# Patient Record
Sex: Female | Born: 1970 | Race: White | Hispanic: No | Smoking: Never smoker
Health system: Southern US, Community
[De-identification: ages and names within clinical notes are randomized; demographics above are authoritative.]

## PROBLEM LIST (undated history)

## (undated) DIAGNOSIS — Z87442 Personal history of urinary calculi: Secondary | ICD-10-CM

## (undated) DIAGNOSIS — N200 Calculus of kidney: Secondary | ICD-10-CM

## (undated) DIAGNOSIS — G90529 Complex regional pain syndrome I of unspecified lower limb: Secondary | ICD-10-CM

## (undated) DIAGNOSIS — F32A Depression, unspecified: Secondary | ICD-10-CM

## (undated) DIAGNOSIS — G9050 Complex regional pain syndrome I, unspecified: Secondary | ICD-10-CM

## (undated) DIAGNOSIS — F419 Anxiety disorder, unspecified: Secondary | ICD-10-CM

## (undated) DIAGNOSIS — G43909 Migraine, unspecified, not intractable, without status migrainosus: Secondary | ICD-10-CM

## (undated) DIAGNOSIS — Z9889 Other specified postprocedural states: Secondary | ICD-10-CM

## (undated) DIAGNOSIS — T753XXA Motion sickness, initial encounter: Secondary | ICD-10-CM

## (undated) DIAGNOSIS — IMO0002 Reserved for concepts with insufficient information to code with codable children: Secondary | ICD-10-CM

## (undated) DIAGNOSIS — K219 Gastro-esophageal reflux disease without esophagitis: Secondary | ICD-10-CM

## (undated) DIAGNOSIS — I7300 Raynaud's syndrome without gangrene: Secondary | ICD-10-CM

## (undated) DIAGNOSIS — R112 Nausea with vomiting, unspecified: Secondary | ICD-10-CM

## (undated) DIAGNOSIS — M5135 Other intervertebral disc degeneration, thoracolumbar region: Secondary | ICD-10-CM

## (undated) DIAGNOSIS — S129XXA Fracture of neck, unspecified, initial encounter: Secondary | ICD-10-CM

## (undated) DIAGNOSIS — G4730 Sleep apnea, unspecified: Secondary | ICD-10-CM

## (undated) DIAGNOSIS — F4310 Post-traumatic stress disorder, unspecified: Secondary | ICD-10-CM

## (undated) DIAGNOSIS — M5481 Occipital neuralgia: Secondary | ICD-10-CM

## (undated) DIAGNOSIS — I10 Essential (primary) hypertension: Secondary | ICD-10-CM

## (undated) DIAGNOSIS — F329 Major depressive disorder, single episode, unspecified: Secondary | ICD-10-CM

## (undated) DIAGNOSIS — I499 Cardiac arrhythmia, unspecified: Secondary | ICD-10-CM

## (undated) HISTORY — DX: Complex regional pain syndrome I, unspecified: G90.50

## (undated) HISTORY — DX: Reserved for concepts with insufficient information to code with codable children: IMO0002

## (undated) HISTORY — PX: BREAST SURGERY: SHX581

## (undated) HISTORY — PX: BREAST ENHANCEMENT SURGERY: SHX7

## (undated) HISTORY — DX: Calculus of kidney: N20.0

## (undated) HISTORY — DX: Sleep apnea, unspecified: G47.30

## (undated) HISTORY — DX: Major depressive disorder, single episode, unspecified: F32.9

## (undated) HISTORY — DX: Anxiety disorder, unspecified: F41.9

## (undated) HISTORY — DX: Depression, unspecified: F32.A

## (undated) HISTORY — DX: Migraine, unspecified, not intractable, without status migrainosus: G43.909

## (undated) HISTORY — DX: Post-traumatic stress disorder, unspecified: F43.10

## (undated) HISTORY — PX: ABDOMINAL HYSTERECTOMY: SHX81

## (undated) HISTORY — PX: CHOLECYSTECTOMY: SHX55

## (undated) NOTE — *Deleted (*Deleted)
Jill Rivas    CSN: [id number] Arrival date & time: 07/16/20  1355      History   Chief Complaint Chief Complaint  Patient presents with  . Cough  . Nasal Congestion    HPI Jill Rivas is a 48 y.o. female.   HPI  Past Medical History:  Diagnosis Date  . Anxiety   . Bilateral occipital neuralgia   . Compression fracture of cervical vertebra (HCC) 2018  . CRPS (complex regional pain syndrome type I) 2019  . CRPS (complex regional pain syndrome)   . DDD (degenerative disc disease), thoracolumbar 2019  . Depression   . Dysrhythmia    tacchycardia and bradycardia  . GERD (gastroesophageal reflux disease)   . History of kidney stones   . Hypertension 2019   blood pressure elevated due to anxiety  . Kidney stones   . Migraines 2019   complicated migraines can go into seizure like activity.  inderal and sleep help.  . Motion sickness    car sick  . PONV (postoperative nausea and vomiting)    some nausea after surgery  . PTSD (post-traumatic stress disorder)   . Raynaud's disease   . Reflex sympathetic dystrophy    left foot  . Reflex sympathetic dystrophy of lower limb    foot  . Sleep apnea 2017   tested again approx 1 month ago and sleep apnea has resolved.    Patient Active Problem List   Diagnosis Date Noted  . Hypotension 02/09/2020  . Palpitations 02/09/2020  . Status post laparoscopic adhesiolysis 11/08/2017  . Vaginal bleeding 11/07/2017  . Post-operative state 10/26/2017  . Moderate episode of recurrent major depressive disorder (HCC)   . Chest pain 07/10/2017  . Bilateral occipital neuralgia 01/10/2016  . Cervical disc disorder with radiculopathy of cervical region 02/27/2015  . 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 DDD (degenerative disc disease), cervical with radiculopathy 01/23/2015  . CRPS 1 (complex regional pain syndrome I) of lower limb 01/23/2015  . DDD (degenerative disc disease), lumbosacral 01/23/2015    Past  Surgical History:  Procedure Laterality Date  . ABDOMINAL HYSTERECTOMY    . AUGMENTATION MAMMAPLASTY Bilateral 2005   SALINE  . BREAST ENHANCEMENT SURGERY Bilateral   . BREAST SURGERY    . CHOLECYSTECTOMY    . COLONOSCOPY WITH PROPOFOL N/A 10/28/2019   Procedure: COLONOSCOPY WITH PROPOFOL;  Surgeon: Wyline Mood, MD;  Location: [facility];  Service: Endoscopy;  Laterality: N/A;  . FOOT SURGERY Left 1993   to free the nerve in ankle  . 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 LAPAROSCOPY N/A 11/08/2017   Procedure: LAPAROSCOPY DIAGNOSTIC;  Surgeon: Herold Harms, MD;  Location: [facility];  Service: Gynecology;  Laterality: N/A;  . POLYPECTOMY  10/28/2019   Procedure: POLYPECTOMY;  Surgeon: Wyline Mood, MD;  Location: [facility];  Service: Endoscopy;;  . REPAIR VAGINAL CUFF N/A 10/26/2017   Procedure: REPAIR VAGINAL CUFF;  Surgeon: Linzie Collin, MD;  Location: [facility];  Service: Gynecology;  Laterality: N/A;    OB History    Gravida  0   Para  0   Term  0   Preterm  0   AB  0   Living  0     SAB  0   TAB  0   Ectopic  0   Multiple  0   Live Births  0  Home Medications    Prior to Admission medications   Medication Sig Start Date End Date Taking? Authorizing Provider  cetirizine (ZYRTEC) 10 MG tablet Take 10 mg by mouth at bedtime.  09/28/17   [provider]  diclofenac sodium (VOLTAREN) 1 % GEL Apply 2-4 g to painful area of skin 4 times per day if tolerated. Patient with history of strain, sprain, and muscle spasms of extremities 05/15/16   Ewing Schlein, MD  dicyclomine (BENTYL) 20 MG tablet Take 20 mg by mouth 4 (four) times daily as needed. 09/23/19   [provider]  DULoxetine (CYMBALTA) 30 MG capsule Take 30-60 mg by mouth See admin instructions. Take 60 mg by mouth in the morning and 30 mg by mouth at bedtime. 06/23/17   [provider]   estradiol (ESTRACE) 1 MG tablet TAKE 1 & 1/2 TABLETS (1.5 MG TOTAL) BY MOUTH DAILY. 07/18/19   Linzie Collin, MD  mirtazapine (REMERON) 15 MG tablet Take 15 mg by mouth at bedtime.    [provider]  omeprazole (PRILOSEC) 40 MG capsule TAKE 1 TABLET BY MOUTH DAILY AT BEDTIME FOR REFLUX 02/08/17   [provider]  ondansetron (ZOFRAN-ODT) 4 MG disintegrating tablet Take 4 mg by mouth every 8 (eight) hours as needed. 09/02/19   [provider]  Oxycodone HCl 10 MG TABS Limit 4-6 tablets po daily if tolerated Patient taking differently: Take 10 mg by mouth every 4 (four) hours.  05/15/16   Ewing Schlein, MD  polyethylene glycol powder (GLYCOLAX/MIRALAX) 17 GM/SCOOP powder Please take 34 grams for 7 days before your prep. Start medication on 10/16/2019 10/06/19   Pasty Spillers, MD  pravastatin (PRAVACHOL) 20 MG tablet Take 20 mg by mouth at bedtime.    [provider]  prazosin (MINIPRESS) 1 MG capsule Take 1-2 mg by mouth See admin instructions. Take 1 mg by mouth twice daily and 2 mg at bedtime    [provider]  propranolol ER (INDERAL LA) 120 MG 24 hr capsule Take 120 mg by mouth at bedtime. 09/08/19   [provider]  tiZANidine (ZANAFLEX) 4 MG tablet Take 6-8 mg by mouth See admin instructions. Take 6 mg by mouth twice daily and 8 mg at bedtime 06/24/17   [provider]  topiramate (TOPAMAX) 50 MG tablet Limit 1-2 tabs by mouth twice a day if tolerated Patient taking differently: Take 100 mg by mouth 2 (two) times daily.  04/15/16   Ewing Schlein, MD  triamcinolone (KENALOG) 0.025 % cream APPLY TO AFFECTED AREA TWICE DAILY AS NEEDED FOR ECZEMA 06/22/17   [provider]    Family History Family History  Problem Relation Age of Onset  . Cancer Mother   . Breast cancer Mother   . Colon cancer Mother   . Colon polyps Mother   . Heart attack Mother 2  . Heart attack Father 51  . Colon polyps Paternal Aunt    . Heart attack Paternal Grandfather     Social History Social History   Tobacco Use  . Smoking status: Never Smoker  . Smokeless tobacco: Never Used  Vaping Use  . Vaping Use: Never used  Substance Use Topics  . Alcohol use: No    Alcohol/week: 0.0 standard drinks  . Drug use: No     Allergies   Goat-derived products, Other, and Adhesive [tape]   Review of Systems Review of Systems   Physical Exam Triage Vital Signs ED Triage Vitals  Enc Vitals  Group     BP 07/16/20 1515 (!) 138/92     Pulse Rate 07/16/20 1515 (!) 115     Resp 07/16/20 1515 18     Temp 07/16/20 1515 98.2 F (36.8 C)     Temp Source 07/16/20 1515 Oral     SpO2 07/16/20 1515 94 %     Weight --      Height --      Head Circumference --      Peak Flow --      Pain Score 07/16/20 1514 1     Pain Loc --      Pain Edu? --      Excl. in GC? --    No data found.  Updated Vital Signs BP (!) 138/92 (BP Location: Left Arm)   Pulse (!) 115   Temp 98.2 F (36.8 C) (Oral)   Resp 18   LMP 09/30/2017 (Approximate)   SpO2 94%   Visual Acuity Right Eye Distance:   Left Eye Distance:   Bilateral Distance:    Right Eye Near:   Left Eye Near:    Bilateral Near:     Physical Exam   UC Treatments / Results  Labs (all labs ordered are listed, but only abnormal results are displayed) Labs Reviewed - No data to display  EKG   Radiology No results found.  Procedures Procedures (including critical care time)  Medications Ordered in UC Medications - No data to display  Initial Impression / Assessment and Plan / UC Course  I have reviewed the triage vital signs and the nursing notes.  Pertinent labs & imaging results that were available during my care of the patient were reviewed by me and considered in my medical decision making (see chart for details).     *** Final Clinical Impressions(s) / UC Diagnoses   Final diagnoses:  Upper respiratory tract infection, unspecified type   Acute bronchitis, unspecified organism  Cough  Wheezing  Other fatigue   Discharge Instructions   None    ED Prescriptions    None     PDMP not reviewed this encounter.

---

## 1991-09-23 HISTORY — PX: FOOT SURGERY: SHX648

## 2003-09-23 HISTORY — PX: AUGMENTATION MAMMAPLASTY: SUR837

## 2003-09-23 HISTORY — PX: KIDNEY SURGERY: SHX687

## 2004-06-10 ENCOUNTER — Other Ambulatory Visit: Payer: Self-pay

## 2006-02-27 ENCOUNTER — Ambulatory Visit: Payer: Self-pay | Admitting: Family Medicine

## 2006-03-16 ENCOUNTER — Ambulatory Visit: Payer: Self-pay | Admitting: Family Medicine

## 2006-06-02 ENCOUNTER — Ambulatory Visit: Payer: Self-pay | Admitting: Pain Medicine

## 2006-06-08 ENCOUNTER — Ambulatory Visit: Payer: Self-pay | Admitting: Pain Medicine

## 2006-07-14 ENCOUNTER — Ambulatory Visit: Payer: Self-pay | Admitting: Pain Medicine

## 2006-07-20 ENCOUNTER — Ambulatory Visit: Payer: Self-pay | Admitting: Pain Medicine

## 2006-08-25 ENCOUNTER — Ambulatory Visit: Payer: Self-pay | Admitting: Pain Medicine

## 2006-08-31 ENCOUNTER — Ambulatory Visit: Payer: Self-pay | Admitting: Pain Medicine

## 2009-11-16 ENCOUNTER — Ambulatory Visit: Payer: Self-pay | Admitting: Internal Medicine

## 2010-02-21 ENCOUNTER — Ambulatory Visit: Payer: Self-pay | Admitting: Family Medicine

## 2010-04-27 ENCOUNTER — Emergency Department: Payer: Self-pay | Admitting: Emergency Medicine

## 2010-04-29 ENCOUNTER — Emergency Department: Payer: Self-pay | Admitting: Emergency Medicine

## 2011-12-11 ENCOUNTER — Ambulatory Visit: Payer: Self-pay | Admitting: Pain Medicine

## 2011-12-29 ENCOUNTER — Ambulatory Visit: Payer: Self-pay | Admitting: Pain Medicine

## 2012-01-08 ENCOUNTER — Ambulatory Visit: Payer: Self-pay | Admitting: Pain Medicine

## 2012-01-14 ENCOUNTER — Ambulatory Visit: Payer: Self-pay | Admitting: Pain Medicine

## 2012-02-05 ENCOUNTER — Ambulatory Visit: Payer: Self-pay | Admitting: Pain Medicine

## 2012-02-11 ENCOUNTER — Ambulatory Visit: Payer: Self-pay | Admitting: Pain Medicine

## 2012-03-09 ENCOUNTER — Ambulatory Visit: Payer: Self-pay | Admitting: Pain Medicine

## 2012-03-15 ENCOUNTER — Ambulatory Visit: Payer: Self-pay | Admitting: Pain Medicine

## 2012-04-07 ENCOUNTER — Ambulatory Visit: Payer: Self-pay | Admitting: Gastroenterology

## 2012-04-20 ENCOUNTER — Ambulatory Visit: Payer: Self-pay | Admitting: Pain Medicine

## 2012-04-28 ENCOUNTER — Ambulatory Visit: Payer: Self-pay | Admitting: Pain Medicine

## 2012-05-18 ENCOUNTER — Ambulatory Visit: Payer: Self-pay | Admitting: Pain Medicine

## 2012-05-26 ENCOUNTER — Ambulatory Visit: Payer: Self-pay | Admitting: Pain Medicine

## 2012-06-15 ENCOUNTER — Ambulatory Visit: Payer: Self-pay | Admitting: Pain Medicine

## 2012-07-15 ENCOUNTER — Ambulatory Visit: Payer: Self-pay | Admitting: Pain Medicine

## 2012-07-26 ENCOUNTER — Ambulatory Visit: Payer: Self-pay | Admitting: Pain Medicine

## 2012-08-12 ENCOUNTER — Ambulatory Visit: Payer: Self-pay | Admitting: Pain Medicine

## 2012-09-13 ENCOUNTER — Ambulatory Visit: Payer: Self-pay | Admitting: Pain Medicine

## 2012-10-26 ENCOUNTER — Ambulatory Visit: Payer: Self-pay | Admitting: Pain Medicine

## 2012-12-28 ENCOUNTER — Ambulatory Visit: Payer: Self-pay | Admitting: Pain Medicine

## 2013-01-05 ENCOUNTER — Ambulatory Visit: Payer: Self-pay | Admitting: Pain Medicine

## 2013-01-25 ENCOUNTER — Ambulatory Visit: Payer: Self-pay | Admitting: Pain Medicine

## 2013-02-09 ENCOUNTER — Ambulatory Visit: Payer: Self-pay | Admitting: Pain Medicine

## 2013-02-24 ENCOUNTER — Ambulatory Visit: Payer: Self-pay | Admitting: Pain Medicine

## 2013-03-24 ENCOUNTER — Ambulatory Visit: Payer: Self-pay | Admitting: Pain Medicine

## 2013-03-30 ENCOUNTER — Ambulatory Visit: Payer: Self-pay | Admitting: Pain Medicine

## 2013-04-26 ENCOUNTER — Ambulatory Visit: Payer: Self-pay | Admitting: Pain Medicine

## 2013-05-26 ENCOUNTER — Ambulatory Visit: Payer: Self-pay | Admitting: Pain Medicine

## 2013-06-06 ENCOUNTER — Ambulatory Visit: Payer: Self-pay | Admitting: Pain Medicine

## 2013-06-23 ENCOUNTER — Ambulatory Visit: Payer: Self-pay | Admitting: Pain Medicine

## 2013-07-26 ENCOUNTER — Ambulatory Visit: Payer: Self-pay | Admitting: Pain Medicine

## 2013-08-31 ENCOUNTER — Ambulatory Visit: Payer: Self-pay | Admitting: Pain Medicine

## 2013-09-26 ENCOUNTER — Ambulatory Visit: Payer: Self-pay | Admitting: Pain Medicine

## 2013-10-19 ENCOUNTER — Ambulatory Visit: Payer: Self-pay | Admitting: Pain Medicine

## 2013-11-02 ENCOUNTER — Ambulatory Visit: Payer: Self-pay | Admitting: Pain Medicine

## 2013-11-07 ENCOUNTER — Ambulatory Visit: Payer: Self-pay | Admitting: Pain Medicine

## 2013-12-01 ENCOUNTER — Ambulatory Visit: Payer: Self-pay | Admitting: Pain Medicine

## 2014-01-04 ENCOUNTER — Ambulatory Visit: Payer: Self-pay | Admitting: Pain Medicine

## 2014-01-11 ENCOUNTER — Ambulatory Visit: Payer: Self-pay | Admitting: Pain Medicine

## 2014-01-31 ENCOUNTER — Ambulatory Visit: Payer: Self-pay | Admitting: Pain Medicine

## 2014-02-06 ENCOUNTER — Ambulatory Visit: Payer: Self-pay | Admitting: Pain Medicine

## 2014-03-01 ENCOUNTER — Ambulatory Visit: Payer: Self-pay | Admitting: Pain Medicine

## 2014-03-22 ENCOUNTER — Ambulatory Visit: Payer: Self-pay | Admitting: Pain Medicine

## 2014-03-30 ENCOUNTER — Ambulatory Visit: Payer: Self-pay | Admitting: Pain Medicine

## 2014-05-01 ENCOUNTER — Ambulatory Visit: Payer: Self-pay | Admitting: Pain Medicine

## 2014-05-23 ENCOUNTER — Ambulatory Visit: Payer: Self-pay | Admitting: Pain Medicine

## 2014-06-05 ENCOUNTER — Ambulatory Visit: Payer: Self-pay | Admitting: Pain Medicine

## 2014-06-06 ENCOUNTER — Ambulatory Visit: Payer: Self-pay | Admitting: Family Medicine

## 2014-06-26 ENCOUNTER — Ambulatory Visit: Payer: Self-pay | Admitting: Pain Medicine

## 2014-07-05 ENCOUNTER — Ambulatory Visit: Payer: Self-pay | Admitting: Pain Medicine

## 2014-07-27 ENCOUNTER — Ambulatory Visit: Payer: Self-pay | Admitting: Pain Medicine

## 2014-08-28 ENCOUNTER — Ambulatory Visit: Payer: Self-pay | Admitting: Pain Medicine

## 2014-09-26 ENCOUNTER — Ambulatory Visit: Payer: Self-pay | Admitting: Pain Medicine

## 2014-10-04 ENCOUNTER — Ambulatory Visit: Payer: Self-pay | Admitting: Pain Medicine

## 2014-10-26 ENCOUNTER — Ambulatory Visit: Payer: Self-pay | Admitting: Pain Medicine

## 2014-11-22 ENCOUNTER — Ambulatory Visit: Payer: Self-pay | Admitting: Pain Medicine

## 2014-12-26 ENCOUNTER — Ambulatory Visit
Admit: 2014-12-26 | Disposition: A | Discharge status: Home / Self Care | Payer: Self-pay | Attending: Pain Medicine | Admitting: Pain Medicine

## 2015-01-09 ENCOUNTER — Ambulatory Visit
Admit: 2015-01-09 | Disposition: A | Discharge status: Home / Self Care | Payer: Self-pay | Attending: Pain Medicine | Admitting: Pain Medicine

## 2015-01-23 ENCOUNTER — Encounter: Payer: Self-pay | Admitting: Pain Medicine

## 2015-01-23 ENCOUNTER — Encounter (INDEPENDENT_AMBULATORY_CARE_PROVIDER_SITE_OTHER): Payer: Self-pay

## 2015-01-23 ENCOUNTER — Ambulatory Visit: Payer: Medicare Other | Attending: Pain Medicine | Admitting: Pain Medicine

## 2015-01-23 VITALS — BP 111/82 | HR 88 | Temp 98.1°F | Resp 16 | Ht 67.0 in | Wt 192.0 lb

## 2015-01-23 DIAGNOSIS — G90529 Complex regional pain syndrome I of unspecified lower limb: Secondary | ICD-10-CM | POA: Diagnosis not present

## 2015-01-23 DIAGNOSIS — G90522 Complex regional pain syndrome I of left lower limb: Secondary | ICD-10-CM

## 2015-01-23 DIAGNOSIS — M542 Cervicalgia: Secondary | ICD-10-CM | POA: Insufficient documentation

## 2015-01-23 DIAGNOSIS — M5137 Other intervertebral disc degeneration, lumbosacral region: Secondary | ICD-10-CM

## 2015-01-23 DIAGNOSIS — M545 Low back pain: Secondary | ICD-10-CM | POA: Diagnosis present

## 2015-01-23 DIAGNOSIS — M51379 Other intervertebral disc degeneration, lumbosacral region without mention of lumbar back pain or lower extremity pain: Secondary | ICD-10-CM | POA: Insufficient documentation

## 2015-01-23 DIAGNOSIS — M5030 Other cervical disc degeneration, unspecified cervical region: Secondary | ICD-10-CM

## 2015-01-23 MED ORDER — TOPIRAMATE 50 MG PO TABS: 50.0000 mg | ORAL_TABLET | Freq: Two times a day (BID) | ORAL | Status: DC

## 2015-01-23 MED ORDER — OXYCODONE HCL 10 MG PO TABS: ORAL_TABLET | ORAL | Status: DC

## 2015-01-23 NOTE — Patient Instructions (Signed)
Epidural Steroid Injection Patient Information  Description: The epidural space surrounds the nerves as they exit the spinal cord.  In some patients, the nerves can be compressed and inflamed by a bulging disc or a tight spinal canal (spinal stenosis).  By injecting steroids into the epidural space, we can bring irritated nerves into direct contact with a potentially helpful medication.  These steroids act directly on the irritated nerves and can reduce swelling and inflammation which often leads to decreased pain.  Epidural steroids may be injected anywhere along the spine and from the neck to the low back depending upon the location of your pain.   After numbing the skin with local anesthetic (like Novocaine), a small needle is passed into the epidural space slowly.  You may experience a sensation of pressure while this is being done.  The entire block usually last less than 10 minutes.  Conditions which may be treated by epidural steroids:   Low back and leg pain  Neck and arm pain  Spinal stenosis  Post-laminectomy syndrome  Herpes zoster (shingles) pain  Pain from compression fractures  Preparation for the injection:  1. Do not eat any solid food or dairy products within 6 hours of your appointment.  2. You may drink clear liquids up to 2 hours before appointment.  Clear liquids include water, black coffee, juice or soda.  No milk or cream please. 3. You may take your regular medication, including pain medications, with a sip of water before your appointment  Diabetics should hold regular insulin (if taken separately) and take 1/2 normal NPH dos the morning of the procedure.  Carry some sugar containing items with you to your appointment. 4. A driver must accompany you and be prepared to drive you home after your procedure.  5. Bring all your current medications with your. 6. An IV may be inserted and sedation may be given at the discretion of the physician.   7. A blood pressure  cuff, EKG and other monitors will often be applied during the procedure.  Some patients may need to have extra oxygen administered for a short period. 8. You will be asked to provide medical information, including your allergies, prior to the procedure.  We must know immediately if you are taking blood thinners (like Coumadin/Warfarin)  Or if you are allergic to IV iodine contrast (dye). We must know if you could possible be pregnant.  Possible side-effects:  Bleeding from needle site  Infection (rare, may require surgery)  Nerve injury (rare)  Numbness & tingling (temporary)  Difficulty urinating (rare, temporary)  Spinal headache ( a headache worse with upright posture)  Light -headedness (temporary)  Pain at injection site (several days)  Decreased blood pressure (temporary)  Weakness in arm/leg (temporary)  Pressure sensation in back/neck (temporary)  Call if you experience:  Fever/chills associated with headache or increased back/neck pain.  Headache worsened by an upright position.  New onset weakness or numbness of an extremity below the injection site  Hives or difficulty breathing (go to the emergency room)  Inflammation or drainage at the infection site  Severe back/neck pain  Any new symptoms which are concerning to you  Teach back 3  Oxycodone script given  Topamax script sent to pharmacy  Please note:  Although the local anesthetic injected can often make your back or neck feel good for several hours after the injection, the pain will likely return.  It takes 3-7 days for steroids to work in the epidural space.  You may not notice any pain relief for at least that one week.  If effective, we will often do a series of three injections spaced 3-6 weeks apart to maximally decrease your pain.  After the initial series, we generally will wait several months before considering a repeat injection of the same type.  If you have any questions, please call  [phone] [facility]

## 2015-01-23 NOTE — Progress Notes (Signed)
   Subjective:    Patient ID: Jill Rivas, female    DOB: Feb 08, 1971, 44 y.o.   MRN: [id number]  HPI  Patient is a 44 year old female returns to pain management Center for follow-up evaluation and treatment of lower back lower extremity pain felt to be due to component of complex regional pain syndrome. At the present time patient has complaint of pain of the cervical region and upper extremity region with questionable weakness of the upper extremity. We have discussed patient undergoing cervical epidural steroid injection and a neurosurgical evaluation. We will proceed with cervical epidural steroid injection and have scheduled neurosurgical evaluation the patient is understanding and wishes to proceed with cervical epidural steroid injection at time return appointment in attempt to decrease severity of symptoms and minimize progression of her condition  Review of Systems     Objective:   Physical Exam Physical examination revealed tenderness to palpation of the cervical paraspinal musculature region with limited range of motion of the cervical spine and questionably decreased grip strength there was no excessive tends to palpation of the acromioclavicular O glenohumeral joint region. There was tenderness over the thoracic paraspinal musculature region of mild degree. With no crepitus of the thoracic region noted there was tenderness to palpation of the paraspinal musculature region of the lumbar region of mild degree. There was mild tenderness over the lumbar facet and lumbar paraspinal musculature region. The patient was with allodynia of the left lower extremity with a slight cyanotic hue of the left lower extremity noted as well. No costovertebral angle tenderness was noted. No costovertebral angle tenderness was noted no abdominal tenderness was noted.       Assessment & Plan:  Patient with pain of the cervical region with findings suggestive of cervical radiculopathy with MRI  findings of disc protrusion and foraminal narrowing we'll proceed with cervical epidural steroid injection at time return appointment. Patient with lumbar and lower extremity pain with lower extremity pain felt to be due to complex regional pain syndrome. Plan #1 continue oxycodone and Topamax Plan #2 will proceed with cervical epidural steroid injection at time return appointment in attempt to decrease severity of symptoms minimize progression of patient's symptoms and hopefully avoid the need for more involved treatment Plan #3 patient is to follow-up with primary care physician is discussed Plan #4 neurosurgical evaluation scheduled Plan #5 may consider radiofrequency rhizolysis and other treatment pending follow-up evaluation M response to present treatment Plan #6 patient to call pain management Center prior to scheduled return appointment should they be significant change in condition

## 2015-01-24 NOTE — Addendum Note (Signed)
Addended by: Morley Kos on: 01/24/2015 04:11 PM   Modules accepted: Level of Service

## 2015-02-04 ENCOUNTER — Other Ambulatory Visit: Payer: Self-pay | Admitting: Pain Medicine

## 2015-02-04 ENCOUNTER — Encounter: Payer: Self-pay | Admitting: Pain Medicine

## 2015-02-04 DIAGNOSIS — M5030 Other cervical disc degeneration, unspecified cervical region: Secondary | ICD-10-CM

## 2015-02-04 DIAGNOSIS — M5010 Cervical disc disorder with radiculopathy, unspecified cervical region: Secondary | ICD-10-CM

## 2015-02-05 ENCOUNTER — Encounter: Payer: Self-pay | Admitting: Pain Medicine

## 2015-02-05 ENCOUNTER — Ambulatory Visit: Payer: Medicare Other | Attending: Pain Medicine | Admitting: Pain Medicine

## 2015-02-05 VITALS — BP 96/34 | HR 73 | Temp 98.6°F | Resp 16 | Ht 67.0 in | Wt 192.0 lb

## 2015-02-05 DIAGNOSIS — M542 Cervicalgia: Secondary | ICD-10-CM | POA: Diagnosis present

## 2015-02-05 DIAGNOSIS — M5030 Other cervical disc degeneration, unspecified cervical region: Secondary | ICD-10-CM | POA: Diagnosis not present

## 2015-02-05 DIAGNOSIS — M79601 Pain in right arm: Secondary | ICD-10-CM | POA: Insufficient documentation

## 2015-02-05 DIAGNOSIS — M5010 Cervical disc disorder with radiculopathy, unspecified cervical region: Secondary | ICD-10-CM

## 2015-02-05 DIAGNOSIS — M79602 Pain in left arm: Secondary | ICD-10-CM | POA: Diagnosis present

## 2015-02-05 MED ORDER — FENTANYL CITRATE (PF) 100 MCG/2ML IJ SOLN
INTRAMUSCULAR | Status: AC
  Administered 2015-02-05: 100 ug via INTRAVENOUS
  Administered 2015-02-05: 14:00:00
  Filled 2015-02-05: qty 2

## 2015-02-05 MED ORDER — TRIAMCINOLONE ACETONIDE 40 MG/ML IJ SUSP
INTRAMUSCULAR | Status: AC
  Administered 2015-02-05: 40 mg
  Filled 2015-02-05: qty 1

## 2015-02-05 MED ORDER — ORPHENADRINE CITRATE 30 MG/ML IJ SOLN
INTRAMUSCULAR | Status: AC
  Administered 2015-02-05: 60 mg
  Filled 2015-02-05: qty 2

## 2015-02-05 MED ORDER — MIDAZOLAM HCL 5 MG/5ML IJ SOLN
INTRAMUSCULAR | Status: AC
  Administered 2015-02-05: 2 mg via INTRAVENOUS
  Filled 2015-02-05: qty 5

## 2015-02-05 MED ORDER — SODIUM CHLORIDE 0.9 % IJ SOLN
INTRAMUSCULAR | Status: AC
  Administered 2015-02-05: 20 mL
  Filled 2015-02-05: qty 20

## 2015-02-05 MED ORDER — LIDOCAINE HCL (PF) 1 % IJ SOLN
INTRAMUSCULAR | Status: AC
  Administered 2015-02-05: 14:00:00
  Filled 2015-02-05: qty 5

## 2015-02-05 MED ORDER — BUPIVACAINE HCL (PF) 0.25 % IJ SOLN
INTRAMUSCULAR | Status: AC
  Administered 2015-02-05: 14:00:00
  Filled 2015-02-05: qty 30

## 2015-02-05 NOTE — Progress Notes (Signed)
Discharge to home via wheelchair with [place]. Instruction given. Verbalizes understanding. Tolerating liquids.

## 2015-02-05 NOTE — Progress Notes (Signed)
   Subjective:    Patient ID: Jill Rivas, female    DOB: [birth year], 44 y.o.   MRN: [id number]  HPI    Review of Systems     Objective:   Physical Exam        Assessment & Plan:

## 2015-02-05 NOTE — Progress Notes (Signed)
PROCEDURE PERFORMED: Cervical epidural steroid injection   NOTE: The patient is a 44 y.o.-year-old female who returns to [facility] for further evaluation and prescription of pain involving the neck and upper extremity region. Cervical MRI has revealed the patient to be with evidence of degenerative changes of the cervical spine most notable at C5-6 with foraminal narrowing and other changes.. The risks, benefits, and expectations of the procedure have been discussed and explained to the patient who was understanding and wished to proceed with interventional treatment as planned. Will proceed with interventional treatment as discussed and as explained to the patient.  DESCRIPTION OF PROCEDURE: Cervical epidural steroid injection with IV Versed, IV fentanyl conscious sedation, EKG, blood pressure, pulse, and pulse oximetry monitoring. The procedure was performed with the patient in the prone position under fluoroscopic guidance. With AP view of the cervical spine, the patient in the prone position, Betadine prep of proposed entry site was performed. Following local anesthetic skin wheal of 1.5% plain lidocaine of proposed needle entry site, 18-gauge Tuohy epidural needle inserted via hanging drop technique at the C 6 vertebral body level with needle placed left of the midline under fluoroscopic guidance. Following needle placement under fluoroscopic guidance, a total of 4 mL of Preservative-Free normal saline with 40 mg of Kenalog injected incrementally via cervical epidural placed needle. Needle removed. The patient tolerated the injection well.   PLAN:   1. Medications: Will continue presently prescribed medications consisting of Topamax and oxycodone. 2. Will consider modification of treatment regimen at time of return appointment pending response to treatment rendered on today's visit. 3. The patient is to follow-up with primary care physician, Dr. Delight Stare, for evaluation of blood  pressure and general medical condition status post steroid injection performed on today's visit. 4. Surgical evaluation as scheduled. 5. Neurological evaluation as discussed 6    The patient is to call pain management for any change in condition prior to return appointment     6. May consider radiofrequency procedure, implantation device, and other treatment pending response to treatment and follow-up evaluation. 7. The patient has been advised to adhere to proper body mechanics and avoid activities which appear to aggravate condition. 8. The patient has been advised to call the Pain Management Center prior to scheduled return appointment should there be significant change in condition or should patient have other concerns regarding condition prior to scheduled return appointment.  The patient is understanding and in agreement with suggested treatment plan.

## 2015-02-05 NOTE — Patient Instructions (Addendum)
Continue present medications and antibiotics.  F/U PCP for evaliation of  BP and general medical  condition.  Nurse's please schedule neurosurgical eval of pain of the cervical region and upper extremity pain and weakness  F/U surgical evaluation.  F/U nrurological evaluation.  May consider radiofrequency rhizolysis or intraspinal procedures pending response to present treatment and F/U evaluation.  Patient to call Pain Management Center should patient have concerns prior to scheduled return appointment.    Pain Management Discharge Instructions  General Discharge Instructions :  If you need to reach your doctor call: Monday-Friday 8:00 am - 4:00 pm at [phone] or toll free [phone].  After clinic hours [phone] to have operator reach doctor.  Bring all of your medication bottles to all your appointments in the pain clinic.  To cancel or reschedule your appointment with Pain Management please remember to call 24 hours in advance to avoid a fee.  Refer to the educational materials which you have been given on: General Risks, I had my Procedure. Discharge Instructions, Post Sedation.  Post Procedure Instructions:  The drugs you were given will stay in your system until tomorrow, so for the next 24 hours you should not drive, make any legal decisions or drink any alcoholic beverages.  You may eat anything you prefer, but it is better to start with liquids then soups and crackers, and gradually work up to solid foods.  Please notify your doctor immediately if you have any unusual bleeding, trouble breathing or pain that is not related to your normal pain.  Depending on the type of procedure that was done, some parts of your body may feel week and/or numb.  This usually clears up by tonight or the next day.  Walk with the use of an assistive device or accompanied by an adult for the 24 hours.  You may use ice on the affected area for the first 24 hours.  Put ice in a  Ziploc bag and cover with a towel and place against area 15 minutes on 15 minutes off.  You may switch to heat after 24 hours.

## 2015-02-05 NOTE — Progress Notes (Signed)
1224 Procedure start time 1234 Procedure end time Report to D. Merrilyn Puma

## 2015-02-05 NOTE — Progress Notes (Signed)
   Subjective:    Patient ID: Jill Rivas, female    DOB: 1971-09-11, 44 y.o.   MRN: [id number]  HPI    Review of Systems     Objective:   Physical Exam        Assessment & Plan:

## 2015-02-05 NOTE — Progress Notes (Signed)
Discharged via w/c at 1:15 pm. Tolerating po fluids well. Instructions (verbal and written) given to patient. Teachback x3.

## 2015-02-06 ENCOUNTER — Telehealth: Payer: Self-pay | Admitting: *Deleted

## 2015-02-06 NOTE — Telephone Encounter (Signed)
Left message to call for any concerns or problems

## 2015-02-06 NOTE — Telephone Encounter (Signed)
closed

## 2015-02-08 ENCOUNTER — Telehealth: Payer: Self-pay | Admitting: Pain Medicine

## 2015-02-08 NOTE — Telephone Encounter (Signed)
Inquiring about Neuro Surgeon Appt.

## 2015-02-20 ENCOUNTER — Telehealth: Payer: Self-pay | Admitting: Pain Medicine

## 2015-02-20 NOTE — Telephone Encounter (Signed)
Inquiring about Neuro referral / she hasn't gotten a call

## 2015-02-25 ENCOUNTER — Other Ambulatory Visit: Payer: Self-pay | Admitting: Pain Medicine

## 2015-02-25 DIAGNOSIS — G90522 Complex regional pain syndrome I of left lower limb: Secondary | ICD-10-CM

## 2015-02-25 DIAGNOSIS — M5030 Other cervical disc degeneration, unspecified cervical region: Secondary | ICD-10-CM

## 2015-02-25 DIAGNOSIS — M5137 Other intervertebral disc degeneration, lumbosacral region: Secondary | ICD-10-CM

## 2015-02-25 DIAGNOSIS — M533 Sacrococcygeal disorders, not elsewhere classified: Secondary | ICD-10-CM | POA: Insufficient documentation

## 2015-02-27 ENCOUNTER — Encounter: Payer: Self-pay | Admitting: Pain Medicine

## 2015-02-27 ENCOUNTER — Ambulatory Visit: Payer: Medicare Other | Attending: Pain Medicine | Admitting: Pain Medicine

## 2015-02-27 VITALS — BP 130/93 | HR 98 | Temp 97.8°F | Resp 18 | Ht 67.0 in | Wt 192.0 lb

## 2015-02-27 DIAGNOSIS — M5412 Radiculopathy, cervical region: Secondary | ICD-10-CM | POA: Diagnosis not present

## 2015-02-27 DIAGNOSIS — M5030 Other cervical disc degeneration, unspecified cervical region: Secondary | ICD-10-CM

## 2015-02-27 DIAGNOSIS — M5010 Cervical disc disorder with radiculopathy, unspecified cervical region: Secondary | ICD-10-CM

## 2015-02-27 DIAGNOSIS — M79602 Pain in left arm: Secondary | ICD-10-CM | POA: Diagnosis present

## 2015-02-27 DIAGNOSIS — G90522 Complex regional pain syndrome I of left lower limb: Secondary | ICD-10-CM | POA: Diagnosis not present

## 2015-02-27 DIAGNOSIS — Z9889 Other specified postprocedural states: Secondary | ICD-10-CM | POA: Diagnosis not present

## 2015-02-27 DIAGNOSIS — M5137 Other intervertebral disc degeneration, lumbosacral region: Secondary | ICD-10-CM

## 2015-02-27 DIAGNOSIS — M533 Sacrococcygeal disorders, not elsewhere classified: Secondary | ICD-10-CM

## 2015-02-27 DIAGNOSIS — M542 Cervicalgia: Secondary | ICD-10-CM | POA: Diagnosis present

## 2015-02-27 DIAGNOSIS — M79601 Pain in right arm: Secondary | ICD-10-CM | POA: Diagnosis present

## 2015-02-27 MED ORDER — TOPIRAMATE 50 MG PO TABS: ORAL_TABLET | ORAL | Status: DC

## 2015-02-27 MED ORDER — OXYCODONE HCL 10 MG PO TABS: ORAL_TABLET | ORAL | Status: DC

## 2015-02-27 NOTE — Progress Notes (Signed)
Safety precautions to be maintained throughout the outpatient stay will include: orient to surroundings, keep bed in low position, maintain call bell within reach at all times, provide assistance with transfer out of bed and ambulation.  

## 2015-02-27 NOTE — Progress Notes (Signed)
   Subjective:    Patient ID: Jill Rivas, female    DOB: March 24, 1971, 44 y.o.   MRN: [id number]  HPI  Patient is 44 year old female returns to [place] for further evaluation and treatment of pain involving the neck and upper extremity regions associated with weakness of the left upper extremity. Patient had improvement of her pain following previous cervical epidural steroid injection. On today's visit we informed patient wishes to avoid interventional treatment and have patient undergo's neurosurgical evaluation of her pain of the cervical region and upper extremity region associated with weakness. Patient was understanding and agreement status treatment plan. Patient states that her lower extremity pain felt to be component of complex regional pain syndrome is fairly well-controlled at this time. For neurosurgical evaluation of pain of the cervical and upper extremity region with upper extremity weakness as discussed and explained to patient today's visit. All understanding and agrees to treatment plan    Review of Systems     Objective:   Physical Exam  There was tends to palpation spends And occipitalis musculature regions of moderate degree with interventional range of motion of the cervical spine noted. No definite findings were noted with Spurling's maneuver. There appeared to be decreased grip strength of significant degree on the left compared to the right there was minimal tenderness of the acromioclavicular glenohumeral joint region. Tinel and Phalen's maneuver without without increased pain of any significant degree. Palpation of the thoracic facet thoracic paraspinal musculature region was associated tends to palpation of moderate degree.  Moderate muscle spasms noted upper mid and lower thoracic regions. Palpation of the lumbar paraspinal musculature region lumbar facet region associated regions palpation of moderate degree with lateral bending and rotations to  palpation lumbar facets reproducing moderate discomfort. There was tends to palpation of the left lower extremity with slight/cyanotic you of the left foot with areas of hypersensitivity to touch suggestive of allodynia. There was decreased EHL strength on the left compared to the right. Abdomen nontender. No costovertebral maintenance noted.      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 Oxycodone and Topamax  F/U PCP for evaliation of  BP and general medical  condition.  F/U surgical evaluation. Neurosurgical reevaluation scheduled for evaluation of pain of the cervical upper extremity region associated with upper extremity weakness especially on the left  F/U neurological evaluation.  May consider radiofrequency rhizolysis or intraspinal procedures pending response to present treatment and F/U evaluation.  Patient to call Pain Management Center should patient have concerns prior to scheduled return appointment.

## 2015-02-27 NOTE — Patient Instructions (Signed)
Continue present medications.  F/U PCP for evaliation of  BP and general medical  condition.  F/U surgical evaluation. Please take copy of MRI report as well as MRI disc of your neck to evaluation with neurosurgeon. We also scheduling you for neurosurgical evaluation at this time   F/U neurological evaluation.  May consider radiofrequency rhizolysis or intraspinal procedures pending response to present treatment and F/U evaluation.  Patient to call Pain Management Center should patient have concerns prior to scheduled return appointment.

## 2015-03-06 ENCOUNTER — Ambulatory Visit: Payer: Medicare Other | Admitting: Pain Medicine

## 2015-03-14 ENCOUNTER — Other Ambulatory Visit: Payer: Self-pay | Admitting: Family Medicine

## 2015-03-14 DIAGNOSIS — Z1231 Encounter for screening mammogram for malignant neoplasm of breast: Secondary | ICD-10-CM

## 2015-03-22 ENCOUNTER — Ambulatory Visit: Payer: Medicare Other | Attending: Family Medicine

## 2015-03-27 ENCOUNTER — Ambulatory Visit: Payer: Medicare Other | Attending: Internal Medicine

## 2015-03-27 DIAGNOSIS — G4730 Sleep apnea, unspecified: Secondary | ICD-10-CM | POA: Insufficient documentation

## 2015-03-29 ENCOUNTER — Ambulatory Visit: Payer: Medicare Other | Attending: Pain Medicine | Admitting: Pain Medicine

## 2015-03-29 ENCOUNTER — Encounter: Payer: Self-pay | Admitting: Pain Medicine

## 2015-03-29 VITALS — BP 92/69 | HR 83 | Temp 97.9°F | Resp 16 | Ht 67.0 in | Wt 195.0 lb

## 2015-03-29 DIAGNOSIS — M79601 Pain in right arm: Secondary | ICD-10-CM | POA: Diagnosis present

## 2015-03-29 DIAGNOSIS — M533 Sacrococcygeal disorders, not elsewhere classified: Secondary | ICD-10-CM

## 2015-03-29 DIAGNOSIS — M5481 Occipital neuralgia: Secondary | ICD-10-CM | POA: Insufficient documentation

## 2015-03-29 DIAGNOSIS — M79602 Pain in left arm: Secondary | ICD-10-CM | POA: Diagnosis present

## 2015-03-29 DIAGNOSIS — M5137 Other intervertebral disc degeneration, lumbosacral region: Secondary | ICD-10-CM

## 2015-03-29 DIAGNOSIS — M47812 Spondylosis without myelopathy or radiculopathy, cervical region: Secondary | ICD-10-CM | POA: Diagnosis not present

## 2015-03-29 DIAGNOSIS — M5010 Cervical disc disorder with radiculopathy, unspecified cervical region: Secondary | ICD-10-CM

## 2015-03-29 DIAGNOSIS — M5030 Other cervical disc degeneration, unspecified cervical region: Secondary | ICD-10-CM | POA: Diagnosis not present

## 2015-03-29 DIAGNOSIS — G90522 Complex regional pain syndrome I of left lower limb: Secondary | ICD-10-CM

## 2015-03-29 MED ORDER — TOPIRAMATE 50 MG PO TABS: ORAL_TABLET | ORAL | Status: DC

## 2015-03-29 MED ORDER — OXYCODONE HCL 10 MG PO TABS: ORAL_TABLET | ORAL | Status: DC

## 2015-03-29 NOTE — Progress Notes (Signed)
   Subjective:    Patient ID: Jill Rivas, female    DOB: 1971/03/16, 44 y.o.   MRN: [id number]  HPI  Patient is 44 year old female returns to [place] for further evaluation and treatment of pain involving the region of the upper extremity region. Patient is status post neurosurgical evaluation and neurosurgeon recommended patient return to pain management to undergo additional injections at this time. We will schedule cervical epidural steroid injection to be performed at time return appointment as recommended by neurosurgeon. The patient was with understanding and agreement with suggested treatment plan.       Review of Systems     Objective:   Physical Exam  There was tends to palpation of the splenius capitis and occipitalis musculature region. Range of motion maneuvers of the cervical spine associated with radiation of pain into the upper extremities questionable degree. There was questionable decreased sensation of the C5-6 dermatomal distribution of the left upper extremity. Mild tinnitus of the acromioclavicular glenohumeral joint region was noted. Palpation over the thoracic facet thoracic paraspinal musculature region was evidence of muscle spasms and upper thoracic region especially. Palpation of the lumbar paraspinal muscles lumbar facet region associated with mild to moderate discomfort. Left lower extremity was with allodynia. And with cyanotic hue. No sensory deficit of dermatomal distribution of the lower extremities noted. There was negative clonus negative Homans of the right lower extremity. The abdomen was nontender with no costovertebral tenderness noted      Assessment & Plan:  Degenerative disc disease cervical spine Degenerative changes cervical spine most notable at the C5-6 level  Complex regional pain syndrome of left lower extremity  Sacroiliac joint dysfunction  Bilateral occipital neuralgia   Plan  Continue present medications  Topamax oxycodone  Cervical epidural steroid injection to be performed at time return appointment  F/U PCP for evaliation of  BP and general medical  condition.  F/U surgical evaluation with neurosurgeon as planned  F/U neurological evaluation  May consider radiofrequency rhizolysis or intraspinal procedures pending response to present treatment and F/U evaluation.  Patient to call Pain Management Center should patient have concerns prior to scheduled return appointment.

## 2015-03-29 NOTE — Patient Instructions (Addendum)
Continue present medications Topamax and oxycodone  Cervical epidural steroid injection to be performed at time of return appointment Wednesday, 04/11/2015  F/U PCP for evaliation of  BP and general medical  condition. See primary care physician regarding blood pressure  F/U surgical evaluation. Follow-up with neurosurgeon as discussed. We will proceed with injections as recommended by the neurosurgeon   F/U neurological evaluation  May consider radiofrequency rhizolysis or intraspinal procedures pending response to present treatment and F/U evaluation.  Patient to call Pain Management Center should patient have concerns prior to scheduled return appointment. Epidural Steroid Injection Patient Information  Description: The epidural space surrounds the nerves as they exit the spinal cord.  In some patients, the nerves can be compressed and inflamed by a bulging disc or a tight spinal canal (spinal stenosis).  By injecting steroids into the epidural space, we can bring irritated nerves into direct contact with a potentially helpful medication.  These steroids act directly on the irritated nerves and can reduce swelling and inflammation which often leads to decreased pain.  Epidural steroids may be injected anywhere along the spine and from the neck to the low back depending upon the location of your pain.   After numbing the skin with local anesthetic (like Novocaine), a small needle is passed into the epidural space slowly.  You may experience a sensation of pressure while this is being done.  The entire block usually last less than 10 minutes.  Conditions which may be treated by epidural steroids:   Low back and leg pain  Neck and arm pain  Spinal stenosis  Post-laminectomy syndrome  Herpes zoster (shingles) pain  Pain from compression fractures  Preparation for the injection:  1. Do not eat any solid food or dairy products within 6 hours of your appointment.  2. You may drink  clear liquids up to 2 hours before appointment.  Clear liquids include water, black coffee, juice or soda.  No milk or cream please. 3. You may take your regular medication, including pain medications, with a sip of water before your appointment  Diabetics should hold regular insulin (if taken separately) and take 1/2 normal NPH dos the morning of the procedure.  Carry some sugar containing items with you to your appointment. 4. A driver must accompany you and be prepared to drive you home after your procedure.  5. Bring all your current medications with your. 6. An IV may be inserted and sedation may be given at the discretion of the physician.   7. A blood pressure cuff, EKG and other monitors will often be applied during the procedure.  Some patients may need to have extra oxygen administered for a short period. 8. You will be asked to provide medical information, including your allergies, prior to the procedure.  We must know immediately if you are taking blood thinners (like Coumadin/Warfarin)  Or if you are allergic to IV iodine contrast (dye). We must know if you could possible be pregnant.  Possible side-effects:  Bleeding from needle site  Infection (rare, may require surgery)  Nerve injury (rare)  Numbness & tingling (temporary)  Difficulty urinating (rare, temporary)  Spinal headache ( a headache worse with upright posture)  Light -headedness (temporary)  Pain at injection site (several days)  Decreased blood pressure (temporary)  Weakness in arm/leg (temporary)  Pressure sensation in back/neck (temporary)  Call if you experience:  Fever/chills associated with headache or increased back/neck pain.  Headache worsened by an upright position.  New onset weakness  or numbness of an extremity below the injection site  Hives or difficulty breathing (go to the emergency room)  Inflammation or drainage at the infection site  Severe back/neck pain  Any new symptoms  which are concerning to you  Please note:  Although the local anesthetic injected can often make your back or neck feel good for several hours after the injection, the pain will likely return.  It takes 3-7 days for steroids to work in the epidural space.  You may not notice any pain relief for at least that one week.  If effective, we will often do a series of three injections spaced 3-6 weeks apart to maximally decrease your pain.  After the initial series, we generally will wait several months before considering a repeat injection of the same type.  If you have any questions, please call [phone] [place] were given a script for Oxycodone A script for Topamax was e-scribed to your pharmacy

## 2015-03-29 NOTE — Progress Notes (Signed)
Safety precautions to be maintained throughout the outpatient stay will include: orient to surroundings, keep bed in low position, maintain call bell within reach at all times, provide assistance with transfer out of bed and ambulation.  

## 2015-04-11 ENCOUNTER — Ambulatory Visit: Payer: Medicare Other | Attending: Pain Medicine | Admitting: Pain Medicine

## 2015-04-11 ENCOUNTER — Encounter: Payer: Self-pay | Admitting: Pain Medicine

## 2015-04-11 VITALS — BP 109/61 | HR 72 | Temp 98.8°F | Resp 16 | Ht 67.0 in | Wt 200.0 lb

## 2015-04-11 DIAGNOSIS — M5022 Other cervical disc displacement, mid-cervical region: Secondary | ICD-10-CM | POA: Diagnosis not present

## 2015-04-11 DIAGNOSIS — M79601 Pain in right arm: Secondary | ICD-10-CM | POA: Diagnosis present

## 2015-04-11 DIAGNOSIS — M51379 Other intervertebral disc degeneration, lumbosacral region without mention of lumbar back pain or lower extremity pain: Secondary | ICD-10-CM

## 2015-04-11 DIAGNOSIS — M47812 Spondylosis without myelopathy or radiculopathy, cervical region: Secondary | ICD-10-CM | POA: Diagnosis not present

## 2015-04-11 DIAGNOSIS — M5010 Cervical disc disorder with radiculopathy, unspecified cervical region: Secondary | ICD-10-CM

## 2015-04-11 DIAGNOSIS — M5030 Other cervical disc degeneration, unspecified cervical region: Secondary | ICD-10-CM

## 2015-04-11 DIAGNOSIS — M533 Sacrococcygeal disorders, not elsewhere classified: Secondary | ICD-10-CM

## 2015-04-11 DIAGNOSIS — M79602 Pain in left arm: Secondary | ICD-10-CM | POA: Diagnosis present

## 2015-04-11 DIAGNOSIS — G90522 Complex regional pain syndrome I of left lower limb: Secondary | ICD-10-CM

## 2015-04-11 DIAGNOSIS — M5137 Other intervertebral disc degeneration, lumbosacral region: Secondary | ICD-10-CM

## 2015-04-11 MED ORDER — MIDAZOLAM HCL 5 MG/5ML IJ SOLN
INTRAMUSCULAR | Status: AC
  Administered 2015-04-11: 5 mg via INTRAVENOUS
  Filled 2015-04-11: qty 5

## 2015-04-11 MED ORDER — FENTANYL CITRATE (PF) 100 MCG/2ML IJ SOLN
INTRAMUSCULAR | Status: AC
  Administered 2015-04-11: 100 ug via INTRAVENOUS
  Filled 2015-04-11: qty 2

## 2015-04-11 MED ORDER — CEFAZOLIN SODIUM 1 G IJ SOLR
INTRAMUSCULAR | Status: AC
  Administered 2015-04-11: 1 g via INTRAVENOUS
  Filled 2015-04-11: qty 10

## 2015-04-11 MED ORDER — ORPHENADRINE CITRATE 30 MG/ML IJ SOLN
INTRAMUSCULAR | Status: AC
  Administered 2015-04-11: 60 mg
  Filled 2015-04-11: qty 2

## 2015-04-11 MED ORDER — SODIUM CHLORIDE 0.9 % IJ SOLN
INTRAMUSCULAR | Status: AC
  Administered 2015-04-11: 20 mL
  Filled 2015-04-11: qty 20

## 2015-04-11 MED ORDER — LIDOCAINE HCL (PF) 1 % IJ SOLN
INTRAMUSCULAR | Status: AC
  Administered 2015-04-11: 3 mL
  Filled 2015-04-11: qty 5

## 2015-04-11 MED ORDER — CEFUROXIME AXETIL 250 MG PO TABS: 250.0000 mg | ORAL_TABLET | Freq: Two times a day (BID) | ORAL | Status: DC

## 2015-04-11 MED ORDER — BUPIVACAINE HCL (PF) 0.25 % IJ SOLN
INTRAMUSCULAR | Status: AC
  Administered 2015-04-11: 30 mL
  Filled 2015-04-11: qty 30

## 2015-04-11 MED ORDER — TRIAMCINOLONE ACETONIDE 40 MG/ML IJ SUSP
INTRAMUSCULAR | Status: AC
  Administered 2015-04-11: 40 mg
  Filled 2015-04-11: qty 1

## 2015-04-11 NOTE — Patient Instructions (Addendum)
Continue present medications. Topamax and oxycodone. Please obtain antibiotic Ceftin and begin taking today  F/U PCP Dr. Lennox Grumbles for evaliation of  BP and general medical  condition.  F/U surgical evaluation  Neurosurgical follow-up evaluation as discussed  F/U neurological evaluation  May consider radiofrequency rhizolysis or intraspinal procedures pending response to present treatment and F/U evaluation.  Patient to call Pain Management Center should patient have concerns prior to scheduled return appointment. Pain Management Discharge Instructions  General Discharge Instructions :  If you need to reach your doctor call: Monday-Friday 8:00 am - 4:00 pm at [phone] or toll free [phone].  After clinic hours [phone] to have operator reach doctor.  Bring all of your medication bottles to all your appointments in the pain clinic.  To cancel or reschedule your appointment with Pain Management please remember to call 24 hours in advance to avoid a fee.  Refer to the educational materials which you have been given on: General Risks, I had my Procedure. Discharge Instructions, Post Sedation.  Post Procedure Instructions:  The drugs you were given will stay in your system until tomorrow, so for the next 24 hours you should not drive, make any legal decisions or drink any alcoholic beverages.  You may eat anything you prefer, but it is better to start with liquids then soups and crackers, and gradually work up to solid foods.  Please notify your doctor immediately if you have any unusual bleeding, trouble breathing or pain that is not related to your normal pain.  Depending on the type of procedure that was done, some parts of your body may feel week and/or numb.  This usually clears up by tonight or the next day.  Walk with the use of an assistive device or accompanied by an adult for the 24 hours.  You may use ice on the affected area for the first 24 hours.  Put ice in a  Ziploc bag and cover with a towel and place against area 15 minutes on 15 minutes off.  You may switch to heat after 24 hours.Epidural Steroid Injection Patient Information  Description: The epidural space surrounds the nerves as they exit the spinal cord.  In some patients, the nerves can be compressed and inflamed by a bulging disc or a tight spinal canal (spinal stenosis).  By injecting steroids into the epidural space, we can bring irritated nerves into direct contact with a potentially helpful medication.  These steroids act directly on the irritated nerves and can reduce swelling and inflammation which often leads to decreased pain.  Epidural steroids may be injected anywhere along the spine and from the neck to the low back depending upon the location of your pain.   After numbing the skin with local anesthetic (like Novocaine), a small needle is passed into the epidural space slowly.  You may experience a sensation of pressure while this is being done.  The entire block usually last less than 10 minutes.  Conditions which may be treated by epidural steroids:   Low back and leg pain  Neck and arm pain  Spinal stenosis  Post-laminectomy syndrome  Herpes zoster (shingles) pain  Pain from compression fractures  Preparation for the injection:  1. Do not eat any solid food or dairy products within 6 hours of your appointment.  2. You may drink clear liquids up to 2 hours before appointment.  Clear liquids include water, black coffee, juice or soda.  No milk or cream please. 3. You may take your regular  medication, including pain medications, with a sip of water before your appointment  Diabetics should hold regular insulin (if taken separately) and take 1/2 normal NPH dos the morning of the procedure.  Carry some sugar containing items with you to your appointment. 4. A driver must accompany you and be prepared to drive you home after your procedure.  5. Bring all your current  medications with your. 6. An IV may be inserted and sedation may be given at the discretion of the physician.   7. A blood pressure cuff, EKG and other monitors will often be applied during the procedure.  Some patients may need to have extra oxygen administered for a short period. 8. You will be asked to provide medical information, including your allergies, prior to the procedure.  We must know immediately if you are taking blood thinners (like Coumadin/Warfarin)  Or if you are allergic to IV iodine contrast (dye). We must know if you could possible be pregnant.  Possible side-effects:  Bleeding from needle site  Infection (rare, may require surgery)  Nerve injury (rare)  Numbness & tingling (temporary)  Difficulty urinating (rare, temporary)  Spinal headache ( a headache worse with upright posture)  Light -headedness (temporary)  Pain at injection site (several days)  Decreased blood pressure (temporary)  Weakness in arm/leg (temporary)  Pressure sensation in back/neck (temporary)  Call if you experience:  Fever/chills associated with headache or increased back/neck pain.  Headache worsened by an upright position.  New onset weakness or numbness of an extremity below the injection site  Hives or difficulty breathing (go to the emergency room)  Inflammation or drainage at the infection site  Severe back/neck pain  Any new symptoms which are concerning to you  Please note:  Although the local anesthetic injected can often make your back or neck feel good for several hours after the injection, the pain will likely return.  It takes 3-7 days for steroids to work in the epidural space.  You may not notice any pain relief for at least that one week.  If effective, we will often do a series of three injections spaced 3-6 weeks apart to maximally decrease your pain.  After the initial series, we generally will wait several months before considering a repeat injection of  the same type.  If you have any questions, please call [phone] [facility]

## 2015-04-11 NOTE — Progress Notes (Signed)
   Subjective:    Patient ID: Jill Rivas, female    DOB: [birth year], 44 y.o.   MRN: [id number]  HPI  CERVICAL EPIDURAL STEROID INJECTION  The patient is 44 year old female who returns to [place] for further evaluation and treatment of pain involving the cervical and upper extremity region with weakness of the left upper extremity.. Prior MRIs revealed patient to be with degenerative changes most notable at C5-6 with moderate disc bulging and narrowing of the ventral aspect of the thecal sac with minimal cord flattening moderate left-sided and mild right-sided foraminal narrowing noted. There is concern regarding intraspinal abnormalities contributing to patient's symptomatology Patient has undergone neurosurgical evaluation and has been recommended to continue treatment and Pain Management Center at this time. The risks benefits and expectations of the procedure were explained to patient. The patient was understanding and in agreement with suggested treatment plan.   DESCRIPTION OF PROCEDURE: CERVICAL EPIDURAL STEROID INJECTION  The patient was taken to fluoroscopy suite. Patient assumed the prone position. EKG, blood pressure, pulse, and pulse oximetry monitoring were all placed for continuous monitoring. Betadine prep of proposed entry site was accomplished. 1.5% lidocaine local anesthetic skin wheal was accomplished and 18-gauge Tuohy epidural needle was inserted at the C6-7 level under fluoroscopic guidance left of the midline via hanging drop technique with negative heme and negative CSF return. A total of 4 ml of preservative free normal saline with 40 mg of Kenalog was injected for epidural steroid injection.  The patient tolerated the procedure well  Myoneural block injection of the lumbar region Under fluoroscopic guidance 22-gauge needle was inserted at the L5 vertebral body level on the left side and following negative aspiration for heme and CSF a total of 4  mils of 0.25% bupivacaine with Norflex was injected for myoneural block injection of the lumbar paraspinal musculature region.  The patient tolerated procedure well.  Plan   Continue present medications. Topamax and oxycodone  F/U PCP Dr. Lennox Grumbles for evaliation of  BP and general medical  condition.  F/U surgical evaluation. The patient is to undergo neurosurgical reevaluation as planned  F/U neurological evaluation  May consider radiofrequency rhizolysis or intraspinal procedures pending response to present treatment and F/U evaluation.  Patient to call Pain Management Center should patient have concerns prior to scheduled return appointment.    Review of Systems     Objective:   Physical Exam        Assessment & Plan:

## 2015-04-12 ENCOUNTER — Telehealth: Payer: Self-pay | Admitting: *Deleted

## 2015-04-12 NOTE — Telephone Encounter (Signed)
No problems 

## 2015-04-19 ENCOUNTER — Other Ambulatory Visit: Payer: Self-pay | Admitting: Pain Medicine

## 2015-04-26 ENCOUNTER — Encounter: Payer: Self-pay | Admitting: Pain Medicine

## 2015-04-26 ENCOUNTER — Ambulatory Visit: Payer: Medicare Other | Attending: Pain Medicine | Admitting: Pain Medicine

## 2015-04-26 VITALS — BP 121/82 | HR 91 | Temp 97.7°F | Resp 16 | Ht 67.0 in | Wt 195.0 lb

## 2015-04-26 DIAGNOSIS — M5481 Occipital neuralgia: Secondary | ICD-10-CM | POA: Insufficient documentation

## 2015-04-26 DIAGNOSIS — G90522 Complex regional pain syndrome I of left lower limb: Secondary | ICD-10-CM | POA: Diagnosis not present

## 2015-04-26 DIAGNOSIS — M5030 Other cervical disc degeneration, unspecified cervical region: Secondary | ICD-10-CM | POA: Insufficient documentation

## 2015-04-26 DIAGNOSIS — M533 Sacrococcygeal disorders, not elsewhere classified: Secondary | ICD-10-CM | POA: Insufficient documentation

## 2015-04-26 DIAGNOSIS — M5137 Other intervertebral disc degeneration, lumbosacral region: Secondary | ICD-10-CM

## 2015-04-26 DIAGNOSIS — M542 Cervicalgia: Secondary | ICD-10-CM | POA: Diagnosis present

## 2015-04-26 DIAGNOSIS — M79605 Pain in left leg: Secondary | ICD-10-CM | POA: Diagnosis present

## 2015-04-26 DIAGNOSIS — M79604 Pain in right leg: Secondary | ICD-10-CM | POA: Diagnosis present

## 2015-04-26 DIAGNOSIS — M47812 Spondylosis without myelopathy or radiculopathy, cervical region: Secondary | ICD-10-CM | POA: Diagnosis not present

## 2015-04-26 DIAGNOSIS — M5412 Radiculopathy, cervical region: Secondary | ICD-10-CM | POA: Insufficient documentation

## 2015-04-26 MED ORDER — OXYCODONE HCL 10 MG PO TABS
ORAL_TABLET | ORAL | Status: DC
Start: 2015-04-26 — End: 2015-05-29

## 2015-04-26 NOTE — Progress Notes (Signed)
Dr Primus Bravo notified of UDS results on chart.

## 2015-04-26 NOTE — Progress Notes (Signed)
   Subjective:    Patient ID: Jill Rivas, female    DOB: 18-Apr-1971, 44 y.o.   MRN: [id number]  HPI  Patient is 44 year old female returns to [place] for further evaluation and treatment of pain involving the neck and upper extremity regions. Patient is status post cervical epidural steroid injection 2 with some persistent symptoms at the present time we will have patient undergo neurosurgical reevaluation for reassessment of her condition of pain of the neck and upper extremity pain paresthesias and weakness. Patient is to some return of her left lower extremity burning stinging throbbing pain felt to be due to complex regional pain syndrome. We will proceed with interventional treatment for complex regional pain syndrome of the left lower extremity at time return appointment as discussed. We will continue Topamax and oxycodone as prescribed. Patient was understanding and agreement status treatment plan.    Review of Systems     Objective:   Physical Exam  Past moderate tenderness of the splenius capitis and occipitalis musculature region. There was questionable positive Spurling's maneuver. Palpation over the cervical and thoracic facet paraspinal muscles region reproduced moderate discomfort. There was question decreased grip strength on the left compared to the right. There was unremarkable Tinel and Phalen's maneuver. No definite sensory deficit of the upper extremities of dermatomal distribution was detected. Palpation thoracic paraspinal muscle and thoracic facet region was with moderate tends to palpation and no evidence of muscle spasms. There was moderate tends to palpation over the lumbar paraspinal muscles lumbar facet region. Palpation over the PSIS and PII S region was with mild discomfort. Left lower extremity was with slight slight cyanotic you and was with evidence of allodynia. There was decreased EHL strength on the left compared to the right. There was  negative Homans. Abdomen nontender and no costovertebral angle tenderness noted.      Assessment & Plan:    Degenerative disc disease cervical spine Degenerative changes cervical spine most notable at the C5-6 level Cervical radiculopathy  Complex regional pain syndrome of left lower extremity  Sacroiliac joint dysfunction  Bilateral occipital neuralgia    Plan   Continue present medication Topamax and oxycodone  Lumbosacral selective nerve root block to be performed at time return appointment. There is concern regarding sympathetically mediated pain and sympathetic independent pain control patient's symptoms. Patient with significant relief of pain following lumbosacral selective nerve blocks compared to lumbar sympathetic blocks  F/U PCP Dr. Lennox Grumbles for evaliation of  BP and general medical  condition  F/U surgical evaluation  F/U neurological evaluation  May consider radiofrequency rhizolysis or intraspinal procedures pending response to present treatment and F/U evaluation   Patient to call Pain Management Center should patient have concerns prior to scheduled return appointmen.

## 2015-04-26 NOTE — Progress Notes (Signed)
Safety precautions to be maintained throughout the outpatient stay will include: orient to surroundings, keep bed in low position, maintain call bell within reach at all times, provide assistance with transfer out of bed and ambulation.  

## 2015-04-26 NOTE — Patient Instructions (Addendum)
Continue present medication Topamax and oxycodone  Lumbosacral selective nerve root block on 05/07/2015  F/U PCP Dr. Lennox Grumbles for evaliation of  BP and general medical  condition  F/U surgical evaluation. Please ask Jill Rivas the date of your neurosurgical evaluation of pain of the neck and upper extremities with weakness of the upper extremities  F/U neurological evaluation  May consider radiofrequency rhizolysis or intraspinal procedures pending response to present treatment and F/U evaluation   Patient to call [facility] should patient have concerns prior to scheduled return appointmen. GENERAL RISKS AND COMPLICATIONS  What are the risk, side effects and possible complications? Generally speaking, most procedures are safe.  However, with any procedure there are risks, side effects, and the possibility of complications.  The risks and complications are dependent upon the sites that are lesioned, or the type of nerve block to be performed.  The closer the procedure is to the spine, the more serious the risks are.  Great care is taken when placing the radio frequency needles, block needles or lesioning probes, but sometimes complications can occur. 1. Infection: Any time there is an injection through the skin, there is a risk of infection.  This is why sterile conditions are used for these blocks.  There are four possible types of infection. 1. Localized skin infection. 2. Central Nervous System Infection-This can be in the form of Meningitis, which can be deadly. 3. Epidural Infections-This can be in the form of an epidural abscess, which can cause pressure inside of the spine, causing compression of the spinal cord with subsequent paralysis. This would require an emergency surgery to decompress, and there are no guarantees that the patient would recover from the paralysis. 4. Discitis-This is an infection of the intervertebral discs.  It occurs in about 1% of discography procedures.  It  is difficult to treat and it may lead to surgery.        2. Pain: the needles have to go through skin and soft tissues, will cause soreness.       3. Damage to internal structures:  The nerves to be lesioned may be near blood vessels or    other nerves which can be potentially damaged.       4. Bleeding: Bleeding is more common if the patient is taking blood thinners such as  aspirin, Coumadin, Ticiid, Plavix, etc., or if he/she have some genetic predisposition  such as hemophilia. Bleeding into the spinal canal can cause compression of the spinal  cord with subsequent paralysis.  This would require an emergency surgery to  decompress and there are no guarantees that the patient would recover from the  paralysis.       5. Pneumothorax:  Puncturing of a lung is a possibility, every time a needle is introduced in  the area of the chest or upper back.  Pneumothorax refers to free air around the  collapsed lung(s), inside of the thoracic cavity (chest cavity).  Another two possible  complications related to a similar event would include: Hemothorax and Chylothorax.   These are variations of the Pneumothorax, where instead of air around the collapsed  lung(s), you may have blood or chyle, respectively.       6. Spinal headaches: They may occur with any procedures in the area of the spine.       7. Persistent CSF (Cerebro-Spinal Fluid) leakage: This is a rare problem, but may occur  with prolonged intrathecal or epidural catheters either due to the formation of  a fistulous  track or a dural tear.       8. Nerve damage: By working so close to the spinal cord, there is always a possibility of  nerve damage, which could be as serious as a permanent spinal cord injury with  paralysis.       9. Death:  Although rare, severe deadly allergic reactions known as "Anaphylactic  reaction" can occur to any of the medications used.      10. Worsening of the symptoms:  We can always make thing worse.  What are the chances  of something like this happening? Chances of any of this occuring are extremely low.  By statistics, you have more of a chance of getting killed in a motor vehicle accident: while driving to the hospital than any of the above occurring .  Nevertheless, you should be aware that they are possibilities.  In general, it is similar to taking a shower.  Everybody knows that you can slip, hit your head and get killed.  Does that mean that you should not shower again?  Nevertheless always keep in mind that statistics do not mean anything if you happen to be on the wrong side of them.  Even if a procedure has a 1 (one) in a 1,000,000 (million) chance of going wrong, it you happen to be that one..Also, keep in mind that by statistics, you have more of a chance of having something go wrong when taking medications.  Who should not have this procedure? If you are on a blood thinning medication (e.g. Coumadin, Plavix, see list of "Blood Thinners"), or if you have an active infection going on, you should not have the procedure.  If you are taking any blood thinners, please inform your physician.  How should I prepare for this procedure?  Do not eat or drink anything at least six hours prior to the procedure.  Bring a driver with you .  It cannot be a taxi.  Come accompanied by an adult that can drive you back, and that is strong enough to help you if your legs get weak or numb from the local anesthetic.  Take all of your medicines the morning of the procedure with just enough water to swallow them.  If you have diabetes, make sure that you are scheduled to have your procedure done first thing in the morning, whenever possible.  If you have diabetes, take only half of your insulin dose and notify our nurse that you have done so as soon as you arrive at the clinic.  If you are diabetic, but only take blood sugar pills (oral hypoglycemic), then do not take them on the morning of your procedure.  You may take them  after you have had the procedure.  Do not take aspirin or any aspirin-containing medications, at least eleven (11) days prior to the procedure.  They may prolong bleeding.  Wear loose fitting clothing that may be easy to take off and that you would not mind if it got stained with Betadine or blood.  Do not wear any jewelry or perfume  Remove any nail coloring.  It will interfere with some of our monitoring equipment.  NOTE: Remember that this is not meant to be interpreted as a complete list of all possible complications.  Unforeseen problems may occur.  BLOOD THINNERS The following drugs contain aspirin or other products, which can cause increased bleeding during surgery and should not be taken for 2 weeks prior to and 1  week after surgery.  If you should need take something for relief of minor pain, you may take acetaminophen which is found in Tylenol,m Datril, Anacin-3 and Panadol. It is not blood thinner. The products listed below are.  Do not take any of the products listed below in addition to any listed on your instruction sheet.  A.P.C or A.P.C with Codeine Codeine Phosphate Capsules #3 Ibuprofen Ridaura  ABC compound Congesprin Imuran rimadil  Advil Cope Indocin Robaxisal  Alka-Seltzer Effervescent Pain Reliever and Antacid Coricidin or Coricidin-D  Indomethacin Rufen  Alka-Seltzer plus Cold Medicine Cosprin Ketoprofen S-A-C Tablets  Anacin Analgesic Tablets or Capsules Coumadin Korlgesic Salflex  Anacin Extra Strength Analgesic tablets or capsules CP-2 Tablets Lanoril Salicylate  Anaprox Cuprimine Capsules Levenox Salocol  Anexsia-D Dalteparin Magan Salsalate  Anodynos Darvon compound Magnesium Salicylate Sine-off  Ansaid Dasin Capsules Magsal Sodium Salicylate  Anturane Depen Capsules Marnal Soma  APF Arthritis pain formula Dewitt's Pills Measurin Stanback  Argesic Dia-Gesic Meclofenamic Sulfinpyrazone  Arthritis Bayer Timed Release Aspirin Diclofenac Meclomen Sulindac   Arthritis pain formula Anacin Dicumarol Medipren Supac  Analgesic (Safety coated) Arthralgen Diffunasal Mefanamic Suprofen  Arthritis Strength Bufferin Dihydrocodeine Mepro Compound Suprol  Arthropan liquid Dopirydamole Methcarbomol with Aspirin Synalgos  ASA tablets/Enseals Disalcid Micrainin Tagament  Ascriptin Doan's Midol Talwin  Ascriptin A/D Dolene Mobidin Tanderil  Ascriptin Extra Strength Dolobid Moblgesic Ticlid  Ascriptin with Codeine Doloprin or Doloprin with Codeine Momentum Tolectin  Asperbuf Duoprin Mono-gesic Trendar  Aspergum Duradyne Motrin or Motrin IB Triminicin  Aspirin plain, buffered or enteric coated Durasal Myochrisine Trigesic  Aspirin Suppositories Easprin Nalfon Trillsate  Aspirin with Codeine Ecotrin Regular or Extra Strength Naprosyn Uracel  Atromid-S Efficin Naproxen Ursinus  Auranofin Capsules Elmiron Neocylate Vanquish  Axotal Emagrin Norgesic Verin  Azathioprine Empirin or Empirin with Codeine Normiflo Vitamin E  Azolid Emprazil Nuprin Voltaren  Bayer Aspirin plain, buffered or children's or timed BC Tablets or powders Encaprin Orgaran Warfarin Sodium  Buff-a-Comp Enoxaparin Orudis Zorpin  Buff-a-Comp with Codeine Equegesic Os-Cal-Gesic   Buffaprin Excedrin plain, buffered or Extra Strength Oxalid   Bufferin Arthritis Strength Feldene Oxphenbutazone   Bufferin plain or Extra Strength Feldene Capsules Oxycodone with Aspirin   Bufferin with Codeine Fenoprofen Fenoprofen Pabalate or Pabalate-SF   Buffets II Flogesic Panagesic   Buffinol plain or Extra Strength Florinal or Florinal with Codeine Panwarfarin   Buf-Tabs Flurbiprofen Penicillamine   Butalbital Compound Four-way cold tablets Penicillin   Butazolidin Fragmin Pepto-Bismol   Carbenicillin Geminisyn Percodan   Carna Arthritis Reliever Geopen Persantine   Carprofen Gold's salt Persistin   Chloramphenicol Goody's Phenylbutazone   Chloromycetin Haltrain Piroxlcam   Clmetidine heparin Plaquenil    Cllnoril Hyco-pap Ponstel   Clofibrate Hydroxy chloroquine Propoxyphen         Before stopping any of these medications, be sure to consult the physician who ordered them.  Some, such as Coumadin (Warfarin) are ordered to prevent or treat serious conditions such as "deep thrombosis", "pumonary embolisms", and other heart problems.  The amount of time that you may need off of the medication may also vary with the medication and the reason for which you were taking it.  If you are taking any of these medications, please make sure you notify your pain physician before you undergo any procedures.         Selective Nerve Root Block Patient Information  Description: Specific nerve roots exit the spinal canal and these nerves can be compressed and inflamed by a bulging disc and bone spurs.  By injecting steroids on the nerve root, we can potentially decrease the inflammation surrounding these nerves, which often leads to decreased pain.  Also, by injecting local anesthesia on the nerve root, this can provide [place] helpful information to give to your referring doctor if it decreases your pain.  Selective nerve root blocks can be done along the spine from the neck to the low back depending on the location of your pain.   After numbing the skin with local anesthesia, a small needle is passed to the nerve root and the position of the needle is verified using x-ray pictures.  After the needle is in correct position, we then deposit the medication.  You may experience a pressure sensation while this is being done.  The entire block usually lasts less than 15 minutes.  Conditions that may be treated with selective nerve root blocks:  Low back and leg pain  Spinal stenosis  Diagnostic block prior to potential surgery  Neck and arm pain  Post laminectomy syndrome  Preparation for the injection:  1. Do not eat any solid food or dairy products within 6 hours of your appointment. 2. You may drink clear  liquids up to 2 hours before an appointment.  Clear liquids include water, black coffee, juice or soda.  No milk or cream please. 3. You may take your regular medications, including pain medications, with a sip of water before your appointment.  Diabetics should hold regular insulin (if taken separately) and take 1/2 normal NPH dose the morning of the procedure.  Carry some sugar containing items with you to your appointment. 4. A driver must accompany you and be prepared to drive you home after your procedure. 5. Bring all your current medications with you. 6. An IV may be inserted and sedation may be given at the discretion of the physician. 7. A blood pressure cuff, EKG, and other monitors will often be applied during the procedure.  Some patients may need to have extra oxygen administered for a short period. 8. You will be asked to provide medical information, including allergies, prior to the procedure.  We must know immediately if you are taking blood  Thinners (like Coumadin) or if you are allergic to IV iodine contrast (dye).  Possible side-effects: All are usually temporary  Bleeding from needle site  Light headedness  Numbness and tingling  Decreased blood pressure  Weakness in arms/legs  Pressure sensation in back/neck  Pain at injection site (several days)  Possible complications: All are extremely rare  Infection  Nerve injury  Spinal headache (a headache wore with upright position)  Call if you experience:  Fever/chills associated with headache or increased back/neck pain  Headache worsened by an upright position  New onset weakness or numbness of an extremity below the injection site  Hives or difficulty breathing (go to the emergency room)  Inflammation or drainage at the injection site(s)  Severe back/neck pain greater than usual  New symptoms which are concerning to you  Please note:  Although the local anesthetic injected can often make your  back or neck feel good for several hours after the injection the pain will likely return.  It takes 3-5 days for steroids to work on the nerve root. You may not notice any pain relief for at least one week.  If effective, we will often do a series of 3 injections spaced 3-6 weeks apart to maximally decrease your pain.    If you have any questions, please call [phone] [place]  [facility]

## 2015-05-07 ENCOUNTER — Ambulatory Visit: Payer: Medicare Other | Attending: Pain Medicine | Admitting: Pain Medicine

## 2015-05-07 ENCOUNTER — Encounter: Payer: Self-pay | Admitting: Pain Medicine

## 2015-05-07 VITALS — BP 115/62 | HR 73 | Temp 98.3°F | Resp 16 | Ht 67.0 in | Wt 195.0 lb

## 2015-05-07 DIAGNOSIS — M533 Sacrococcygeal disorders, not elsewhere classified: Secondary | ICD-10-CM

## 2015-05-07 DIAGNOSIS — M79604 Pain in right leg: Secondary | ICD-10-CM | POA: Insufficient documentation

## 2015-05-07 DIAGNOSIS — M79605 Pain in left leg: Secondary | ICD-10-CM | POA: Insufficient documentation

## 2015-05-07 DIAGNOSIS — G90522 Complex regional pain syndrome I of left lower limb: Secondary | ICD-10-CM

## 2015-05-07 DIAGNOSIS — M5137 Other intervertebral disc degeneration, lumbosacral region: Secondary | ICD-10-CM

## 2015-05-07 DIAGNOSIS — M5030 Other cervical disc degeneration, unspecified cervical region: Secondary | ICD-10-CM

## 2015-05-07 DIAGNOSIS — M5010 Cervical disc disorder with radiculopathy, unspecified cervical region: Secondary | ICD-10-CM

## 2015-05-07 DIAGNOSIS — M545 Low back pain: Secondary | ICD-10-CM | POA: Diagnosis present

## 2015-05-07 MED ORDER — TRIAMCINOLONE ACETONIDE 40 MG/ML IJ SUSP
INTRAMUSCULAR | Status: AC
  Administered 2015-05-07: 10 mg
  Filled 2015-05-07: qty 1

## 2015-05-07 MED ORDER — MIDAZOLAM HCL 5 MG/5ML IJ SOLN
INTRAMUSCULAR | Status: AC
  Administered 2015-05-07: 5 mg via INTRAVENOUS
  Filled 2015-05-07: qty 5

## 2015-05-07 MED ORDER — ORPHENADRINE CITRATE 30 MG/ML IJ SOLN
INTRAMUSCULAR | Status: AC
  Filled 2015-05-07: qty 2

## 2015-05-07 MED ORDER — FENTANYL CITRATE (PF) 100 MCG/2ML IJ SOLN
INTRAMUSCULAR | Status: AC
  Administered 2015-05-07: 100 ug via INTRAVENOUS
  Filled 2015-05-07: qty 2

## 2015-05-07 MED ORDER — BUPIVACAINE HCL (PF) 0.25 % IJ SOLN
INTRAMUSCULAR | Status: AC
  Administered 2015-05-07: 30 mL
  Filled 2015-05-07: qty 30

## 2015-05-07 NOTE — Progress Notes (Signed)
Safety precautions to be maintained throughout the outpatient stay will include: orient to surroundings, keep bed in low position, maintain call bell within reach at all times, provide assistance with transfer out of bed and ambulation.  

## 2015-05-07 NOTE — Patient Instructions (Addendum)
Continue present medications Topamax and oxycodone  F/U PCP Dr. Lennox Grumbles for evaliation of  BP and general medical  condition  F/U surgical evaluation. Patient undergo neurosurgical evaluation as discussed  F/U neurological evaluation  May consider radiofrequency rhizolysis or intraspinal procedures pending response to present treatment and F/U evaluation   Patient to call [facility] Center should patient have concerns prior to scheduled return appointmen. Pain Management Discharge Instructions  General Discharge Instructions :  If you need to reach your doctor call: Monday-Friday 8:00 am - 4:00 pm at [phone] or toll free [phone].  After clinic hours [phone] to have operator reach doctor.  Bring all of your medication bottles to all your appointments in the pain clinic.  To cancel or reschedule your appointment with Pain Management please remember to call 24 hours in advance to avoid a fee.  Refer to the educational materials which you have been given on: General Risks, I had my Procedure. Discharge Instructions, Post Sedation.  Post Procedure Instructions:  The drugs you were given will stay in your system until tomorrow, so for the next 24 hours you should not drive, make any legal decisions or drink any alcoholic beverages.  You may eat anything you prefer, but it is better to start with liquids then soups and crackers, and gradually work up to solid foods.  Please notify your doctor immediately if you have any unusual bleeding, trouble breathing or pain that is not related to your normal pain.  Depending on the type of procedure that was done, some parts of your body may feel week and/or numb.  This usually clears up by tonight or the next day.  Walk with the use of an assistive device or accompanied by an adult for the 24 hours.  You may use ice on the affected area for the first 24 hours.  Put ice in a Ziploc bag and cover with a towel and place against area  15 minutes on 15 minutes off.  You may switch to heat after 24 hours.GENERAL RISKS AND COMPLICATIONS  What are the risk, side effects and possible complications? Generally speaking, most procedures are safe.  However, with any procedure there are risks, side effects, and the possibility of complications.  The risks and complications are dependent upon the sites that are lesioned, or the type of nerve block to be performed.  The closer the procedure is to the spine, the more serious the risks are.  Great care is taken when placing the radio frequency needles, block needles or lesioning probes, but sometimes complications can occur. 1. Infection: Any time there is an injection through the skin, there is a risk of infection.  This is why sterile conditions are used for these blocks.  There are four possible types of infection. 1. Localized skin infection. 2. Central Nervous System Infection-This can be in the form of Meningitis, which can be deadly. 3. Epidural Infections-This can be in the form of an epidural abscess, which can cause pressure inside of the spine, causing compression of the spinal cord with subsequent paralysis. This would require an emergency surgery to decompress, and there are no guarantees that the patient would recover from the paralysis. 4. Discitis-This is an infection of the intervertebral discs.  It occurs in about 1% of discography procedures.  It is difficult to treat and it may lead to surgery.        2. Pain: the needles have to go through skin and soft tissues, will cause soreness.  3. Damage to internal structures:  The nerves to be lesioned may be near blood vessels or    other nerves which can be potentially damaged.       4. Bleeding: Bleeding is more common if the patient is taking blood thinners such as  aspirin, Coumadin, Ticiid, Plavix, etc., or if he/she have some genetic predisposition  such as hemophilia. Bleeding into the spinal canal can cause compression of  the spinal  cord with subsequent paralysis.  This would require an emergency surgery to  decompress and there are no guarantees that the patient would recover from the  paralysis.       5. Pneumothorax:  Puncturing of a lung is a possibility, every time a needle is introduced in  the area of the chest or upper back.  Pneumothorax refers to free air around the  collapsed lung(s), inside of the thoracic cavity (chest cavity).  Another two possible  complications related to a similar event would include: Hemothorax and Chylothorax.   These are variations of the Pneumothorax, where instead of air around the collapsed  lung(s), you may have blood or chyle, respectively.       6. Spinal headaches: They may occur with any procedures in the area of the spine.       7. Persistent CSF (Cerebro-Spinal Fluid) leakage: This is a rare problem, but may occur  with prolonged intrathecal or epidural catheters either due to the formation of a fistulous  track or a dural tear.       8. Nerve damage: By working so close to the spinal cord, there is always a possibility of  nerve damage, which could be as serious as a permanent spinal cord injury with  paralysis.       9. Death:  Although rare, severe deadly allergic reactions known as "Anaphylactic  reaction" can occur to any of the medications used.      10. Worsening of the symptoms:  We can always make thing worse.  What are the chances of something like this happening? Chances of any of this occuring are extremely low.  By statistics, you have more of a chance of getting killed in a motor vehicle accident: while driving to the hospital than any of the above occurring .  Nevertheless, you should be aware that they are possibilities.  In general, it is similar to taking a shower.  Everybody knows that you can slip, hit your head and get killed.  Does that mean that you should not shower again?  Nevertheless always keep in mind that statistics do not mean anything if you  happen to be on the wrong side of them.  Even if a procedure has a 1 (one) in a 1,000,000 (million) chance of going wrong, it you happen to be that one..Also, keep in mind that by statistics, you have more of a chance of having something go wrong when taking medications.  Who should not have this procedure? If you are on a blood thinning medication (e.g. Coumadin, Plavix, see list of "Blood Thinners"), or if you have an active infection going on, you should not have the procedure.  If you are taking any blood thinners, please inform your physician.  How should I prepare for this procedure?  Do not eat or drink anything at least six hours prior to the procedure.  Bring a driver with you .  It cannot be a taxi.  Come accompanied by an adult that can drive you back, and  that is strong enough to help you if your legs get weak or numb from the local anesthetic.  Take all of your medicines the morning of the procedure with just enough water to swallow them.  If you have diabetes, make sure that you are scheduled to have your procedure done first thing in the morning, whenever possible.  If you have diabetes, take only half of your insulin dose and notify our nurse that you have done so as soon as you arrive at the clinic.  If you are diabetic, but only take blood sugar pills (oral hypoglycemic), then do not take them on the morning of your procedure.  You may take them after you have had the procedure.  Do not take aspirin or any aspirin-containing medications, at least eleven (11) days prior to the procedure.  They may prolong bleeding.  Wear loose fitting clothing that may be easy to take off and that you would not mind if it got stained with Betadine or blood.  Do not wear any jewelry or perfume  Remove any nail coloring.  It will interfere with some of our monitoring equipment.  NOTE: Remember that this is not meant to be interpreted as a complete list of all possible complications.   Unforeseen problems may occur.  BLOOD THINNERS The following drugs contain aspirin or other products, which can cause increased bleeding during surgery and should not be taken for 2 weeks prior to and 1 week after surgery.  If you should need take something for relief of minor pain, you may take acetaminophen which is found in Tylenol,m Datril, Anacin-3 and Panadol. It is not blood thinner. The products listed below are.  Do not take any of the products listed below in addition to any listed on your instruction sheet.  A.P.C or A.P.C with Codeine Codeine Phosphate Capsules #3 Ibuprofen Ridaura  ABC compound Congesprin Imuran rimadil  Advil Cope Indocin Robaxisal  Alka-Seltzer Effervescent Pain Reliever and Antacid Coricidin or Coricidin-D  Indomethacin Rufen  Alka-Seltzer plus Cold Medicine Cosprin Ketoprofen S-A-C Tablets  Anacin Analgesic Tablets or Capsules Coumadin Korlgesic Salflex  Anacin Extra Strength Analgesic tablets or capsules CP-2 Tablets Lanoril Salicylate  Anaprox Cuprimine Capsules Levenox Salocol  Anexsia-D Dalteparin Magan Salsalate  Anodynos Darvon compound Magnesium Salicylate Sine-off  Ansaid Dasin Capsules Magsal Sodium Salicylate  Anturane Depen Capsules Marnal Soma  APF Arthritis pain formula Dewitt's Pills Measurin Stanback  Argesic Dia-Gesic Meclofenamic Sulfinpyrazone  Arthritis Bayer Timed Release Aspirin Diclofenac Meclomen Sulindac  Arthritis pain formula Anacin Dicumarol Medipren Supac  Analgesic (Safety coated) Arthralgen Diffunasal Mefanamic Suprofen  Arthritis Strength Bufferin Dihydrocodeine Mepro Compound Suprol  Arthropan liquid Dopirydamole Methcarbomol with Aspirin Synalgos  ASA tablets/Enseals Disalcid Micrainin Tagament  Ascriptin Doan's Midol Talwin  Ascriptin A/D Dolene Mobidin Tanderil  Ascriptin Extra Strength Dolobid Moblgesic Ticlid  Ascriptin with Codeine Doloprin or Doloprin with Codeine Momentum Tolectin  Asperbuf Duoprin Mono-gesic  Trendar  Aspergum Duradyne Motrin or Motrin IB Triminicin  Aspirin plain, buffered or enteric coated Durasal Myochrisine Trigesic  Aspirin Suppositories Easprin Nalfon Trillsate  Aspirin with Codeine Ecotrin Regular or Extra Strength Naprosyn Uracel  Atromid-S Efficin Naproxen Ursinus  Auranofin Capsules Elmiron Neocylate Vanquish  Axotal Emagrin Norgesic Verin  Azathioprine Empirin or Empirin with Codeine Normiflo Vitamin E  Azolid Emprazil Nuprin Voltaren  Bayer Aspirin plain, buffered or children's or timed BC Tablets or powders Encaprin Orgaran Warfarin Sodium  Buff-a-Comp Enoxaparin Orudis Zorpin  Buff-a-Comp with Codeine Equegesic Os-Cal-Gesic   Buffaprin Excedrin plain, buffered  or Extra Strength Oxalid   Bufferin Arthritis Strength Feldene Oxphenbutazone   Bufferin plain or Extra Strength Feldene Capsules Oxycodone with Aspirin   Bufferin with Codeine Fenoprofen Fenoprofen Pabalate or Pabalate-SF   Buffets II Flogesic Panagesic   Buffinol plain or Extra Strength Florinal or Florinal with Codeine Panwarfarin   Buf-Tabs Flurbiprofen Penicillamine   Butalbital Compound Four-way cold tablets Penicillin   Butazolidin Fragmin Pepto-Bismol   Carbenicillin Geminisyn Percodan   Carna Arthritis Reliever Geopen Persantine   Carprofen Gold's salt Persistin   Chloramphenicol Goody's Phenylbutazone   Chloromycetin Haltrain Piroxlcam   Clmetidine heparin Plaquenil   Cllnoril Hyco-pap Ponstel   Clofibrate Hydroxy chloroquine Propoxyphen         Before stopping any of these medications, be sure to consult the physician who ordered them.  Some, such as Coumadin (Warfarin) are ordered to prevent or treat serious conditions such as "deep thrombosis", "pumonary embolisms", and other heart problems.  The amount of time that you may need off of the medication may also vary with the medication and the reason for which you were taking it.  If you are taking any of these medications, please make sure  you notify your pain physician before you undergo any procedures.

## 2015-05-07 NOTE — Progress Notes (Signed)
Subjective:    Patient ID: Jill Rivas, female    DOB: December 25, 1970, 44 y.o.   MRN: [id number]  HPI  PROCEDURE PERFORMED: Lumbosacral selective nerve root block   NOTE: The patient is a 44 y.o. female who returns to [place] for further evaluation and treatment of pain involving the lumbar and lower extremity region. 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  The patient is with history of prior trauma and surgery of the lower extremity with severe sensitivity to touch with areas of allodynia as well as discoloration of the distal third of the lower extremity.There is concern regarding  Component of complex regional pain syndrome felt to be present contributing to patient's symptomatology period there is concern regarding both sympathetically mediated pain and sympathetic independent pain. The patient has been with most relief of pain following somatic block. Proceed with lumbosacral selective nerve root block at this time an attempt to decrease severity of symptoms, minimize progression of symptoms, and avoid need for more involved treatment. contributing to the patient's symptomatology. The risks, benefits, and expectations of the procedure have been explained to the patient who was understanding and in agreement with suggested treatment plan. We will proceed with interventional treatment as discussed and as explained to the patient. The patient is understanding and in agreement with suggested treatment plan.   DESCRIPTION OF PROCEDURE: Lumbosacral selective nerve root block with IV Versed, IV fentanyl conscious sedation, EKG, blood pressure, pulse, and pulse oximetry monitoring. The procedure was performed with the patient in the prone position under fluoroscopic guidance. With the patient in the prone position, Betadine prep of proposed entry site was performed.  Local anesthetic skin wheal of proposed needle entry site was prepared with 1.5% plain lidocaine with AP view of the lumbosacral spine.   PROCEDURE #1: Needle placement at the  left L 2 vertebral body: A 22 -gauge needle was inserted at the inferior border of the transverse process of the vertebral body with needle placed medial to the midline of the transverse process on AP view of the lumbosacral spine.   NEEDLE PLACEMENT AT   L3 , L4, and L5  VERTEBRAL BODY LEVELS  Needle  placement was accomplished at  L3, L4, and L5  vertebral body levels on the  left side exactly as was accomplished at the  L2  vertebral body level  and utilizing the same technique and under fluoroscopic guidance.  PROCEDURE #4: Needle placement at the S1 foramen. With the patient in the prone position with Betadine prep of proposed entry site accomplished, the S1 foramen was visualized under fluoroscopic guidance with AP view of the lumbosacral spine with cephalad orientation of the fluoroscope with local anesthetic skin wheal of 1.5% lidocaine of proposed needle entry site prepared. A 22-gauge needle was inserted S1 foramen under fluoroscopic guidance eliciting paresthesias radiating from the buttocks to the lower extremity after which needle was slightly withdrawn.   Needle placement was then verified on lateral view at all levels with needle tip documented to be in the posterior superior quadrant of the intervertebral foramen of  L  2, L3, L4 , and L5. Following negative aspiration for heme and CSF at each level, each level was injected with 3 mL of 0.25% bupivacaine with Kenalog    The patient tolerated the procedure well. A total of 10 mg of Kenalog was utilized for the  procedure.   PLAN:  1. Medications: Will continue presently prescribed medications. Topamax oxycodoneThe patient is to undergo follow-up evaluation with PCP Dr. Lennox Grumbles for evaluation of blood pressure and general medical condition status post procedure  performed on today's visit. 2. Surgical follow-up evaluation  to be considered 3. Neurological evaluation . We will consider further neurological evaluation pending follow-up evaluation of patient and response to present treatment. 4. May consider radiofrequency procedures, implantation type procedures and other treatment pending response to treatment and follow-up evaluation. 5. The patient has been advise do adhere to proper body mechanics and avoid activities which may aggravate condition. 6. The patient has been advised to call the Pain Management Center prior to scheduled return appointment should there be significant change in the patient's condition or should the patient have other concerns regarding condition prior to scheduled return appointment.   Review of Systems     Objective:   Physical Exam        Assessment & Plan:

## 2015-05-08 ENCOUNTER — Telehealth: Payer: Self-pay | Admitting: *Deleted

## 2015-05-08 NOTE — Telephone Encounter (Signed)
Denies complications post procedure. 

## 2015-05-29 ENCOUNTER — Ambulatory Visit: Payer: Medicare Other | Attending: Pain Medicine | Admitting: Pain Medicine

## 2015-05-29 ENCOUNTER — Encounter: Payer: Self-pay | Admitting: Pain Medicine

## 2015-05-29 VITALS — BP 120/88 | HR 93 | Temp 97.3°F | Resp 16 | Ht 67.0 in | Wt 195.0 lb

## 2015-05-29 DIAGNOSIS — M533 Sacrococcygeal disorders, not elsewhere classified: Secondary | ICD-10-CM | POA: Diagnosis not present

## 2015-05-29 DIAGNOSIS — M51379 Other intervertebral disc degeneration, lumbosacral region without mention of lumbar back pain or lower extremity pain: Secondary | ICD-10-CM

## 2015-05-29 DIAGNOSIS — M47812 Spondylosis without myelopathy or radiculopathy, cervical region: Secondary | ICD-10-CM | POA: Insufficient documentation

## 2015-05-29 DIAGNOSIS — G90522 Complex regional pain syndrome I of left lower limb: Secondary | ICD-10-CM

## 2015-05-29 DIAGNOSIS — M79601 Pain in right arm: Secondary | ICD-10-CM | POA: Diagnosis present

## 2015-05-29 DIAGNOSIS — M542 Cervicalgia: Secondary | ICD-10-CM | POA: Diagnosis present

## 2015-05-29 DIAGNOSIS — M5030 Other cervical disc degeneration, unspecified cervical region: Secondary | ICD-10-CM | POA: Diagnosis not present

## 2015-05-29 DIAGNOSIS — M5010 Cervical disc disorder with radiculopathy, unspecified cervical region: Secondary | ICD-10-CM

## 2015-05-29 DIAGNOSIS — M5412 Radiculopathy, cervical region: Secondary | ICD-10-CM | POA: Insufficient documentation

## 2015-05-29 DIAGNOSIS — M5137 Other intervertebral disc degeneration, lumbosacral region: Secondary | ICD-10-CM

## 2015-05-29 DIAGNOSIS — M5481 Occipital neuralgia: Secondary | ICD-10-CM | POA: Insufficient documentation

## 2015-05-29 DIAGNOSIS — M79602 Pain in left arm: Secondary | ICD-10-CM | POA: Diagnosis present

## 2015-05-29 MED ORDER — TOPIRAMATE 50 MG PO TABS: ORAL_TABLET | ORAL | Status: DC

## 2015-05-29 MED ORDER — OXYCODONE HCL 10 MG PO TABS: ORAL_TABLET | ORAL | Status: DC

## 2015-05-29 NOTE — Patient Instructions (Addendum)
PLAN   Continue present medication Topamax and oxycodone   Lumbosacral selective nerve root block performed at time of return appointment  F/U PCP Dr. Lennox Grumbles or evaliation of  BP and general medical  condition  F/U surgical evaluation as discussed  F/U neurological evaluation. May consider pending follow-up evaluations  May consider radiofrequency rhizolysis or intraspinal procedures pending response to present treatment and F/U evaluation   Patient to call Pain Management Center should patient have concerns prior to scheduled return appointment. Pain Management Discharge Instructions  General Discharge Instructions :  If you need to reach your doctor call: Monday-Friday 8:00 am - 4:00 pm at [phone] or toll free [phone].  After clinic hours [phone] to have operator reach doctor.  Bring all of your medication bottles to all your appointments in the pain clinic.  To cancel or reschedule your appointment with Pain Management please remember to call 24 hours in advance to avoid a fee.  Refer to the educational materials which you have been given on: General Risks, I had my Procedure. Discharge Instructions, Post Sedation.  Post Procedure Instructions:  The drugs you were given will stay in your system until tomorrow, so for the next 24 hours you should not drive, make any legal decisions or drink any alcoholic beverages.  You may eat anything you prefer, but it is better to start with liquids then soups and crackers, and gradually work up to solid foods.  Please notify your doctor immediately if you have any unusual bleeding, trouble breathing or pain that is not related to your normal pain.  Depending on the type of procedure that was done, some parts of your body may feel week and/or numb.  This usually clears up by tonight or the next day.  Walk with the use of an assistive device or accompanied by an adult for the 24 hours.  You may use ice on the affected area  for the first 24 hours.  Put ice in a Ziploc bag and cover with a towel and place against area 15 minutes on 15 minutes off.  You may switch to heat after 24 hours.GENERAL RISKS AND COMPLICATIONS  What are the risk, side effects and possible complications? Generally speaking, most procedures are safe.  However, with any procedure there are risks, side effects, and the possibility of complications.  The risks and complications are dependent upon the sites that are lesioned, or the type of nerve block to be performed.  The closer the procedure is to the spine, the more serious the risks are.  Great care is taken when placing the radio frequency needles, block needles or lesioning probes, but sometimes complications can occur. 1. Infection: Any time there is an injection through the skin, there is a risk of infection.  This is why sterile conditions are used for these blocks.  There are four possible types of infection. 1. Localized skin infection. 2. Central Nervous System Infection-This can be in the form of Meningitis, which can be deadly. 3. Epidural Infections-This can be in the form of an epidural abscess, which can cause pressure inside of the spine, causing compression of the spinal cord with subsequent paralysis. This would require an emergency surgery to decompress, and there are no guarantees that the patient would recover from the paralysis. 4. Discitis-This is an infection of the intervertebral discs.  It occurs in about 1% of discography procedures.  It is difficult to treat and it may lead to surgery.  2. Pain: the needles have to go through skin and soft tissues, will cause soreness.       3. Damage to internal structures:  The nerves to be lesioned may be near blood vessels or    other nerves which can be potentially damaged.       4. Bleeding: Bleeding is more common if the patient is taking blood thinners such as  aspirin, Coumadin, Ticiid, Plavix, etc., or if he/she have some  genetic predisposition  such as hemophilia. Bleeding into the spinal canal can cause compression of the spinal  cord with subsequent paralysis.  This would require an emergency surgery to  decompress and there are no guarantees that the patient would recover from the  paralysis.       5. Pneumothorax:  Puncturing of a lung is a possibility, every time a needle is introduced in  the area of the chest or upper back.  Pneumothorax refers to free air around the  collapsed lung(s), inside of the thoracic cavity (chest cavity).  Another two possible  complications related to a similar event would include: Hemothorax and Chylothorax.   These are variations of the Pneumothorax, where instead of air around the collapsed  lung(s), you may have blood or chyle, respectively.       6. Spinal headaches: They may occur with any procedures in the area of the spine.       7. Persistent CSF (Cerebro-Spinal Fluid) leakage: This is a rare problem, but may occur  with prolonged intrathecal or epidural catheters either due to the formation of a fistulous  track or a dural tear.       8. Nerve damage: By working so close to the spinal cord, there is always a possibility of  nerve damage, which could be as serious as a permanent spinal cord injury with  paralysis.       9. Death:  Although rare, severe deadly allergic reactions known as "Anaphylactic  reaction" can occur to any of the medications used.      10. Worsening of the symptoms:  We can always make thing worse.  What are the chances of something like this happening? Chances of any of this occuring are extremely low.  By statistics, you have more of a chance of getting killed in a motor vehicle accident: while driving to the hospital than any of the above occurring .  Nevertheless, you should be aware that they are possibilities.  In general, it is similar to taking a shower.  Everybody knows that you can slip, hit your head and get killed.  Does that mean that you should  not shower again?  Nevertheless always keep in mind that statistics do not mean anything if you happen to be on the wrong side of them.  Even if a procedure has a 1 (one) in a 1,000,000 (million) chance of going wrong, it you happen to be that one..Also, keep in mind that by statistics, you have more of a chance of having something go wrong when taking medications.  Who should not have this procedure? If you are on a blood thinning medication (e.g. Coumadin, Plavix, see list of "Blood Thinners"), or if you have an active infection going on, you should not have the procedure.  If you are taking any blood thinners, please inform your physician.  How should I prepare for this procedure?  Do not eat or drink anything at least six hours prior to the procedure.  Bring a driver  with you .  It cannot be a taxi.  Come accompanied by an adult that can drive you back, and that is strong enough to help you if your legs get weak or numb from the local anesthetic.  Take all of your medicines the morning of the procedure with just enough water to swallow them.  If you have diabetes, make sure that you are scheduled to have your procedure done first thing in the morning, whenever possible.  If you have diabetes, take only half of your insulin dose and notify our nurse that you have done so as soon as you arrive at the clinic.  If you are diabetic, but only take blood sugar pills (oral hypoglycemic), then do not take them on the morning of your procedure.  You may take them after you have had the procedure.  Do not take aspirin or any aspirin-containing medications, at least eleven (11) days prior to the procedure.  They may prolong bleeding.  Wear loose fitting clothing that may be easy to take off and that you would not mind if it got stained with Betadine or blood.  Do not wear any jewelry or perfume  Remove any nail coloring.  It will interfere with some of our monitoring equipment.  NOTE: Remember  that this is not meant to be interpreted as a complete list of all possible complications.  Unforeseen problems may occur.  BLOOD THINNERS The following drugs contain aspirin or other products, which can cause increased bleeding during surgery and should not be taken for 2 weeks prior to and 1 week after surgery.  If you should need take something for relief of minor pain, you may take acetaminophen which is found in Tylenol,m Datril, Anacin-3 and Panadol. It is not blood thinner. The products listed below are.  Do not take any of the products listed below in addition to any listed on your instruction sheet.  A.P.C or A.P.C with Codeine Codeine Phosphate Capsules #3 Ibuprofen Ridaura  ABC compound Congesprin Imuran rimadil  Advil Cope Indocin Robaxisal  Alka-Seltzer Effervescent Pain Reliever and Antacid Coricidin or Coricidin-D  Indomethacin Rufen  Alka-Seltzer plus Cold Medicine Cosprin Ketoprofen S-A-C Tablets  Anacin Analgesic Tablets or Capsules Coumadin Korlgesic Salflex  Anacin Extra Strength Analgesic tablets or capsules CP-2 Tablets Lanoril Salicylate  Anaprox Cuprimine Capsules Levenox Salocol  Anexsia-D Dalteparin Magan Salsalate  Anodynos Darvon compound Magnesium Salicylate Sine-off  Ansaid Dasin Capsules Magsal Sodium Salicylate  Anturane Depen Capsules Marnal Soma  APF Arthritis pain formula Dewitt's Pills Measurin Stanback  Argesic Dia-Gesic Meclofenamic Sulfinpyrazone  Arthritis Bayer Timed Release Aspirin Diclofenac Meclomen Sulindac  Arthritis pain formula Anacin Dicumarol Medipren Supac  Analgesic (Safety coated) Arthralgen Diffunasal Mefanamic Suprofen  Arthritis Strength Bufferin Dihydrocodeine Mepro Compound Suprol  Arthropan liquid Dopirydamole Methcarbomol with Aspirin Synalgos  ASA tablets/Enseals Disalcid Micrainin Tagament  Ascriptin Doan's Midol Talwin  Ascriptin A/D Dolene Mobidin Tanderil  Ascriptin Extra Strength Dolobid Moblgesic Ticlid  Ascriptin with  Codeine Doloprin or Doloprin with Codeine Momentum Tolectin  Asperbuf Duoprin Mono-gesic Trendar  Aspergum Duradyne Motrin or Motrin IB Triminicin  Aspirin plain, buffered or enteric coated Durasal Myochrisine Trigesic  Aspirin Suppositories Easprin Nalfon Trillsate  Aspirin with Codeine Ecotrin Regular or Extra Strength Naprosyn Uracel  Atromid-S Efficin Naproxen Ursinus  Auranofin Capsules Elmiron Neocylate Vanquish  Axotal Emagrin Norgesic Verin  Azathioprine Empirin or Empirin with Codeine Normiflo Vitamin E  Azolid Emprazil Nuprin Voltaren  Bayer Aspirin plain, buffered or children's or timed BC Tablets or powders  Encaprin Orgaran Warfarin Sodium  Buff-a-Comp Enoxaparin Orudis Zorpin  Buff-a-Comp with Codeine Equegesic Os-Cal-Gesic   Buffaprin Excedrin plain, buffered or Extra Strength Oxalid   Bufferin Arthritis Strength Feldene Oxphenbutazone   Bufferin plain or Extra Strength Feldene Capsules Oxycodone with Aspirin   Bufferin with Codeine Fenoprofen Fenoprofen Pabalate or Pabalate-SF   Buffets II Flogesic Panagesic   Buffinol plain or Extra Strength Florinal or Florinal with Codeine Panwarfarin   Buf-Tabs Flurbiprofen Penicillamine   Butalbital Compound Four-way cold tablets Penicillin   Butazolidin Fragmin Pepto-Bismol   Carbenicillin Geminisyn Percodan   Carna Arthritis Reliever Geopen Persantine   Carprofen Gold's salt Persistin   Chloramphenicol Goody's Phenylbutazone   Chloromycetin Haltrain Piroxlcam   Clmetidine heparin Plaquenil   Cllnoril Hyco-pap Ponstel   Clofibrate Hydroxy chloroquine Propoxyphen         Before stopping any of these medications, be sure to consult the physician who ordered them.  Some, such as Coumadin (Warfarin) are ordered to prevent or treat serious conditions such as "deep thrombosis", "pumonary embolisms", and other heart problems.  The amount of time that you may need off of the medication may also vary with the medication and the reason for  which you were taking it.  If you are taking any of these medications, please make sure you notify your pain physician before you undergo any procedures.

## 2015-05-29 NOTE — Progress Notes (Signed)
Subjective:    Patient ID: Jill Rivas, female    DOB: 11-21-1970, 44 y.o.   MRN: [id number]  HPI Patient is 44 year old female returns to pain management for further evaluation and treatment of pain involving the neck and upper extremity regions. Patient is with some weakness of the left upper extremity and is been encouraged to follow-up with neurosurgeon for further assessment of the pain of the cervical and upper extremity regions. MRIs also been reviewed with patient. Patient states that pain involving the left lower extremity has began to return. There is concern regarding component of complex regional pain syndrome involving the left lower extremity. We will proceed with interventional treatment consisted with lumbosacral selective nerve root block at time return appointment in attempt to decrease severity of the pain of the left lower extremity. Has been concern regarding both synthetic mediated pain and sympathetic independent pain contribute to patient's symptoms and patient has had significant relief following lumbosacral selective nerve root blocks. We will proceed with interventional treatment as discussed and patient will undergo follow-up neurosurgical evaluation of pain of the cervical and upper extremity regions as discussed. The patient was in agreement with suggested treatment plan.      Review of Systems     Objective:   Physical Exam  There was moderate tenderness to palpation of the splenius capitate and occipitalis region. There was limited range of motion of the cervical spine. Patient was with decreased grip strength on the left compared to the right. There appeared to be questionably positive Spurling's maneuver. No definite sensory deficit of the upper extremities were noted. There was tenderness to palpation over the thoracic facet thoracic paraspinal musculature region. Palpation over the region of the cervical facet cervical paraspinal muscle and thoracic  facet thoracic paraspinal muscles reproduced moderate discomfort. Tinel and Phalen's maneuver were without increase of pain of significant degree. Palpation over the thoracic facet thoracic paraspinal musculature region was tends to palpation the lower thoracic region a moderate degree. No crepitus of the thoracic region noted. Palpation over the lumbar paraspinal muscle lumbar facet region was with moderate moderately severe discomfort left greater than the right. There was slight cyanotic hue of the left lower extremity with allodynia of the left lower extremity. Decreased EHL strength on the left compared to the right and and negative Homans. There was minimal tenderness of the PSIS PSIS region as well as the greater trochanteric region and iliotibial band region. No sensory deficit of dermatomal distribution of the lower extremities were noted. Abdomen nontender and no costovertebral tenderness was noted.    Assessment & Plan:    Degenerative disc disease cervical spine Degenerative changes cervical spine most notable at the C5-6 level Cervical radiculopathy  Complex regional pain syndrome of left lower extremity  Sacroiliac joint dysfunction  Bilateral occipital neuralgia   PLAN   Continue present medication Topamax and oxycodone  Lumbosacral selective nerve root block to be performed at time return appointment. The patient has had more significant relief of pain following lumbosacral selective nerve root block. There is concern regarding both sympathetically mediated pain and sympathetic independent pain contributing to patient's symptomatology  F/U PCP Dr. Lennox Grumbles for evaliation of  BP and general medical  condition  F/U surgical evaluation. May consider pending follow-up evaluations  F/U neurological evaluation. May consider pending follow-up evaluations  May consider radiofrequency rhizolysis or intraspinal procedures pending response to present treatment and F/U evaluation    Patient to call Pain Management Center should patient have concerns  prior to scheduled return appointment.

## 2015-05-29 NOTE — Progress Notes (Signed)
Safety precautions to be maintained throughout the outpatient stay will include: orient to surroundings, keep bed in low position, maintain call bell within reach at all times, provide assistance with transfer out of bed and ambulation.  

## 2015-05-30 ENCOUNTER — Ambulatory Visit: Payer: Medicare Other | Admitting: Pain Medicine

## 2015-06-11 ENCOUNTER — Ambulatory Visit: Payer: Medicare Other | Attending: Pain Medicine | Admitting: Pain Medicine

## 2015-06-11 ENCOUNTER — Encounter: Payer: Self-pay | Admitting: Pain Medicine

## 2015-06-11 VITALS — BP 100/60 | HR 63 | Temp 97.4°F | Resp 12 | Ht 67.0 in | Wt 195.0 lb

## 2015-06-11 DIAGNOSIS — M5010 Cervical disc disorder with radiculopathy, unspecified cervical region: Secondary | ICD-10-CM

## 2015-06-11 DIAGNOSIS — M79604 Pain in right leg: Secondary | ICD-10-CM | POA: Diagnosis present

## 2015-06-11 DIAGNOSIS — M5137 Other intervertebral disc degeneration, lumbosacral region: Secondary | ICD-10-CM

## 2015-06-11 DIAGNOSIS — M545 Low back pain: Secondary | ICD-10-CM | POA: Diagnosis present

## 2015-06-11 DIAGNOSIS — M5136 Other intervertebral disc degeneration, lumbar region: Secondary | ICD-10-CM | POA: Diagnosis not present

## 2015-06-11 DIAGNOSIS — M79605 Pain in left leg: Secondary | ICD-10-CM | POA: Diagnosis present

## 2015-06-11 DIAGNOSIS — G90522 Complex regional pain syndrome I of left lower limb: Secondary | ICD-10-CM

## 2015-06-11 DIAGNOSIS — M5030 Other cervical disc degeneration, unspecified cervical region: Secondary | ICD-10-CM

## 2015-06-11 DIAGNOSIS — M51379 Other intervertebral disc degeneration, lumbosacral region without mention of lumbar back pain or lower extremity pain: Secondary | ICD-10-CM

## 2015-06-11 DIAGNOSIS — M533 Sacrococcygeal disorders, not elsewhere classified: Secondary | ICD-10-CM

## 2015-06-11 MED ORDER — FENTANYL CITRATE (PF) 100 MCG/2ML IJ SOLN
INTRAMUSCULAR | Status: AC
  Administered 2015-06-11: 100 ug via INTRAVENOUS
  Filled 2015-06-11: qty 2

## 2015-06-11 MED ORDER — ORPHENADRINE CITRATE 30 MG/ML IJ SOLN
INTRAMUSCULAR | Status: AC
  Administered 2015-06-11: 60 mg
  Filled 2015-06-11: qty 2

## 2015-06-11 MED ORDER — MIDAZOLAM HCL 5 MG/5ML IJ SOLN
INTRAMUSCULAR | Status: AC
  Administered 2015-06-11: 5 mg via INTRAVENOUS
  Filled 2015-06-11: qty 5

## 2015-06-11 MED ORDER — CEFAZOLIN SODIUM 1 G IJ SOLR
INTRAMUSCULAR | Status: AC
  Filled 2015-06-11: qty 10

## 2015-06-11 MED ORDER — TRIAMCINOLONE ACETONIDE 40 MG/ML IJ SUSP
INTRAMUSCULAR | Status: AC
  Administered 2015-06-11: 40 mg
  Filled 2015-06-11: qty 1

## 2015-06-11 MED ORDER — BUPIVACAINE HCL (PF) 0.25 % IJ SOLN
INTRAMUSCULAR | Status: AC
  Administered 2015-06-11: 30 mL
  Filled 2015-06-11: qty 30

## 2015-06-11 NOTE — Patient Instructions (Addendum)
PLAN   Continue present medication Topamax oxycodone  F/U PCP Dr. Lennox Grumbles evaliation of  BP and general medical  condition  F/U surgical evaluation as discussed  F/U neurological evaluation. May consider pending follow-up evaluations  May consider radiofrequency rhizolysis or intraspinal procedures pending response to present treatment and F/U evaluation   Patient to call Pain Management Center should patient have concerns prior to scheduled return appointment. Selective Nerve Root Block Patient Information  Description: Specific nerve roots exit the spinal canal and these nerves can be compressed and inflamed by a bulging disc and bone spurs.  By injecting steroids on the nerve root, we can potentially decrease the inflammation surrounding these nerves, which often leads to decreased pain.  Also, by injecting local anesthesia on the nerve root, this can provide [place] helpful information to give to your referring doctor if it decreases your pain.  Selective nerve root blocks can be done along the spine from the neck to the low back depending on the location of your pain.   After numbing the skin with local anesthesia, a small needle is passed to the nerve root and the position of the needle is verified using x-ray pictures.  After the needle is in correct position, we then deposit the medication.  You may experience a pressure sensation while this is being done.  The entire block usually lasts less than 15 minutes.  Conditions that may be treated with selective nerve root blocks:  Low back and leg pain  Spinal stenosis  Diagnostic block prior to potential surgery  Neck and arm pain  Post laminectomy syndrome  Preparation for the injection:  1. Do not eat any solid food or dairy products within 6 hours of your appointment. 2. You may drink clear liquids up to 2 hours before an appointment.  Clear liquids include water, black coffee, juice or soda.  No milk or cream please. 3. You may  take your regular medications, including pain medications, with a sip of water before your appointment.  Diabetics should hold regular insulin (if taken separately) and take 1/2 normal NPH dose the morning of the procedure.  Carry some sugar containing items with you to your appointment. 4. A driver must accompany you and be prepared to drive you home after your procedure. 5. Bring all your current medications with you. 6. An IV may be inserted and sedation may be given at the discretion of the physician. 7. A blood pressure cuff, EKG, and other monitors will often be applied during the procedure.  Some patients may need to have extra oxygen administered for a short period. 8. You will be asked to provide medical information, including allergies, prior to the procedure.  We must know immediately if you are taking blood  Thinners (like Coumadin) or if you are allergic to IV iodine contrast (dye).  Possible side-effects: All are usually temporary  Bleeding from needle site  Light headedness  Numbness and tingling  Decreased blood pressure  Weakness in arms/legs  Pressure sensation in back/neck  Pain at injection site (several days)  Possible complications: All are extremely rare  Infection  Nerve injury  Spinal headache (a headache wore with upright position)  Call if you experience:  Fever/chills associated with headache or increased back/neck pain  Headache worsened by an upright position  New onset weakness or numbness of an extremity below the injection site  Hives or difficulty breathing (go to the emergency room)  Inflammation or drainage at the injection site(s)  Severe  back/neck pain greater than usual  New symptoms which are concerning to you  Please note:  Although the local anesthetic injected can often make your back or neck feel good for several hours after the injection the pain will likely return.  It takes 3-5 days for steroids to work on the nerve  root. You may not notice any pain relief for at least one week.  If effective, we will often do a series of 3 injections spaced 3-6 weeks apart to maximally decrease your pain.    If you have any questions, please call [phone]  [facility]  General Discharge Instructions :  If you need to reach your doctor call: Monday-Friday 8:00 am - 4:00 pm at [phone] or toll free [phone].  After clinic hours [phone] to have operator reach doctor.  Bring all of your medication bottles to all your appointments in the pain clinic.  To cancel or reschedule your appointment with Pain Management please remember to call 24 hours in advance to avoid a fee.  Refer to the educational materials which you have been given on: General Risks, I had my Procedure. Discharge Instructions, Post Sedation.  Post Procedure Instructions:  The drugs you were given will stay in your system until tomorrow, so for the next 24 hours you should not drive, make any legal decisions or drink any alcoholic beverages.  You may eat anything you prefer, but it is better to start with liquids then soups and crackers, and gradually work up to solid foods.  Please notify your doctor immediately if you have any unusual bleeding, trouble breathing or pain that is not related to your normal pain.  Depending on the type of procedure that was done, some parts of your body may feel week and/or numb.  This usually clears up by tonight or the next day.  Walk with the use of an assistive device or accompanied by an adult for the 24 hours.  You may use ice on the affected area for the first 24 hours.  Put ice in a Ziploc bag and cover with a towel and place against area 15 minutes on 15 minutes off.  You may switch to heat after 24 hours.

## 2015-06-11 NOTE — Progress Notes (Signed)
   Subjective:    Patient ID: Jill Rivas, female    DOB: 08/03/1971, 43 y.o.   MRN: [id number]  HPI    Review of Systems     Objective:   Physical Exam        Assessment & Plan:   

## 2015-06-11 NOTE — Progress Notes (Signed)
Subjective:    Patient ID: Jill Rivas, female    DOB: 05-Mar-1971, 44 y.o.   MRN: [id number]  HPI  PROCEDURE PERFORMED: Lumbosacral selective nerve root block   NOTE: The patient is a 44 y.o. female who returns to [place] for further evaluation and treatment of pain involving the lumbar and lower extremity region. Studies consisting of MRI has revealed the patient to be with evidence of degenerative disc disease of the lumbar spine. The patient is status post trauma and surgery of the left lower extremity. There is been concern regarding component of pain due to complex regional pain syndrome with both sympathetically mediated pain and sympathetic independent pain contributing to patient's symptomatology. The risks, benefits, and expectations of the procedure have been explained to the patient who was understanding and in agreement with suggested treatment plan. We will proceed with interventional treatment as discussed and as explained to the patient. The patient is understanding and in agreement with suggested treatment plan.   DESCRIPTION OF PROCEDURE: Lumbosacral selective nerve root block with IV Versed, IV fentanyl conscious sedation, EKG, blood pressure, pulse, and pulse oximetry monitoring. The procedure was performed with the patient in the prone position under fluoroscopic guidance. With the patient in the prone position, Betadine prep of proposed entry site was performed. Local anesthetic skin wheal of proposed needle entry site was prepared with 1.5% plain lidocaine with AP view of the lumbosacral spine.   PROCEDURE #1: Needle placement at the left L2 vertebral body: A 22 -gauge needle was inserted at the inferior border of the transverse process of the vertebral body with needle placed medial to the midline of the transverse process on AP view of the lumbosacral spine.   NEEDLE PLACEMENT AT  L3, L4, and L5  VERTEBRAL BODY LEVELS  Needle  placement was  accomplished at L3, L4, and L5  vertebral body levels on the left side exactly as was accomplished at the L2  vertebral body level  and utilizing the same technique and under fluoroscopic guidance.    Needle placement was then verified on lateral view at all levels with needle tip documented to be in the posterior superior quadrant of the intervertebral foramen of  L 2, L3, L4, and L5. Following negative aspiration for heme and CSF at each level, each level was injected with 3 mL of 0.25% bupivacaine with Kenalog.       The patient tolerated the procedure well. A total of 10 mg of Kenalog was utilized for the procedure.   PLAN:  1. Medications: Will continue presently prescribed medications.Topamax and oxycodone 2. The patient is to undergo follow-up evaluation with PCP Dr. Lennox Grumbles for evaluation of blood pressure and general medical condition status post procedure performed on today's visit. 3. Surgical follow-up evaluation. Patient will undergo surgical follow-up evaluation of pain of the neck and upper extremities as planned. 4. Neurological evaluation.. May consider [facility] EMG studies and other studies as discussed 5. May consider radiofrequency procedures, implantation type procedures and other treatment pending response to treatment and follow-up evaluation. 6. The patient has been advise do adhere to proper body mechanics and avoid activities which may aggravate condition. 7. The patient has been advised to call the [facility] prior to scheduled return appointment should there be significant change in the patient's condition or should the patient have other concerns regarding condition prior to scheduled return appointment.   Review of Systems     Objective:   Physical Exam  Assessment & Plan:

## 2015-06-11 NOTE — Progress Notes (Signed)
Safety precautions to be maintained throughout the outpatient stay will include: orient to surroundings, keep bed in low position, maintain call bell within reach at all times, provide assistance with transfer out of bed and ambulation.  

## 2015-06-12 ENCOUNTER — Telehealth: Payer: Self-pay | Admitting: *Deleted

## 2015-06-12 NOTE — Telephone Encounter (Signed)
Patient denies having any problems from procedure.

## 2015-06-28 ENCOUNTER — Ambulatory Visit: Payer: Medicare Other | Attending: Pain Medicine | Admitting: Pain Medicine

## 2015-06-28 ENCOUNTER — Encounter: Payer: Self-pay | Admitting: Pain Medicine

## 2015-06-28 VITALS — BP 94/66 | HR 88 | Temp 98.4°F | Resp 18 | Ht 67.0 in | Wt 200.0 lb

## 2015-06-28 DIAGNOSIS — M79604 Pain in right leg: Secondary | ICD-10-CM | POA: Diagnosis present

## 2015-06-28 DIAGNOSIS — M5481 Occipital neuralgia: Secondary | ICD-10-CM | POA: Insufficient documentation

## 2015-06-28 DIAGNOSIS — G90522 Complex regional pain syndrome I of left lower limb: Secondary | ICD-10-CM | POA: Insufficient documentation

## 2015-06-28 DIAGNOSIS — M545 Low back pain: Secondary | ICD-10-CM | POA: Diagnosis present

## 2015-06-28 DIAGNOSIS — M5030 Other cervical disc degeneration, unspecified cervical region: Secondary | ICD-10-CM | POA: Insufficient documentation

## 2015-06-28 DIAGNOSIS — M47892 Other spondylosis, cervical region: Secondary | ICD-10-CM | POA: Diagnosis not present

## 2015-06-28 DIAGNOSIS — M533 Sacrococcygeal disorders, not elsewhere classified: Secondary | ICD-10-CM | POA: Diagnosis not present

## 2015-06-28 DIAGNOSIS — M5010 Cervical disc disorder with radiculopathy, unspecified cervical region: Secondary | ICD-10-CM

## 2015-06-28 DIAGNOSIS — M79605 Pain in left leg: Secondary | ICD-10-CM | POA: Diagnosis present

## 2015-06-28 DIAGNOSIS — M5137 Other intervertebral disc degeneration, lumbosacral region: Secondary | ICD-10-CM

## 2015-06-28 MED ORDER — OXYCODONE HCL 10 MG PO TABS: ORAL_TABLET | ORAL | Status: DC

## 2015-06-28 MED ORDER — TOPIRAMATE 50 MG PO TABS: ORAL_TABLET | ORAL | Status: DC

## 2015-06-28 NOTE — Patient Instructions (Addendum)
PLAN   Continue present medication Topamax and oxycodone   F/U PCP Dr. Posey Pronto  for evaliation of low BP and general medical  Condition as discussed  F/U surgical evaluation as discussed. Consider neurosurgical reevaluation especially of left upper extremity weakness as we discussed  F/U neurological evaluation. May consider pending follow-up evaluations  May consider radiofrequency rhizolysis or intraspinal procedures pending response to present treatment and F/U evaluation   Patient to call Pain Management Center should patient have concerns prior to scheduled return appointment.

## 2015-06-28 NOTE — Progress Notes (Signed)
Safety precautions to be maintained throughout the outpatient stay will include: orient to surroundings, keep bed in low position, maintain call bell within reach at all times, provide assistance with transfer out of bed and ambulation.  

## 2015-06-28 NOTE — Progress Notes (Signed)
   Subjective:    Patient ID: Jill Rivas, female    DOB: 10/23/1970, 44 y.o.   MRN: [id number]  HPI Patient is 44 year old female who returns to [place] for further evaluation and treatment of pain involving the lower back lower extremity region especially the left lower extremity with concern regarding component of complex regional pain syndrome contributing to left lower extremity pain which is improved significantly following previous lumbosacral selective nerve root block. At the present time patient also is with improvement of her pain of the cervical and upper extremity regions. We discussed surgeon surgical evaluation of the upper extremity weakness of the left upper extremities which patient referred to delay at this time. We will continue medications Topamax oxycodone and patient is to call pain management should there be change in condition prior to scheduled return appointment.   Review of Systems     Objective:   Physical Exam  There was tenderness of the splenius capitis and occipitalis musculature region of mild degree. Patient was with decreased grip strength on the left compared to the right with unremarkable Spurling's maneuver. Patient was with range of motion maneuvers of the upper extremities and range of motion maneuvers of the cervical region without producing significant pain. There was tends to palpation over the region of the acromioclavicular and glenohumeral joint region is a minimal degree. Palpation of the thoracic facet thoracic paraspinal musculature region reproduced minimal discomfort with no crepitus of the thoracic region noted. Palpation of the lumbar paraspinal muscles lumbar facet region associated take to palpation of mild degree. Lateral bending rotation and extension palpation of the lumbar facets reproduce mild discomfort. There was mild tends to palpation of the right lower extremity and mild to moderate tends to palpation of the left  lower extremity with slight cyanotic you have the left lower extremity compared to the right lower extremity with decreased EHL strength on the left compared to the right. There was negative Homans. No sensory deficit of dermatomal distribution of the lower extremities noted UPPER extremities noted. There was no abdominal tends to palpation and no costovertebral angle tenderness noted    ASSESSMENT   Degenerative disc disease cervical spine Degenerative changes cervical spine most notable at the C5-6 level Cervical radiculopathy  Complex regional pain syndrome of left lower extremity  Sacroiliac joint dysfunction  Bilateral occipital neuralgia      Assessment & Plan:    PLAN   Continue present medication Topamax and oxycodone  F/U PCP Dr. Posey Pronto for evaliation of  BP and general medical  condition  F/U surgical evaluation especially of the cervical and upper extremity region as discussed  F/U neurological evaluation. May consider pending follow-up evaluations  May consider radiofrequency rhizolysis or intraspinal procedures pending response to present treatment and F/U evaluation   Patient to call Pain Management Center should patient have concerns prior to scheduled return appointment.

## 2015-07-26 ENCOUNTER — Ambulatory Visit: Payer: Medicare Other | Attending: Pain Medicine | Admitting: Pain Medicine

## 2015-07-26 ENCOUNTER — Encounter: Payer: Self-pay | Admitting: Pain Medicine

## 2015-07-26 VITALS — BP 102/82 | HR 94 | Temp 98.2°F | Resp 16 | Ht 67.0 in | Wt 195.0 lb

## 2015-07-26 DIAGNOSIS — M5030 Other cervical disc degeneration, unspecified cervical region: Secondary | ICD-10-CM | POA: Diagnosis not present

## 2015-07-26 DIAGNOSIS — G90522 Complex regional pain syndrome I of left lower limb: Secondary | ICD-10-CM | POA: Insufficient documentation

## 2015-07-26 DIAGNOSIS — M533 Sacrococcygeal disorders, not elsewhere classified: Secondary | ICD-10-CM | POA: Diagnosis not present

## 2015-07-26 DIAGNOSIS — M47892 Other spondylosis, cervical region: Secondary | ICD-10-CM | POA: Diagnosis not present

## 2015-07-26 DIAGNOSIS — M5416 Radiculopathy, lumbar region: Secondary | ICD-10-CM

## 2015-07-26 DIAGNOSIS — M5010 Cervical disc disorder with radiculopathy, unspecified cervical region: Secondary | ICD-10-CM

## 2015-07-26 DIAGNOSIS — M542 Cervicalgia: Secondary | ICD-10-CM | POA: Diagnosis present

## 2015-07-26 DIAGNOSIS — M5137 Other intervertebral disc degeneration, lumbosacral region: Secondary | ICD-10-CM

## 2015-07-26 DIAGNOSIS — M79601 Pain in right arm: Secondary | ICD-10-CM | POA: Diagnosis present

## 2015-07-26 DIAGNOSIS — M79602 Pain in left arm: Secondary | ICD-10-CM | POA: Diagnosis present

## 2015-07-26 DIAGNOSIS — M47816 Spondylosis without myelopathy or radiculopathy, lumbar region: Secondary | ICD-10-CM

## 2015-07-26 MED ORDER — OXYCODONE HCL 10 MG PO TABS: ORAL_TABLET | ORAL | Status: DC

## 2015-07-26 MED ORDER — TOPIRAMATE 50 MG PO TABS: ORAL_TABLET | ORAL | Status: DC

## 2015-07-26 NOTE — Progress Notes (Signed)
   Subjective:    Patient ID: Jill Rivas, female    DOB: 07-05-1971, 44 y.o.   MRN: [id number]  HPI  The patient is a 44 year old female who returns to pain management for evaluation and treatment of pain involving the neck and upper extremity region as well as the lower back and lower extremity region. Patient attempted to hip pain with some heavy items over the past few weeks and stated that she diet heavy objects and this is cause some significant pain of the neck and upper extremity region. Patient also has lower back and lower extremity. Pain. 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 There was limited range of motion of the cervical spine. There was questionably positive Spurling's maneuver. There was decreased grip strength on the left compared to the right with questionable decreased sensation of the C6 dermatomal region. Palpation of the thoracic facet thoracic paraspinal muscles reproduces pain of moderate degree. Palpation over the lumbar paraspinal muscles lumbar facet regions reproduce moderate discomfort. There was decreased EHL strength on the left compared to the right with slight cyanotic hue of the left compared to the right foot. Increased pain with palpation of the left lower extremity with negative clonus negative Homans. Abdomen was nontender with no costovertebral tenderness noted.       Assessment & Plan:     Degenerative disc disease cervical spine Degenerative changes cervical spine most notable at the C5-6 level  Cervical radiculopathy  Complex regional pain syndrome of left lower extremity  Sacroiliac joint dysfunction     PLAN   Continue present medication Topamax and  oxycodone  Cervical epidural steroid injection to be performed at time of return appointment  F/U PCP Dr. Posey Pronto for evaliation of  BP and general medical  condition  F/U surgical evaluation especially of the cervical and upper extremity region as discussed  F/U neurological evaluation. May consider pending follow-up evaluations  May consider radiofrequency rhizolysis or intraspinal procedures pending response to present treatment and F/U evaluation   Patient to call Pain Management Center should patient have concerns prior to scheduled return appointment.  Bilateral occipital neuralgia

## 2015-07-26 NOTE — Patient Instructions (Addendum)
PLAN   Continue present medication Topamax and oxycodone   Lumbar epidural steroid injection to be performed at time of return appointment  F/U PCP Dr. Posey Pronto  for evaliation of  BP and general medical  condition as discussed  F/U surgical evaluation as discussed. Consider neurosurgical reevaluation especially of left upper extremity weakness as we discussed  F/U neurological evaluation. May consider pending follow-up evaluations  May consider radiofrequency rhizolysis or intraspinal procedures pending response to present treatment and F/U evaluation   Patient to call [facility] Management Center should patient have concerns prior to scheduled return appointment.GENERAL RISKS AND COMPLICATIONS  What are the risk, side effects and possible complications? Generally speaking, most procedures are safe.  However, with any procedure there are risks, side effects, and the possibility of complications.  The risks and complications are dependent upon the sites that are lesioned, or the type of nerve block to be performed.  The closer the procedure is to the spine, the more serious the risks are.  Great care is taken when placing the radio frequency needles, block needles or lesioning probes, but sometimes complications can occur. 1. Infection: Any time there is an injection through the skin, there is a risk of infection.  This is why sterile conditions are used for these blocks.  There are four possible types of infection. 1. Localized skin infection. 2. Central Nervous System Infection-This can be in the form of Meningitis, which can be deadly. 3. Epidural Infections-This can be in the form of an epidural abscess, which can cause pressure inside of the spine, causing compression of the spinal cord with subsequent paralysis. This would require an emergency surgery to decompress, and there are no guarantees that the patient would recover from the paralysis. 4. Discitis-This is an infection of the  intervertebral discs.  It occurs in about 1% of discography procedures.  It is difficult to treat and it may lead to surgery.        2. Pain: the needles have to go through skin and soft tissues, will cause soreness.       3. Damage to internal structures:  The nerves to be lesioned may be near blood vessels or    other nerves which can be potentially damaged.       4. Bleeding: Bleeding is more common if the patient is taking blood thinners such as  aspirin, Coumadin, Ticiid, Plavix, etc., or if he/she have some genetic predisposition  such as hemophilia. Bleeding into the spinal canal can cause compression of the spinal  cord with subsequent paralysis.  This would require an emergency surgery to  decompress and there are no guarantees that the patient would recover from the  paralysis.       5. Pneumothorax:  Puncturing of a lung is a possibility, every time a needle is introduced in  the area of the chest or upper back.  Pneumothorax refers to free air around the  collapsed lung(s), inside of the thoracic cavity (chest cavity).  Another two possible  complications related to a similar event would include: Hemothorax and Chylothorax.   These are variations of the Pneumothorax, where instead of air around the collapsed  lung(s), you may have blood or chyle, respectively.       6. Spinal headaches: They may occur with any procedures in the area of the spine.       7. Persistent CSF (Cerebro-Spinal Fluid) leakage: This is a rare problem, but may occur  with prolonged intrathecal or epidural  catheters either due to the formation of a fistulous  track or a dural tear.       8. Nerve damage: By working so close to the spinal cord, there is always a possibility of  nerve damage, which could be as serious as a permanent spinal cord injury with  paralysis.       9. Death:  Although rare, severe deadly allergic reactions known as "Anaphylactic  reaction" can occur to any of the medications used.       10. Worsening of the symptoms:  We can always make thing worse.  What are the chances of something like this happening? Chances of any of this occuring are extremely low.  By statistics, you have more of a chance of getting killed in a motor vehicle accident: while driving to the hospital than any of the above occurring .  Nevertheless, you should be aware that they are possibilities.  In general, it is similar to taking a shower.  Everybody knows that you can slip, hit your head and get killed.  Does that mean that you should not shower again?  Nevertheless always keep in mind that statistics do not mean anything if you happen to be on the wrong side of them.  Even if a procedure has a 1 (one) in a 1,000,000 (million) chance of going wrong, it you happen to be that one..Also, keep in mind that by statistics, you have more of a chance of having something go wrong when taking medications.  Who should not have this procedure? If you are on a blood thinning medication (e.g. Coumadin, Plavix, see list of "Blood Thinners"), or if you have an active infection going on, you should not have the procedure.  If you are taking any blood thinners, please inform your physician.  How should I prepare for this procedure?  Do not eat or drink anything at least six hours prior to the procedure.  Bring a driver with you .  It cannot be a taxi.  Come accompanied by an adult that can drive you back, and that is strong enough to help you if your legs get weak or numb from the local anesthetic.  Take all of your medicines the morning of the procedure with just enough water to swallow them.  If you have diabetes, make sure that you are scheduled to have your procedure done first thing in the morning, whenever possible.  If you have diabetes, take only half of your insulin dose and notify our nurse that you have done so as soon as you arrive at the clinic.  If you are diabetic, but only take blood sugar pills (oral  hypoglycemic), then do not take them on the morning of your procedure.  You may take them after you have had the procedure.  Do not take aspirin or any aspirin-containing medications, at least eleven (11) days prior to the procedure.  They may prolong bleeding.  Wear loose fitting clothing that may be easy to take off and that you would not mind if it got stained with Betadine or blood.  Do not wear any jewelry or perfume  Remove any nail coloring.  It will interfere with some of our monitoring equipment.  NOTE: Remember that this is not meant to be interpreted as a complete list of all possible complications.  Unforeseen problems may occur.  BLOOD THINNERS The following drugs contain aspirin or other products, which can cause increased bleeding during surgery and should not be taken  for 2 weeks prior to and 1 week after surgery.  If you should need take something for relief of minor pain, you may take acetaminophen which is found in Tylenol,m Datril, Anacin-3 and Panadol. It is not blood thinner. The products listed below are.  Do not take any of the products listed below in addition to any listed on your instruction sheet.  A.P.C or A.P.C with Codeine Codeine Phosphate Capsules #3 Ibuprofen Ridaura  ABC compound Congesprin Imuran rimadil  Advil Cope Indocin Robaxisal  Alka-Seltzer Effervescent Pain Reliever and Antacid Coricidin or Coricidin-D  Indomethacin Rufen  Alka-Seltzer plus Cold Medicine Cosprin Ketoprofen S-A-C Tablets  Anacin Analgesic Tablets or Capsules Coumadin Korlgesic Salflex  Anacin Extra Strength Analgesic tablets or capsules CP-2 Tablets Lanoril Salicylate  Anaprox Cuprimine Capsules Levenox Salocol  Anexsia-D Dalteparin Magan Salsalate  Anodynos Darvon compound Magnesium Salicylate Sine-off  Ansaid Dasin Capsules Magsal Sodium Salicylate  Anturane Depen Capsules Marnal Soma  APF Arthritis pain formula Dewitt's Pills Measurin Stanback  Argesic Dia-Gesic Meclofenamic  Sulfinpyrazone  Arthritis Bayer Timed Release Aspirin Diclofenac Meclomen Sulindac  Arthritis pain formula Anacin Dicumarol Medipren Supac  Analgesic (Safety coated) Arthralgen Diffunasal Mefanamic Suprofen  Arthritis Strength Bufferin Dihydrocodeine Mepro Compound Suprol  Arthropan liquid Dopirydamole Methcarbomol with Aspirin Synalgos  ASA tablets/Enseals Disalcid Micrainin Tagament  Ascriptin Doan's Midol Talwin  Ascriptin A/D Dolene Mobidin Tanderil  Ascriptin Extra Strength Dolobid Moblgesic Ticlid  Ascriptin with Codeine Doloprin or Doloprin with Codeine Momentum Tolectin  Asperbuf Duoprin Mono-gesic Trendar  Aspergum Duradyne Motrin or Motrin IB Triminicin  Aspirin plain, buffered or enteric coated Durasal Myochrisine Trigesic  Aspirin Suppositories Easprin Nalfon Trillsate  Aspirin with Codeine Ecotrin Regular or Extra Strength Naprosyn Uracel  Atromid-S Efficin Naproxen Ursinus  Auranofin Capsules Elmiron Neocylate Vanquish  Axotal Emagrin Norgesic Verin  Azathioprine Empirin or Empirin with Codeine Normiflo Vitamin E  Azolid Emprazil Nuprin Voltaren  Bayer Aspirin plain, buffered or children's or timed BC Tablets or powders Encaprin Orgaran Warfarin Sodium  Buff-a-Comp Enoxaparin Orudis Zorpin  Buff-a-Comp with Codeine Equegesic Os-Cal-Gesic   Buffaprin Excedrin plain, buffered or Extra Strength Oxalid   Bufferin Arthritis Strength Feldene Oxphenbutazone   Bufferin plain or Extra Strength Feldene Capsules Oxycodone with Aspirin   Bufferin with Codeine Fenoprofen Fenoprofen Pabalate or Pabalate-SF   Buffets II Flogesic Panagesic   Buffinol plain or Extra Strength Florinal or Florinal with Codeine Panwarfarin   Buf-Tabs Flurbiprofen Penicillamine   Butalbital Compound Four-way cold tablets Penicillin   Butazolidin Fragmin Pepto-Bismol   Carbenicillin Geminisyn Percodan   Carna Arthritis Reliever Geopen Persantine   Carprofen Gold's salt Persistin   Chloramphenicol Goody's  Phenylbutazone   Chloromycetin Haltrain Piroxlcam   Clmetidine heparin Plaquenil   Cllnoril Hyco-pap Ponstel   Clofibrate Hydroxy chloroquine Propoxyphen         Before stopping any of these medications, be sure to consult the physician who ordered them.  Some, such as Coumadin (Warfarin) are ordered to prevent or treat serious conditions such as "deep thrombosis", "pumonary embolisms", and other heart problems.  The amount of time that you may need off of the medication may also vary with the medication and the reason for which you were taking it.  If you are taking any of these medications, please make sure you notify your pain physician before you undergo any procedures.         Epidural Steroid Injection Patient Information  Description: The epidural space surrounds the nerves as they exit the spinal cord.  In some patients,  the nerves can be compressed and inflamed by a bulging disc or a tight spinal canal (spinal stenosis).  By injecting steroids into the epidural space, we can bring irritated nerves into direct contact with a potentially helpful medication.  These steroids act directly on the irritated nerves and can reduce swelling and inflammation which often leads to decreased pain.  Epidural steroids may be injected anywhere along the spine and from the neck to the low back depending upon the location of your pain.   After numbing the skin with local anesthetic (like Novocaine), a small needle is passed into the epidural space slowly.  You may experience a sensation of pressure while this is being done.  The entire block usually last less than 10 minutes.  Conditions which may be treated by epidural steroids:   Low back and leg pain  Neck and arm pain  Spinal stenosis  Post-laminectomy syndrome  Herpes zoster (shingles) pain  Pain from compression fractures  Preparation for the injection:  1. Do not eat any solid food or dairy products within 6 hours of your  appointment.  2. You may drink clear liquids up to 2 hours before appointment.  Clear liquids include water, black coffee, juice or soda.  No milk or cream please. 3. You may take your regular medication, including pain medications, with a sip of water before your appointment  Diabetics should hold regular insulin (if taken separately) and take 1/2 normal NPH dos the morning of the procedure.  Carry some sugar containing items with you to your appointment. 4. A driver must accompany you and be prepared to drive you home after your procedure.  5. Bring all your current medications with your. 6. An IV may be inserted and sedation may be given at the discretion of the physician.   7. A blood pressure cuff, EKG and other monitors will often be applied during the procedure.  Some patients may need to have extra oxygen administered for a short period. 8. You will be asked to provide medical information, including your allergies, prior to the procedure.  We must know immediately if you are taking blood thinners (like Coumadin/Warfarin)  Or if you are allergic to IV iodine contrast (dye). We must know if you could possible be pregnant.  Possible side-effects:  Bleeding from needle site  Infection (rare, may require surgery)  Nerve injury (rare)  Numbness & tingling (temporary)  Difficulty urinating (rare, temporary)  Spinal headache ( a headache worse with upright posture)  Light -headedness (temporary)  Pain at injection site (several days)  Decreased blood pressure (temporary)  Weakness in arm/leg (temporary)  Pressure sensation in back/neck (temporary)  Call if you experience:  Fever/chills associated with headache or increased back/neck pain.  Headache worsened by an upright position.  New onset weakness or numbness of an extremity below the injection site  Hives or difficulty breathing (go to the emergency room)  Inflammation or drainage at the infection site  Severe  back/neck pain  Any new symptoms which are concerning to you  Please note:  Although the local anesthetic injected can often make your back or neck feel good for several hours after the injection, the pain will likely return.  It takes 3-7 days for steroids to work in the epidural space.  You may not notice any pain relief for at least that one week.  If effective, we will often do a series of three injections spaced 3-6 weeks apart to maximally decrease your pain.  After  the initial series, we generally will wait several months before considering a repeat injection of the same type.  If you have any questions, please call [phone] [facility]

## 2015-07-26 NOTE — Progress Notes (Signed)
Safety precautions to be maintained throughout the outpatient stay will include: orient to surroundings, keep bed in low position, maintain call bell within reach at all times, provide assistance with transfer out of bed and ambulation.  

## 2015-07-31 ENCOUNTER — Other Ambulatory Visit: Payer: Self-pay | Admitting: Family Medicine

## 2015-07-31 DIAGNOSIS — N63 Unspecified lump in unspecified breast: Secondary | ICD-10-CM

## 2015-08-03 ENCOUNTER — Other Ambulatory Visit: Payer: Self-pay | Admitting: Pain Medicine

## 2015-08-06 ENCOUNTER — Encounter: Payer: Self-pay | Admitting: Pain Medicine

## 2015-08-06 ENCOUNTER — Ambulatory Visit: Payer: Medicare Other | Attending: Pain Medicine | Admitting: Pain Medicine

## 2015-08-06 VITALS — BP 130/82 | HR 78 | Temp 98.2°F | Resp 20 | Wt 195.0 lb

## 2015-08-06 DIAGNOSIS — M533 Sacrococcygeal disorders, not elsewhere classified: Secondary | ICD-10-CM

## 2015-08-06 DIAGNOSIS — M5010 Cervical disc disorder with radiculopathy, unspecified cervical region: Secondary | ICD-10-CM

## 2015-08-06 DIAGNOSIS — G90522 Complex regional pain syndrome I of left lower limb: Secondary | ICD-10-CM

## 2015-08-06 DIAGNOSIS — M47816 Spondylosis without myelopathy or radiculopathy, lumbar region: Secondary | ICD-10-CM

## 2015-08-06 DIAGNOSIS — M79601 Pain in right arm: Secondary | ICD-10-CM | POA: Diagnosis present

## 2015-08-06 DIAGNOSIS — M79602 Pain in left arm: Secondary | ICD-10-CM | POA: Diagnosis present

## 2015-08-06 DIAGNOSIS — M5137 Other intervertebral disc degeneration, lumbosacral region: Secondary | ICD-10-CM

## 2015-08-06 DIAGNOSIS — M5030 Other cervical disc degeneration, unspecified cervical region: Secondary | ICD-10-CM

## 2015-08-06 DIAGNOSIS — M47812 Spondylosis without myelopathy or radiculopathy, cervical region: Secondary | ICD-10-CM | POA: Insufficient documentation

## 2015-08-06 DIAGNOSIS — M5416 Radiculopathy, lumbar region: Secondary | ICD-10-CM

## 2015-08-06 DIAGNOSIS — M542 Cervicalgia: Secondary | ICD-10-CM | POA: Diagnosis present

## 2015-08-06 MED ORDER — FENTANYL CITRATE (PF) 100 MCG/2ML IJ SOLN
INTRAMUSCULAR | Status: AC
  Administered 2015-08-06: 100 ug via INTRAVENOUS
  Filled 2015-08-06: qty 2

## 2015-08-06 MED ORDER — SODIUM CHLORIDE 0.9 % IJ SOLN
INTRAMUSCULAR | Status: AC
  Filled 2015-08-06: qty 10

## 2015-08-06 MED ORDER — LIDOCAINE HCL (PF) 1 % IJ SOLN
INTRAMUSCULAR | Status: AC
  Administered 2015-08-06: 13:00:00
  Filled 2015-08-06: qty 5

## 2015-08-06 MED ORDER — MIDAZOLAM HCL 5 MG/5ML IJ SOLN
INTRAMUSCULAR | Status: AC
  Administered 2015-08-06: 5 mg via INTRAVENOUS
  Filled 2015-08-06: qty 5

## 2015-08-06 MED ORDER — LACTATED RINGERS IV SOLN: 1000.0000 mL | INTRAVENOUS | Status: DC

## 2015-08-06 MED ORDER — ORPHENADRINE CITRATE 30 MG/ML IJ SOLN
60.0000 mg | Freq: Once | INTRAMUSCULAR | Status: AC
  Administered 2015-08-06: 13:00:00 via INTRAMUSCULAR

## 2015-08-06 MED ORDER — BUPIVACAINE HCL (PF) 0.25 % IJ SOLN
30.0000 mL | Freq: Once | INTRAMUSCULAR | Status: AC
  Administered 2015-08-06: 13:00:00

## 2015-08-06 MED ORDER — SODIUM CHLORIDE 0.9 % IJ SOLN
20.0000 mL | Freq: Once | INTRAMUSCULAR | Status: AC
  Administered 2015-08-06: 13:00:00

## 2015-08-06 MED ORDER — CEFAZOLIN SODIUM 1-5 GM-% IV SOLN: 1.0000 g | Freq: Once | INTRAVENOUS | Status: DC

## 2015-08-06 MED ORDER — TRIAMCINOLONE ACETONIDE 40 MG/ML IJ SUSP
40.0000 mg | Freq: Once | INTRAMUSCULAR | Status: AC
  Administered 2015-08-06: 13:00:00

## 2015-08-06 MED ORDER — FENTANYL CITRATE (PF) 100 MCG/2ML IJ SOLN
100.0000 ug | Freq: Once | INTRAMUSCULAR | Status: AC
  Administered 2015-08-06: 100 ug via INTRAVENOUS

## 2015-08-06 MED ORDER — CEFAZOLIN SODIUM 1 G IJ SOLR
INTRAMUSCULAR | Status: AC
  Administered 2015-08-06: 13:00:00
  Filled 2015-08-06: qty 10

## 2015-08-06 MED ORDER — TRIAMCINOLONE ACETONIDE 40 MG/ML IJ SUSP
INTRAMUSCULAR | Status: AC
  Filled 2015-08-06: qty 1

## 2015-08-06 MED ORDER — CEFUROXIME AXETIL 250 MG PO TABS: 250.0000 mg | ORAL_TABLET | Freq: Two times a day (BID) | ORAL | Status: DC

## 2015-08-06 MED ORDER — ORPHENADRINE CITRATE 30 MG/ML IJ SOLN
INTRAMUSCULAR | Status: AC
  Filled 2015-08-06: qty 2

## 2015-08-06 MED ORDER — BUPIVACAINE HCL (PF) 0.25 % IJ SOLN
INTRAMUSCULAR | Status: AC
  Filled 2015-08-06: qty 30

## 2015-08-06 MED ORDER — MIDAZOLAM HCL 5 MG/5ML IJ SOLN
5.0000 mg | Freq: Once | INTRAMUSCULAR | Status: AC
  Administered 2015-08-06: 5 mg via INTRAVENOUS

## 2015-08-06 NOTE — Progress Notes (Signed)
   Subjective:    Patient ID: Jill Rivas, female    DOB: May 21, 1971, 44 y.o.   MRN: [id number]  HPI  PROCEDURE PERFORMED: Cervical epidural steroid injection   NOTE: 9 patient is a 44 year old female who returns to [facility] for further evaluation and prescription of pain involving the neck and upper extremity region. MRI revealed the patient to be with evidence of Degenerative disc disease cervical spine Degenerative changes cervical spine most notable at the C5-6 level. There is concern regarding patient being with cervical radiculopathy with severe pain of the cervical region and upper extremity regions. The risks, benefits, and expectations of the procedure have been discussed and explained to the patient who was understanding and wished to proceed with interventional treatment as planned. Will proceed with interventional treatment as discussed and as explained to the patient.  DESCRIPTION OF PROCEDURE: Cervical epidural steroid injection with IV Versed, IV fentanyl conscious sedation, EKG, blood pressure, pulse, and pulse oximetry monitoring. The procedure was performed with the patient in the prone position under fluoroscopic guidance. With AP view of the cervical spine, the patient in the prone position, Betadine prep of proposed entry site was performed. Following local anesthetic skin wheal of 1.5% plain lidocaine of proposed needle entry site, 18-gauge Tuohy epidural needle inserted via hanging drop technique at the C 6 vertebral body level with needle placed at the left of the midline under fluoroscopic guidance. Following needle placement under fluoroscopic guidance, a total of 4 mL of Preservative-Free normal saline with 40 mg of Kenalog injected incrementally via cervical epidural placed needle. Needle removed. The patient tolerated the injection well.   PLAN:  1. Medications: Will continue presently prescribed medications Topamax and oxycodone 2. Will consider  modification of treatment regimen at time of return appointment pending response to treatment rendered on today's visit.  3. The patient is to follow-up with primary care physician Dr. Lennox Grumbles  for evaluation of blood pressure and general medical condition status post steroid injection performed on today's visit.  4. Surgical evaluation. Patient to  undergo further surgical evaluation as discussed 5. Neurological evaluation. We'll consider PNCV EMG studies  6. May consider radiofrequency procedures, implantation device, and other treatment pending response to treatment and follow-up evaluation.  7. The patient has been advised to adhere to proper body mechanics and avoid activities which appear to aggravate condition.  8. The patient has been advised to call the Pain Management Center prior to scheduled return appointment should there be significant change in condition or should patient have other concerns regarding condition prior to scheduled return appointment.  The patient is understanding and in agreement with suggested treatment plan.      Review of Systems     Objective:   Physical Exam        Assessment & Plan:

## 2015-08-06 NOTE — Progress Notes (Signed)
Safety precautions to be maintained throughout the outpatient stay will include: orient to surroundings, keep bed in low position, maintain call bell within reach at all times, provide assistance with transfer out of bed and ambulation.  

## 2015-08-06 NOTE — Patient Instructions (Addendum)
PLAN   Continue present medication Topamax oxycodone. Please obtain Ceftin antibiotic today and begin taking Ceftin antibiotic today as prescribed  F/U PCP Dr. Lennox Grumbles evaliation of  BP and general medical  condition  F/U surgical evaluation as discussed. Follow up with neurosurgeon as discussed for further evaluation  F/U neurological evaluation. May consider pending follow-up evaluations  May consider radiofrequency rhizolysis or intraspinal procedures pending response to present treatment and F/U evaluation   Patient to call Pain Management Center should patient have concerns prior to scheduled return appointment.Epidural Steroid Injection An epidural steroid injection is given to relieve pain in your neck, back, or legs that is caused by the irritation or swelling of a nerve root. This procedure involves injecting a steroid and numbing medicine (anesthetic) into the epidural space. The epidural space is the space between the outer covering of your spinal cord and the bones that form your backbone (vertebra).  LET [facility] CARE PROVIDER KNOW ABOUT:   Any allergies you have.  All medicines you are taking, including vitamins, herbs, eye drops, creams, and over-the-counter medicines such as aspirin.  Previous problems you or members of your family have had with the use of anesthetics.  Any blood disorders or blood clotting disorders you have.  Previous surgeries you have had.  Medical conditions you have. RISKS AND COMPLICATIONS Generally, this is a safe procedure. However, as with any procedure, complications can occur. Possible complications of epidural steroid injection include:  Headache.  Bleeding.  Infection.  Allergic reaction to the medicines.  Damage to your nerves. The response to this procedure depends on the underlying cause of the pain and its duration. People who have long-term (chronic) pain are less likely to benefit from epidural steroids than are those  people whose pain comes on strong and suddenly. BEFORE THE PROCEDURE   Ask your health care provider about changing or stopping your regular medicines. You may be advised to stop taking blood-thinning medicines a few days before the procedure.  You may be given medicines to reduce anxiety.  Arrange for someone to take you home after the procedure. PROCEDURE   You will remain awake during the procedure. You may receive medicine to make you relaxed.  You will be asked to lie on your stomach.  The injection site will be cleaned.  The injection site will be numbed with a medicine (local anesthetic).  A needle will be injected through your skin into the epidural space.  Your health care provider will use an X-ray machine to ensure that the steroid is delivered closest to the affected nerve. You may have minimal discomfort at this time.  Once the needle is in the right position, the local anesthetic and the steroid will be injected into the epidural space.  The needle will then be removed and a bandage will be applied to the injection site. AFTER THE PROCEDURE   You may be monitored for a short time before you go home.  You may feel weakness or numbness in your arm or leg, which disappears within hours.  You may be allowed to eat, drink, and take your regular medicine.  You may have soreness at the site of the injection.   This information is not intended to replace advice given to you by your health care provider. Make sure you discuss any questions you have with your health care provider.   Document Released: 12/16/2007 Document Revised: 05/11/2013 Document Reviewed: 02/25/2013 Elsevier Interactive Patient Education 2016 [employer]. Pain Management Discharge Instructions  General Discharge Instructions :  If you need to reach your doctor call: Monday-Friday 8:00 am - 4:00 pm at [phone] or toll free [phone].  After clinic hours [phone] to have operator reach  doctor.  Bring all of your medication bottles to all your appointments in the pain clinic.  To cancel or reschedule your appointment with Pain Management please remember to call 24 hours in advance to avoid a fee.  Refer to the educational materials which you have been given on: General Risks, I had my Procedure. Discharge Instructions, Post Sedation.  Post Procedure Instructions:  The drugs you were given will stay in your system until tomorrow, so for the next 24 hours you should not drive, make any legal decisions or drink any alcoholic beverages.  You may eat anything you prefer, but it is better to start with liquids then soups and crackers, and gradually work up to solid foods.  Please notify your doctor immediately if you have any unusual bleeding, trouble breathing or pain that is not related to your normal pain.  Depending on the type of procedure that was done, some parts of your body may feel week and/or numb.  This usually clears up by tonight or the next day.  Walk with the use of an assistive device or accompanied by an adult for the 24 hours.  You may use ice on the affected area for the first 24 hours.  Put ice in a Ziploc bag and cover with a towel and place against area 15 minutes on 15 minutes off.  You may switch to heat after 24 hours.

## 2015-08-07 ENCOUNTER — Telehealth: Payer: Self-pay | Admitting: *Deleted

## 2015-08-07 NOTE — Telephone Encounter (Signed)
Left voicemail re; procedure on yesterday, to call our office with any questions or concerns.

## 2015-08-13 ENCOUNTER — Other Ambulatory Visit: Payer: Self-pay | Admitting: Pain Medicine

## 2015-08-21 ENCOUNTER — Ambulatory Visit
Admission: RE | Admit: 2015-08-21 | Discharge: 2015-08-21 | Disposition: A | Discharge status: Home / Self Care | Payer: Medicare Other | Source: Ambulatory Visit | Attending: Family Medicine | Admitting: Family Medicine

## 2015-08-21 ENCOUNTER — Other Ambulatory Visit: Payer: Self-pay | Admitting: Family Medicine

## 2015-08-21 DIAGNOSIS — Z9882 Breast implant status: Secondary | ICD-10-CM | POA: Diagnosis not present

## 2015-08-21 DIAGNOSIS — R928 Other abnormal and inconclusive findings on diagnostic imaging of breast: Secondary | ICD-10-CM | POA: Insufficient documentation

## 2015-08-21 DIAGNOSIS — N63 Unspecified lump in unspecified breast: Secondary | ICD-10-CM

## 2015-08-22 ENCOUNTER — Encounter: Payer: Self-pay | Admitting: Pain Medicine

## 2015-08-22 ENCOUNTER — Ambulatory Visit: Payer: Medicare Other | Attending: Pain Medicine | Admitting: Pain Medicine

## 2015-08-22 VITALS — BP 81/57 | HR 58 | Temp 97.8°F | Resp 16 | Ht 67.0 in | Wt 195.0 lb

## 2015-08-22 DIAGNOSIS — G90522 Complex regional pain syndrome I of left lower limb: Secondary | ICD-10-CM

## 2015-08-22 DIAGNOSIS — M47896 Other spondylosis, lumbar region: Secondary | ICD-10-CM | POA: Diagnosis not present

## 2015-08-22 DIAGNOSIS — M51379 Other intervertebral disc degeneration, lumbosacral region without mention of lumbar back pain or lower extremity pain: Secondary | ICD-10-CM

## 2015-08-22 DIAGNOSIS — M79602 Pain in left arm: Secondary | ICD-10-CM | POA: Diagnosis present

## 2015-08-22 DIAGNOSIS — M5137 Other intervertebral disc degeneration, lumbosacral region: Secondary | ICD-10-CM

## 2015-08-22 DIAGNOSIS — M79601 Pain in right arm: Secondary | ICD-10-CM | POA: Diagnosis present

## 2015-08-22 DIAGNOSIS — M50122 Cervical disc disorder at C5-C6 level with radiculopathy: Secondary | ICD-10-CM | POA: Diagnosis not present

## 2015-08-22 DIAGNOSIS — M5030 Other cervical disc degeneration, unspecified cervical region: Secondary | ICD-10-CM

## 2015-08-22 DIAGNOSIS — M47816 Spondylosis without myelopathy or radiculopathy, lumbar region: Secondary | ICD-10-CM

## 2015-08-22 DIAGNOSIS — M5416 Radiculopathy, lumbar region: Secondary | ICD-10-CM

## 2015-08-22 DIAGNOSIS — M5010 Cervical disc disorder with radiculopathy, unspecified cervical region: Secondary | ICD-10-CM

## 2015-08-22 DIAGNOSIS — M533 Sacrococcygeal disorders, not elsewhere classified: Secondary | ICD-10-CM

## 2015-08-22 DIAGNOSIS — M542 Cervicalgia: Secondary | ICD-10-CM | POA: Diagnosis present

## 2015-08-22 MED ORDER — OXYCODONE HCL 10 MG PO TABS: ORAL_TABLET | ORAL | Status: DC

## 2015-08-22 MED ORDER — TOPIRAMATE 50 MG PO TABS: ORAL_TABLET | ORAL | Status: DC

## 2015-08-22 NOTE — Progress Notes (Signed)
Subjective:    Patient ID: Jill Rivas, female    DOB: [birth year], 44 y.o.   MRN: [id number]  HPI  The patient is a 44 year old female who returns to pain management Center for further evaluation and treatment of pain involving the neck and upper extremity regions as well as the lower back lower extremity region. The patient is with prior cervical epidural steroid injection performed for pain involving the cervical region and upper extremity region associated with weakness especially the left upper extremity. We encouraged patient to follow up with neurosurgeon again for reassessment of her condition with consideration to be given to surgical intervention. The patient will proceed with surgical reevaluation as discussed. We discussed patient's lumbar lower extremity pain involving the left lower extremity. Patientwill return of sharp shooting burning stinging pain and increased sensitivity to touch of the left lower extremity. We will proceed with interventional treatment at time return appointment in attempt to decrease severity of patient's symptoms, minimize progression of patient's symptoms, and avoid the need for more involved treatment for treatment of the lower extremity pain felt to be with significant component of complex regional pain syndrome. Has been concern regarding both sympathetic mediated pain except that independent pain contribute to patient's symptomatology patient is with known arthritic changes and neuralgias of the lower extremity and is showing was significant benefit from lumbosacral selective nerve root block compared to the lumbar synthetic block. We will proceed with lumbosacral selective nerve root block at time return appointment in attempt to decrease severity of patient's symptoms, minimize progression of patient's symptoms, and over the need for more involved treatment. The patient agreed to suggested treatment plan.    Review of Systems     Objective:   Physical Exam  . There was tenderness to palpation over the splenius capitis and a separate talus musculature regions. Palpation of these regions reproduce moderate discomfort. There was limited range of motion of the cervical spine to of mild degree with rotation flexion extension. There was no definite radiation of pain to the upper extremity and no definite abnormalities noted with Spurling's maneuver. There appeared to be decreased grip strength on the left compared to the right. Tinel and Phalen's maneuver were without increased pain of significant degree. There was no evidence of crepitus of the thoracic region and palpation over the paraspinal must reason cervical and thoracic region reproduced moderate discomfort with moderate muscle spasms noted. Palpation over the lumbar paraspinal musculatures and lumbosacral region was attends to palpation of moderate degree. Left lower extremity was attends to palpation and was with a cyanotic you and increased sensitivity to touch with some areas suggestive of allodynia. EHL strength was decreased. There was negative clonus negative Homans. Abdomen nontender with no costovertebral tenderness noted. There was mild tenderness of the greater enteric region iliotibial band region.      Assessment & Plan:   Degenerative disc disease cervical spine Degenerative changes cervical spine most notable at the C5-6 level  Cervical radiculopathy  Complex regional pain syndrome of left lower extremity  Sacroiliac joint dysfunction    PLAN    Continue present medication Topamax and oxycodone   Lumbar selective nerve root block to be performed at time return appointment  F/U PCP Dr. Posey Pronto  for evaliation of low BP and general medical condition as discussed  F/U surgical evaluation as discussed. Consider neurosurgical reevaluation especially of left upper extremity weakness as we discussed  F/U neurological evaluation. May consider pending follow-up  evaluations  May consider  radiofrequency rhizolysis or intraspinal procedures pending response to present treatment and F/U evaluation   Patient to call Pain Management Center should patient have concerns prior to scheduled return appointment.

## 2015-08-22 NOTE — Progress Notes (Signed)
Safety precautions to be maintained throughout the outpatient stay will include: orient to surroundings, keep bed in low position, maintain call bell within reach at all times, provide assistance with transfer out of bed and ambulation.  

## 2015-08-22 NOTE — Patient Instructions (Addendum)
PLAN   Continue present medication Topamax and oxycodone   Lumbar selective nerve root block to be performed at time return appointment  F/U PCP Dr. Posey Pronto  for evaliation of low BP and general medical condition as discussed  F/U surgical evaluation as discussed. Consider neurosurgical reevaluation especially of left upper extremity weakness as we discussed  F/U neurological evaluation. May consider pending follow-up evaluations  May consider radiofrequency rhizolysis or intraspinal procedures pending response to present treatment and F/U evaluation   Patient to call Pain Management Center should patient have concerns prior to scheduled return appointment.

## 2015-09-10 ENCOUNTER — Ambulatory Visit: Payer: Medicare Other | Attending: Pain Medicine | Admitting: Pain Medicine

## 2015-09-10 ENCOUNTER — Encounter: Payer: Self-pay | Admitting: Pain Medicine

## 2015-09-10 VITALS — BP 103/62 | HR 72 | Temp 98.0°F | Resp 18 | Ht 67.0 in | Wt 195.0 lb

## 2015-09-10 DIAGNOSIS — M5010 Cervical disc disorder with radiculopathy, unspecified cervical region: Secondary | ICD-10-CM

## 2015-09-10 DIAGNOSIS — M51379 Other intervertebral disc degeneration, lumbosacral region without mention of lumbar back pain or lower extremity pain: Secondary | ICD-10-CM

## 2015-09-10 DIAGNOSIS — M5136 Other intervertebral disc degeneration, lumbar region: Secondary | ICD-10-CM | POA: Insufficient documentation

## 2015-09-10 DIAGNOSIS — M5137 Other intervertebral disc degeneration, lumbosacral region: Secondary | ICD-10-CM

## 2015-09-10 DIAGNOSIS — M5416 Radiculopathy, lumbar region: Secondary | ICD-10-CM

## 2015-09-10 DIAGNOSIS — M47816 Spondylosis without myelopathy or radiculopathy, lumbar region: Secondary | ICD-10-CM

## 2015-09-10 DIAGNOSIS — G90522 Complex regional pain syndrome I of left lower limb: Secondary | ICD-10-CM

## 2015-09-10 DIAGNOSIS — M533 Sacrococcygeal disorders, not elsewhere classified: Secondary | ICD-10-CM

## 2015-09-10 DIAGNOSIS — M545 Low back pain: Secondary | ICD-10-CM | POA: Insufficient documentation

## 2015-09-10 DIAGNOSIS — M5030 Other cervical disc degeneration, unspecified cervical region: Secondary | ICD-10-CM

## 2015-09-10 MED ORDER — LACTATED RINGERS IV SOLN: 1000.0000 mL | INTRAVENOUS | Status: DC

## 2015-09-10 MED ORDER — TRIAMCINOLONE ACETONIDE 40 MG/ML IJ SUSP
40.0000 mg | Freq: Once | INTRAMUSCULAR | Status: AC
  Administered 2015-09-10: 08:00:00

## 2015-09-10 MED ORDER — CEFAZOLIN SODIUM 1-5 GM-% IV SOLN: 1.0000 g | Freq: Once | INTRAVENOUS | Status: DC

## 2015-09-10 MED ORDER — LIDOCAINE HCL (PF) 1 % IJ SOLN: 10.0000 mL | Freq: Once | INTRAMUSCULAR | Status: DC

## 2015-09-10 MED ORDER — FENTANYL CITRATE (PF) 100 MCG/2ML IJ SOLN
100.0000 ug | Freq: Once | INTRAMUSCULAR | Status: AC
  Administered 2015-09-10: 100 ug via INTRAVENOUS

## 2015-09-10 MED ORDER — TRIAMCINOLONE ACETONIDE 40 MG/ML IJ SUSP
INTRAMUSCULAR | Status: AC
  Filled 2015-09-10: qty 1

## 2015-09-10 MED ORDER — BUPIVACAINE HCL (PF) 0.25 % IJ SOLN
INTRAMUSCULAR | Status: AC
  Filled 2015-09-10: qty 30

## 2015-09-10 MED ORDER — FENTANYL CITRATE (PF) 100 MCG/2ML IJ SOLN
INTRAMUSCULAR | Status: AC
  Administered 2015-09-10: 100 ug via INTRAVENOUS
  Filled 2015-09-10: qty 2

## 2015-09-10 MED ORDER — MIDAZOLAM HCL 5 MG/5ML IJ SOLN
INTRAMUSCULAR | Status: AC
  Administered 2015-09-10: 5 mg via INTRAVENOUS
  Filled 2015-09-10: qty 5

## 2015-09-10 MED ORDER — ORPHENADRINE CITRATE 30 MG/ML IJ SOLN
INTRAMUSCULAR | Status: AC
  Filled 2015-09-10: qty 2

## 2015-09-10 MED ORDER — ORPHENADRINE CITRATE 30 MG/ML IJ SOLN
60.0000 mg | Freq: Once | INTRAMUSCULAR | Status: AC
  Administered 2015-09-10: 08:00:00 via INTRAMUSCULAR

## 2015-09-10 MED ORDER — MIDAZOLAM HCL 5 MG/5ML IJ SOLN
5.0000 mg | Freq: Once | INTRAMUSCULAR | Status: AC
  Administered 2015-09-10: 5 mg via INTRAVENOUS

## 2015-09-10 MED ORDER — CEFUROXIME AXETIL 250 MG PO TABS: 250.0000 mg | ORAL_TABLET | Freq: Two times a day (BID) | ORAL | Status: DC

## 2015-09-10 MED ORDER — CEFAZOLIN SODIUM 1 G IJ SOLR
INTRAMUSCULAR | Status: AC
  Administered 2015-09-10: 100 mg
  Filled 2015-09-10: qty 10

## 2015-09-10 MED ORDER — BUPIVACAINE HCL (PF) 0.25 % IJ SOLN
30.0000 mL | Freq: Once | INTRAMUSCULAR | Status: AC
  Administered 2015-09-10: 08:00:00

## 2015-09-10 NOTE — Progress Notes (Signed)
Subjective:    Patient ID: Jill Rivas, female    DOB: 18-Feb-1971, 44 y.o.   MRN: [id number]  HPI  PROCEDURE PERFORMED: Lumbosacral selective nerve root block   NOTE: The patient is a 44 y.o. female who returns to Pain Management Center for further evaluation and treatment of pain involving the lumbar and lower extremity region. Studies consisting of MRI has revealed the patient to be with evidence of multilevel degenerative changes of the lumbar spine. The patient is with history of trauma to the left lower extremity and prior surgery of the left lower extremity with cold sensation of the left lower extremity and cyanotic hue of the left lower extremity. There has been concern regarding component of patient's pain being due to complex regional pain syndrome. The patient has had more significant relief of pain with somatic blocks consisting of selective nerve root block then lumbar sympathetic block. We will proceed with lumbosacral selective nerve root blocks in attempt to decrease severity of patient's symptoms and avoid progression of patient's symptoms and avoid the need for more involved treatment. The risks, benefits, and expectations of the procedure have been explained to the patient who was understanding and in agreement with suggested treatment plan. We will proceed with interventional treatment as discussed and as explained to the patient. The patient is understanding and in agreement with suggested treatment plan.   DESCRIPTION OF PROCEDURE: Lumbosacral selective nerve root block with IV Versed, IV fentanyl conscious sedation, EKG, blood pressure, pulse, and pulse oximetry monitoring. The procedure was performed with the patient in the prone position under fluoroscopic guidance. With the patient in the prone position, Betadine prep of proposed entry site was performed. Local anesthetic skin wheal of proposed needle entry site was prepared with 1.5% plain lidocaine with AP view of  the lumbosacral spine.   PROCEDURE #1: Needle placement at the left L 2 vertebral body: A 22 -gauge needle was inserted at the inferior border of the transverse process of the vertebral body with needle placed medial to the midline of the transverse process on AP view of the lumbosacral spine.   NEEDLE PLACEMENT AT  the L3, L4, and L5  VERTEBRAL BODY LEVELS  Needle  placement was accomplished at L3, L4, and L5  vertebral body levels on the left side exactly as was accomplished at the L2  vertebral body level  and utilizing the same technique and under fluoroscopic guidance.  PROCEDURE #4: Needle placement at the S1 foramen. With the patient in the prone position with Betadine prep of proposed entry site accomplished, the S1 foramen was visualized under fluoroscopic guidance with AP view of the lumbosacral spine with cephalad orientation of the fluoroscope with local anesthetic skin wheal of 1.5% lidocaine of proposed needle entry site prepared. A 22-gauge needle was inserted S1 foramen under fluoroscopic guidance eliciting paresthesias radiating from the buttocks to the lower extremity after which needle was slightly withdrawn.   Needle placement was then verified on lateral view at all levels with needle tip documented to be in the posterior superior quadrant of the intervertebral foramen of  L 2, L3, L4, and L5. Following negative aspiration for heme and CSF at each level, each level was injected with 3 mL of 0.25% bupivacaine with Kenalog.      The patient tolerated the procedure well. A total of 10 mg of Kenalog was utilized for the procedure.   PLAN:  1. Medications: Will continue presently prescribed medications Topamax and oxycodone. 2. The patient  is to undergo follow-up evaluation with PCP Dr. Lennox Grumbles for evaluation of blood pressure and general medical condition status post procedure performed on today's visit. 3. Surgical follow-up evaluation. 4. Neurological evaluation. 5. May  consider radiofrequency procedures, implantation type procedures and other treatment pending response to treatment and follow-up evaluation. 6. The patient has been advise do adhere to proper body mechanics and avoid activities which may aggravate condition. 7. The patient has been advised to call the Pain Management Center prior to scheduled return appointment should there be significant change in the patient's condition or should the patient have other concerns regarding condition prior to scheduled return appointment.   Review of Systems     Objective:   Physical Exam        Assessment & Plan:

## 2015-09-10 NOTE — Patient Instructions (Addendum)
PLAN   Continue present medication Topamax and oxycodone. Please obtain Ceftin antibiotic today and begin taking Ceftin antibiotic today as prescribed  F/U PCP Dr. Lennox Grumbles evaliation of  BP and general medical  condition.. Please see Dr. Lennox Grumbles today or by Tuesday, December 20, for evaluation of blood pressure and general medical condition as discussed  F/U surgical evaluation as discussed. Follow up with neurosurgeon as discussed for further evaluation  F/U neurological evaluation. May consider pending follow-up evaluations  May consider radiofrequency rhizolysis or intraspinal procedures pending response to present treatment and F/U evaluation   Patient to call Pain Management Center should patient have concerns prior to scheduled return appointment.Pain Management Discharge Instructions  General Discharge Instructions :  If you need to reach your doctor call: Monday-Friday 8:00 am - 4:00 pm at [phone] or toll free [phone].  After clinic hours [phone] to have operator reach doctor.  Bring all of your medication bottles to all your appointments in the pain clinic.  To cancel or reschedule your appointment with Pain Management please remember to call 24 hours in advance to avoid a fee.  Refer to the educational materials which you have been given on: General Risks, I had my Procedure. Discharge Instructions, Post Sedation.  Post Procedure Instructions:  The drugs you were given will stay in your system until tomorrow, so for the next 24 hours you should not drive, make any legal decisions or drink any alcoholic beverages.  You may eat anything you prefer, but it is better to start with liquids then soups and crackers, and gradually work up to solid foods.  Please notify your doctor immediately if you have any unusual bleeding, trouble breathing or pain that is not related to your normal pain.  Depending on the type of procedure that was done, some parts of your body may  feel week and/or numb.  This usually clears up by tonight or the next day.  Walk with the use of an assistive device or accompanied by an adult for the 24 hours.  You may use ice on the affected area for the first 24 hours.  Put ice in a Ziploc bag and cover with a towel and place against area 15 minutes on 15 minutes off.  You may switch to heat after 24 hours.GENERAL RISKS AND COMPLICATIONS  What are the risk, side effects and possible complications? Generally speaking, most procedures are safe.  However, with any procedure there are risks, side effects, and the possibility of complications.  The risks and complications are dependent upon the sites that are lesioned, or the type of nerve block to be performed.  The closer the procedure is to the spine, the more serious the risks are.  Great care is taken when placing the radio frequency needles, block needles or lesioning probes, but sometimes complications can occur. 1. Infection: Any time there is an injection through the skin, there is a risk of infection.  This is why sterile conditions are used for these blocks.  There are four possible types of infection. 1. Localized skin infection. 2. Central Nervous System Infection-This can be in the form of Meningitis, which can be deadly. 3. Epidural Infections-This can be in the form of an epidural abscess, which can cause pressure inside of the spine, causing compression of the spinal cord with subsequent paralysis. This would require an emergency surgery to decompress, and there are no guarantees that the patient would recover from the paralysis. 4. Discitis-This is an infection of the intervertebral discs.  It occurs in about 1% of discography procedures.  It is difficult to treat and it may lead to surgery.        2. Pain: the needles have to go through skin and soft tissues, will cause soreness.       3. Damage to internal structures:  The nerves to be lesioned may be near blood vessels or     other nerves which can be potentially damaged.       4. Bleeding: Bleeding is more common if the patient is taking blood thinners such as  aspirin, Coumadin, Ticiid, Plavix, etc., or if he/she have some genetic predisposition  such as hemophilia. Bleeding into the spinal canal can cause compression of the spinal  cord with subsequent paralysis.  This would require an emergency surgery to  decompress and there are no guarantees that the patient would recover from the  paralysis.       5. Pneumothorax:  Puncturing of a lung is a possibility, every time a needle is introduced in  the area of the chest or upper back.  Pneumothorax refers to free air around the  collapsed lung(s), inside of the thoracic cavity (chest cavity).  Another two possible  complications related to a similar event would include: Hemothorax and Chylothorax.   These are variations of the Pneumothorax, where instead of air around the collapsed  lung(s), you may have blood or chyle, respectively.       6. Spinal headaches: They may occur with any procedures in the area of the spine.       7. Persistent CSF (Cerebro-Spinal Fluid) leakage: This is a rare problem, but may occur  with prolonged intrathecal or epidural catheters either due to the formation of a fistulous  track or a dural tear.       8. Nerve damage: By working so close to the spinal cord, there is always a possibility of  nerve damage, which could be as serious as a permanent spinal cord injury with  paralysis.       9. Death:  Although rare, severe deadly allergic reactions known as "Anaphylactic  reaction" can occur to any of the medications used.      10. Worsening of the symptoms:  We can always make thing worse.  What are the chances of something like this happening? Chances of any of this occuring are extremely low.  By statistics, you have more of a chance of getting killed in a motor vehicle accident: while driving to the hospital than any of the above occurring .   Nevertheless, you should be aware that they are possibilities.  In general, it is similar to taking a shower.  Everybody knows that you can slip, hit your head and get killed.  Does that mean that you should not shower again?  Nevertheless always keep in mind that statistics do not mean anything if you happen to be on the wrong side of them.  Even if a procedure has a 1 (one) in a 1,000,000 (million) chance of going wrong, it you happen to be that one..Also, keep in mind that by statistics, you have more of a chance of having something go wrong when taking medications.  Who should not have this procedure? If you are on a blood thinning medication (e.g. Coumadin, Plavix, see list of "Blood Thinners"), or if you have an active infection going on, you should not have the procedure.  If you are taking any blood thinners, please inform your physician.  How should I prepare for this procedure?  Do not eat or drink anything at least six hours prior to the procedure.  Bring a driver with you .  It cannot be a taxi.  Come accompanied by an adult that can drive you back, and that is strong enough to help you if your legs get weak or numb from the local anesthetic.  Take all of your medicines the morning of the procedure with just enough water to swallow them.  If you have diabetes, make sure that you are scheduled to have your procedure done first thing in the morning, whenever possible.  If you have diabetes, take only half of your insulin dose and notify our nurse that you have done so as soon as you arrive at the clinic.  If you are diabetic, but only take blood sugar pills (oral hypoglycemic), then do not take them on the morning of your procedure.  You may take them after you have had the procedure.  Do not take aspirin or any aspirin-containing medications, at least eleven (11) days prior to the procedure.  They may prolong bleeding.  Wear loose fitting clothing that may be easy to take off and  that you would not mind if it got stained with Betadine or blood.  Do not wear any jewelry or perfume  Remove any nail coloring.  It will interfere with some of our monitoring equipment.  NOTE: Remember that this is not meant to be interpreted as a complete list of all possible complications.  Unforeseen problems may occur.  BLOOD THINNERS The following drugs contain aspirin or other products, which can cause increased bleeding during surgery and should not be taken for 2 weeks prior to and 1 week after surgery.  If you should need take something for relief of minor pain, you may take acetaminophen which is found in Tylenol,m Datril, Anacin-3 and Panadol. It is not blood thinner. The products listed below are.  Do not take any of the products listed below in addition to any listed on your instruction sheet.  A.P.C or A.P.C with Codeine Codeine Phosphate Capsules #3 Ibuprofen Ridaura  ABC compound Congesprin Imuran rimadil  Advil Cope Indocin Robaxisal  Alka-Seltzer Effervescent Pain Reliever and Antacid Coricidin or Coricidin-D  Indomethacin Rufen  Alka-Seltzer plus Cold Medicine Cosprin Ketoprofen S-A-C Tablets  Anacin Analgesic Tablets or Capsules Coumadin Korlgesic Salflex  Anacin Extra Strength Analgesic tablets or capsules CP-2 Tablets Lanoril Salicylate  Anaprox Cuprimine Capsules Levenox Salocol  Anexsia-D Dalteparin Magan Salsalate  Anodynos Darvon compound Magnesium Salicylate Sine-off  Ansaid Dasin Capsules Magsal Sodium Salicylate  Anturane Depen Capsules Marnal Soma  APF Arthritis pain formula Dewitt's Pills Measurin Stanback  Argesic Dia-Gesic Meclofenamic Sulfinpyrazone  Arthritis Bayer Timed Release Aspirin Diclofenac Meclomen Sulindac  Arthritis pain formula Anacin Dicumarol Medipren Supac  Analgesic (Safety coated) Arthralgen Diffunasal Mefanamic Suprofen  Arthritis Strength Bufferin Dihydrocodeine Mepro Compound Suprol  Arthropan liquid Dopirydamole Methcarbomol with  Aspirin Synalgos  ASA tablets/Enseals Disalcid Micrainin Tagament  Ascriptin Doan's Midol Talwin  Ascriptin A/D Dolene Mobidin Tanderil  Ascriptin Extra Strength Dolobid Moblgesic Ticlid  Ascriptin with Codeine Doloprin or Doloprin with Codeine Momentum Tolectin  Asperbuf Duoprin Mono-gesic Trendar  Aspergum Duradyne Motrin or Motrin IB Triminicin  Aspirin plain, buffered or enteric coated Durasal Myochrisine Trigesic  Aspirin Suppositories Easprin Nalfon Trillsate  Aspirin with Codeine Ecotrin Regular or Extra Strength Naprosyn Uracel  Atromid-S Efficin Naproxen Ursinus  Auranofin Capsules Elmiron Neocylate Vanquish  Axotal Emagrin Norgesic Verin  Azathioprine  Empirin or Empirin with Codeine Normiflo Vitamin E  Azolid Emprazil Nuprin Voltaren  Bayer Aspirin plain, buffered or children's or timed BC Tablets or powders Encaprin Orgaran Warfarin Sodium  Buff-a-Comp Enoxaparin Orudis Zorpin  Buff-a-Comp with Codeine Equegesic Os-Cal-Gesic   Buffaprin Excedrin plain, buffered or Extra Strength Oxalid   Bufferin Arthritis Strength Feldene Oxphenbutazone   Bufferin plain or Extra Strength Feldene Capsules Oxycodone with Aspirin   Bufferin with Codeine Fenoprofen Fenoprofen Pabalate or Pabalate-SF   Buffets II Flogesic Panagesic   Buffinol plain or Extra Strength Florinal or Florinal with Codeine Panwarfarin   Buf-Tabs Flurbiprofen Penicillamine   Butalbital Compound Four-way cold tablets Penicillin   Butazolidin Fragmin Pepto-Bismol   Carbenicillin Geminisyn Percodan   Carna Arthritis Reliever Geopen Persantine   Carprofen Gold's salt Persistin   Chloramphenicol Goody's Phenylbutazone   Chloromycetin Haltrain Piroxlcam   Clmetidine heparin Plaquenil   Cllnoril Hyco-pap Ponstel   Clofibrate Hydroxy chloroquine Propoxyphen         Before stopping any of these medications, be sure to consult the physician who ordered them.  Some, such as Coumadin (Warfarin) are ordered to prevent or  treat serious conditions such as "deep thrombosis", "pumonary embolisms", and other heart problems.  The amount of time that you may need off of the medication may also vary with the medication and the reason for which you were taking it.  If you are taking any of these medications, please make sure you notify your pain physician before you undergo any procedures.

## 2015-09-10 NOTE — Progress Notes (Signed)
Safety precautions to be maintained throughout the outpatient stay will include: orient to surroundings, keep bed in low position, maintain call bell within reach at all times, provide assistance with transfer out of bed and ambulation.  

## 2015-09-11 ENCOUNTER — Telehealth: Payer: Self-pay

## 2015-09-11 NOTE — Telephone Encounter (Signed)
Left voice mail

## 2015-09-23 DIAGNOSIS — G4730 Sleep apnea, unspecified: Secondary | ICD-10-CM

## 2015-09-23 HISTORY — DX: Sleep apnea, unspecified: G47.30

## 2015-09-25 ENCOUNTER — Encounter: Payer: Medicare Other | Admitting: Pain Medicine

## 2015-10-11 ENCOUNTER — Encounter: Payer: Self-pay | Admitting: Pain Medicine

## 2015-10-11 ENCOUNTER — Ambulatory Visit: Payer: Medicare Other | Attending: Pain Medicine | Admitting: Pain Medicine

## 2015-10-11 VITALS — BP 94/66 | HR 69 | Temp 98.2°F | Resp 16 | Ht 67.0 in | Wt 195.0 lb

## 2015-10-11 DIAGNOSIS — M533 Sacrococcygeal disorders, not elsewhere classified: Secondary | ICD-10-CM | POA: Diagnosis not present

## 2015-10-11 DIAGNOSIS — M542 Cervicalgia: Secondary | ICD-10-CM | POA: Insufficient documentation

## 2015-10-11 DIAGNOSIS — M50122 Cervical disc disorder at C5-C6 level with radiculopathy: Secondary | ICD-10-CM | POA: Insufficient documentation

## 2015-10-11 DIAGNOSIS — M47816 Spondylosis without myelopathy or radiculopathy, lumbar region: Secondary | ICD-10-CM

## 2015-10-11 DIAGNOSIS — M5010 Cervical disc disorder with radiculopathy, unspecified cervical region: Secondary | ICD-10-CM

## 2015-10-11 DIAGNOSIS — M5030 Other cervical disc degeneration, unspecified cervical region: Secondary | ICD-10-CM

## 2015-10-11 DIAGNOSIS — M546 Pain in thoracic spine: Secondary | ICD-10-CM | POA: Diagnosis present

## 2015-10-11 DIAGNOSIS — M5137 Other intervertebral disc degeneration, lumbosacral region: Secondary | ICD-10-CM

## 2015-10-11 DIAGNOSIS — M5416 Radiculopathy, lumbar region: Secondary | ICD-10-CM

## 2015-10-11 DIAGNOSIS — G90522 Complex regional pain syndrome I of left lower limb: Secondary | ICD-10-CM

## 2015-10-11 MED ORDER — OXYCODONE HCL 10 MG PO TABS: ORAL_TABLET | ORAL | Status: DC

## 2015-10-11 NOTE — Patient Instructions (Addendum)
PLAN   Continue present medication Topamax and oxycodone   Lumbar selective nerve root block to be performed at time return appointment  F/U PCP Dr. Posey Pronto  for evaliation of low BP and general medical condition as discussed. Please discuss blood pressure and general medical condition with Dr. Posey Pronto as we discussed today  F/U surgical evaluation as discussed. Consider neurosurgical reevaluation especially of left upper extremity weakness as we discussed. Speak with surgeon regarding your condition and exercises that you may consider performing without injury to your neck or upper extremity region as well as lower back and lower extremity region  F/U neurological evaluation. May consider pending follow-up evaluations  May consider radiofrequency rhizolysis or intraspinal procedures pending response to present treatment and F/U evaluation   Patient to call [facility] should patient have concerns prior to scheduled return appointment.GENERAL RISKS AND COMPLICATIONS  What are the risk, side effects and possible complications? Generally speaking, most procedures are safe.  However, with any procedure there are risks, side effects, and the possibility of complications.  The risks and complications are dependent upon the sites that are lesioned, or the type of nerve block to be performed.  The closer the procedure is to the spine, the more serious the risks are.  Great care is taken when placing the radio frequency needles, block needles or lesioning probes, but sometimes complications can occur. 1. Infection: Any time there is an injection through the skin, there is a risk of infection.  This is why sterile conditions are used for these blocks.  There are four possible types of infection. 1. Localized skin infection. 2. Central Nervous System Infection-This can be in the form of Meningitis, which can be deadly. 3. Epidural Infections-This can be in the form of an epidural abscess, which  can cause pressure inside of the spine, causing compression of the spinal cord with subsequent paralysis. This would require an emergency surgery to decompress, and there are no guarantees that the patient would recover from the paralysis. 4. Discitis-This is an infection of the intervertebral discs.  It occurs in about 1% of discography procedures.  It is difficult to treat and it may lead to surgery.        2. Pain: the needles have to go through skin and soft tissues, will cause soreness.       3. Damage to internal structures:  The nerves to be lesioned may be near blood vessels or    other nerves which can be potentially damaged.       4. Bleeding: Bleeding is more common if the patient is taking blood thinners such as  aspirin, Coumadin, Ticiid, Plavix, etc., or if he/she have some genetic predisposition  such as hemophilia. Bleeding into the spinal canal can cause compression of the spinal  cord with subsequent paralysis.  This would require an emergency surgery to  decompress and there are no guarantees that the patient would recover from the  paralysis.       5. Pneumothorax:  Puncturing of a lung is a possibility, every time a needle is introduced in  the area of the chest or upper back.  Pneumothorax refers to free air around the  collapsed lung(s), inside of the thoracic cavity (chest cavity).  Another two possible  complications related to a similar event would include: Hemothorax and Chylothorax.   These are variations of the Pneumothorax, where instead of air around the collapsed  lung(s), you may have blood or chyle, respectively.  6. Spinal headaches: They may occur with any procedures in the area of the spine.       7. Persistent CSF (Cerebro-Spinal Fluid) leakage: This is a rare problem, but may occur  with prolonged intrathecal or epidural catheters either due to the formation of a fistulous  track or a dural tear.       8. Nerve damage: By working so close to the spinal cord,  there is always a possibility of  nerve damage, which could be as serious as a permanent spinal cord injury with  paralysis.       9. Death:  Although rare, severe deadly allergic reactions known as "Anaphylactic  reaction" can occur to any of the medications used.      10. Worsening of the symptoms:  We can always make thing worse.  What are the chances of something like this happening? Chances of any of this occuring are extremely low.  By statistics, you have more of a chance of getting killed in a motor vehicle accident: while driving to the hospital than any of the above occurring .  Nevertheless, you should be aware that they are possibilities.  In general, it is similar to taking a shower.  Everybody knows that you can slip, hit your head and get killed.  Does that mean that you should not shower again?  Nevertheless always keep in mind that statistics do not mean anything if you happen to be on the wrong side of them.  Even if a procedure has a 1 (one) in a 1,000,000 (million) chance of going wrong, it you happen to be that one..Also, keep in mind that by statistics, you have more of a chance of having something go wrong when taking medications.  Who should not have this procedure? If you are on a blood thinning medication (e.g. Coumadin, Plavix, see list of "Blood Thinners"), or if you have an active infection going on, you should not have the procedure.  If you are taking any blood thinners, please inform your physician.  How should I prepare for this procedure?  Do not eat or drink anything at least six hours prior to the procedure.  Bring a driver with you .  It cannot be a taxi.  Come accompanied by an adult that can drive you back, and that is strong enough to help you if your legs get weak or numb from the local anesthetic.  Take all of your medicines the morning of the procedure with just enough water to swallow them.  If you have diabetes, make sure that you are scheduled to have  your procedure done first thing in the morning, whenever possible.  If you have diabetes, take only half of your insulin dose and notify our nurse that you have done so as soon as you arrive at the clinic.  If you are diabetic, but only take blood sugar pills (oral hypoglycemic), then do not take them on the morning of your procedure.  You may take them after you have had the procedure.  Do not take aspirin or any aspirin-containing medications, at least eleven (11) days prior to the procedure.  They may prolong bleeding.  Wear loose fitting clothing that may be easy to take off and that you would not mind if it got stained with Betadine or blood.  Do not wear any jewelry or perfume  Remove any nail coloring.  It will interfere with some of our monitoring equipment.  NOTE: Remember that this is  not meant to be interpreted as a complete list of all possible complications.  Unforeseen problems may occur.  BLOOD THINNERS The following drugs contain aspirin or other products, which can cause increased bleeding during surgery and should not be taken for 2 weeks prior to and 1 week after surgery.  If you should need take something for relief of minor pain, you may take acetaminophen which is found in Tylenol,m Datril, Anacin-3 and Panadol. It is not blood thinner. The products listed below are.  Do not take any of the products listed below in addition to any listed on your instruction sheet.  A.P.C or A.P.C with Codeine Codeine Phosphate Capsules #3 Ibuprofen Ridaura  ABC compound Congesprin Imuran rimadil  Advil Cope Indocin Robaxisal  Alka-Seltzer Effervescent Pain Reliever and Antacid Coricidin or Coricidin-D  Indomethacin Rufen  Alka-Seltzer plus Cold Medicine Cosprin Ketoprofen S-A-C Tablets  Anacin Analgesic Tablets or Capsules Coumadin Korlgesic Salflex  Anacin Extra Strength Analgesic tablets or capsules CP-2 Tablets Lanoril Salicylate  Anaprox Cuprimine Capsules Levenox Salocol   Anexsia-D Dalteparin Magan Salsalate  Anodynos Darvon compound Magnesium Salicylate Sine-off  Ansaid Dasin Capsules Magsal Sodium Salicylate  Anturane Depen Capsules Marnal Soma  APF Arthritis pain formula Dewitt's Pills Measurin Stanback  Argesic Dia-Gesic Meclofenamic Sulfinpyrazone  Arthritis Bayer Timed Release Aspirin Diclofenac Meclomen Sulindac  Arthritis pain formula Anacin Dicumarol Medipren Supac  Analgesic (Safety coated) Arthralgen Diffunasal Mefanamic Suprofen  Arthritis Strength Bufferin Dihydrocodeine Mepro Compound Suprol  Arthropan liquid Dopirydamole Methcarbomol with Aspirin Synalgos  ASA tablets/Enseals Disalcid Micrainin Tagament  Ascriptin Doan's Midol Talwin  Ascriptin A/D Dolene Mobidin Tanderil  Ascriptin Extra Strength Dolobid Moblgesic Ticlid  Ascriptin with Codeine Doloprin or Doloprin with Codeine Momentum Tolectin  Asperbuf Duoprin Mono-gesic Trendar  Aspergum Duradyne Motrin or Motrin IB Triminicin  Aspirin plain, buffered or enteric coated Durasal Myochrisine Trigesic  Aspirin Suppositories Easprin Nalfon Trillsate  Aspirin with Codeine Ecotrin Regular or Extra Strength Naprosyn Uracel  Atromid-S Efficin Naproxen Ursinus  Auranofin Capsules Elmiron Neocylate Vanquish  Axotal Emagrin Norgesic Verin  Azathioprine Empirin or Empirin with Codeine Normiflo Vitamin E  Azolid Emprazil Nuprin Voltaren  Bayer Aspirin plain, buffered or children's or timed BC Tablets or powders Encaprin Orgaran Warfarin Sodium  Buff-a-Comp Enoxaparin Orudis Zorpin  Buff-a-Comp with Codeine Equegesic Os-Cal-Gesic   Buffaprin Excedrin plain, buffered or Extra Strength Oxalid   Bufferin Arthritis Strength Feldene Oxphenbutazone   Bufferin plain or Extra Strength Feldene Capsules Oxycodone with Aspirin   Bufferin with Codeine Fenoprofen Fenoprofen Pabalate or Pabalate-SF   Buffets II Flogesic Panagesic   Buffinol plain or Extra Strength Florinal or Florinal with Codeine  Panwarfarin   Buf-Tabs Flurbiprofen Penicillamine   Butalbital Compound Four-way cold tablets Penicillin   Butazolidin Fragmin Pepto-Bismol   Carbenicillin Geminisyn Percodan   Carna Arthritis Reliever Geopen Persantine   Carprofen Gold's salt Persistin   Chloramphenicol Goody's Phenylbutazone   Chloromycetin Haltrain Piroxlcam   Clmetidine heparin Plaquenil   Cllnoril Hyco-pap Ponstel   Clofibrate Hydroxy chloroquine Propoxyphen         Before stopping any of these medications, be sure to consult the physician who ordered them.  Some, such as Coumadin (Warfarin) are ordered to prevent or treat serious conditions such as "deep thrombosis", "pumonary embolisms", and other heart problems.  The amount of time that you may need off of the medication may also vary with the medication and the reason for which you were taking it.  If you are taking any of these medications, please make sure  you notify your pain physician before you undergo any procedures.         Selective Nerve Root Block Patient Information  Description: Specific nerve roots exit the spinal canal and these nerves can be compressed and inflamed by a bulging disc and bone spurs.  By injecting steroids on the nerve root, we can potentially decrease the inflammation surrounding these nerves, which often leads to decreased pain.  Also, by injecting local anesthesia on the nerve root, this can provide [place] helpful information to give to your referring doctor if it decreases your pain.  Selective nerve root blocks can be done along the spine from the neck to the low back depending on the location of your pain.   After numbing the skin with local anesthesia, a small needle is passed to the nerve root and the position of the needle is verified using x-ray pictures.  After the needle is in correct position, we then deposit the medication.  You may experience a pressure sensation while this is being done.  The entire block usually lasts  less than 15 minutes.  Conditions that may be treated with selective nerve root blocks:  Low back and leg pain  Spinal stenosis  Diagnostic block prior to potential surgery  Neck and arm pain  Post laminectomy syndrome  Preparation for the injection:  1. Do not eat any solid food or dairy products within 6 hours of your appointment. 2. You may drink clear liquids up to 2 hours before an appointment.  Clear liquids include water, black coffee, juice or soda.  No milk or cream please. 3. You may take your regular medications, including pain medications, with a sip of water before your appointment.  Diabetics should hold regular insulin (if taken separately) and take 1/2 normal NPH dose the morning of the procedure.  Carry some sugar containing items with you to your appointment. 4. A driver must accompany you and be prepared to drive you home after your procedure. 5. Bring all your current medications with you. 6. An IV may be inserted and sedation may be given at the discretion of the physician. 7. A blood pressure cuff, EKG, and other monitors will often be applied during the procedure.  Some patients may need to have extra oxygen administered for a short period. 8. You will be asked to provide medical information, including allergies, prior to the procedure.  We must know immediately if you are taking blood  Thinners (like Coumadin) or if you are allergic to IV iodine contrast (dye).  Possible side-effects: All are usually temporary  Bleeding from needle site  Light headedness  Numbness and tingling  Decreased blood pressure  Weakness in arms/legs  Pressure sensation in back/neck  Pain at injection site (several days)  Possible complications: All are extremely rare  Infection  Nerve injury  Spinal headache (a headache wore with upright position)  Call if you experience:  Fever/chills associated with headache or increased back/neck pain  Headache worsened by  an upright position  New onset weakness or numbness of an extremity below the injection site  Hives or difficulty breathing (go to the emergency room)  Inflammation or drainage at the injection site(s)  Severe back/neck pain greater than usual  New symptoms which are concerning to you  Please note:  Although the local anesthetic injected can often make your back or neck feel good for several hours after the injection the pain will likely return.  It takes 3-5 days for steroids to work  on the nerve root. You may not notice any pain relief for at least one week.  If effective, we will often do a series of 3 injections spaced 3-6 weeks apart to maximally decrease your pain.    If you have any questions, please call [phone] [facility]

## 2015-10-11 NOTE — Progress Notes (Signed)
Subjective:    Patient ID: Jill Rivas, female    DOB: 09-26-1970, 45 y.o.   MRN: [id number]  HPI Patient is a 45 year old female who returns to pain management center for further evaluation and treatment of pain involving the neck entire back upper and lower extremity region. The patient has had improvement of pain involving the neck and upper extremity region and continues to be with some weakness of the left upper extremity. We have emphasized the patient once again the need to follow up with neurosurgeon to discuss patient's condition and to discuss plan of treatment in attempt to avoid weakness and other permanent sequela involving the neck and upper extremity regions. The patient was with understanding and will follow up with neurosurgeon as discussed. We also caution patient to avoid certain exercises until she spoke with her neurosurgeon. We have asked patient to obtain instructions regarding exercises from her neurosurgeon and to proceed with exercises if suggested by the neurosurgeon. The patient has had some exacerbation of lower extremity pain involving the left lower extremity which has been will component of complex regional pain syndrome. The patient notes some increased intensity of the lower extremity at this time. We will proceed with interventional treatment for the lower extremity pain felt to be due to complex regional pain syndrome and will want proceed at time return appointment. We will consider interventional treatment in pain involving the cervical and upper extremity regions pending follow-up evaluation in pending neurosurgical reevaluation as discussed and as explained to patient on today's visit. The patient was with understanding and agreement suggested plan. The patient will continue Topamax and oxycodone as prescribed this time.   Review of Systems     Objective:   Physical Exam There was tenderness to palpation of paraspinal muscular treat the cervical region  cervical facet region of mild to moderate degree. There was tenderness over the splenius capitis and occipitalis region a mild degree. There was mild tenderness of the acromial clavicular and glenohumeral joint region. There was decreased grip strength on the left compared to the right. The patient was with what appeared to be unremarkable S curvature region of the thoracic region with no crepitus of the thoracic region noted. Tinel and Phalen's maneuver were without increased pain of significant degree and grip strength was decreased on the left compared to the right as previously mentioned. purling's maneuver. Palpation over the lumbar paraspinal musculature region lumbar facet region was attends to palpation with lateral bending rotation tension over the lumbar facets reproducing mild/moderate discomfort. There was tenderness of the gluteal and piriformis musculature region as well as the PSIS and PII S region a mild to moderate degree. There was moderate to moderately severe tenderness to palpation of the left lower extremity especially distal to the knee with areas of allodynia noted. EHL strength was decreased there was negative Homans. 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 Posey Pronto  for evaliation of low BP and general medical condition as discussed. Please discuss blood pressure and general medical condition with Dr. Posey Pronto as we discussed today  F/U  surgical evaluation as discussed. Consider neurosurgical reevaluation especially of left upper extremity weakness as we  discussed. Speak with surgeon regarding your condition and exercises that you may consider performing without injury to your neck or upper extremity region as well as lower back and lower extremity region  F/U neurological evaluation. May consider pending follow-up evaluations  May consider radiofrequency rhizolysis or intraspinal procedures pending response to present treatment and F/U evaluation   Patient to call Pain Management Center should patient have concerns prior to scheduled return appointment

## 2015-11-07 ENCOUNTER — Ambulatory Visit: Payer: Medicare Other | Attending: Pain Medicine | Admitting: Pain Medicine

## 2015-11-07 ENCOUNTER — Encounter: Payer: Self-pay | Admitting: Pain Medicine

## 2015-11-07 VITALS — BP 103/68 | HR 54 | Temp 98.3°F | Resp 16 | Ht 67.0 in | Wt 195.0 lb

## 2015-11-07 DIAGNOSIS — M545 Low back pain: Secondary | ICD-10-CM | POA: Insufficient documentation

## 2015-11-07 DIAGNOSIS — Z9889 Other specified postprocedural states: Secondary | ICD-10-CM | POA: Insufficient documentation

## 2015-11-07 DIAGNOSIS — M5137 Other intervertebral disc degeneration, lumbosacral region: Secondary | ICD-10-CM

## 2015-11-07 DIAGNOSIS — M5416 Radiculopathy, lumbar region: Secondary | ICD-10-CM

## 2015-11-07 DIAGNOSIS — M5010 Cervical disc disorder with radiculopathy, unspecified cervical region: Secondary | ICD-10-CM

## 2015-11-07 DIAGNOSIS — G90522 Complex regional pain syndrome I of left lower limb: Secondary | ICD-10-CM

## 2015-11-07 DIAGNOSIS — M533 Sacrococcygeal disorders, not elsewhere classified: Secondary | ICD-10-CM

## 2015-11-07 DIAGNOSIS — M79605 Pain in left leg: Secondary | ICD-10-CM | POA: Diagnosis not present

## 2015-11-07 DIAGNOSIS — R202 Paresthesia of skin: Secondary | ICD-10-CM | POA: Insufficient documentation

## 2015-11-07 DIAGNOSIS — M47816 Spondylosis without myelopathy or radiculopathy, lumbar region: Secondary | ICD-10-CM

## 2015-11-07 DIAGNOSIS — M79606 Pain in leg, unspecified: Secondary | ICD-10-CM | POA: Diagnosis present

## 2015-11-07 DIAGNOSIS — M5030 Other cervical disc degeneration, unspecified cervical region: Secondary | ICD-10-CM

## 2015-11-07 MED ORDER — BUPIVACAINE HCL (PF) 0.25 % IJ SOLN
INTRAMUSCULAR | Status: AC
  Administered 2015-11-07: 12:00:00
  Filled 2015-11-07: qty 30

## 2015-11-07 MED ORDER — ORPHENADRINE CITRATE 30 MG/ML IJ SOLN: 60.0000 mg | Freq: Once | INTRAMUSCULAR | Status: DC

## 2015-11-07 MED ORDER — FENTANYL CITRATE (PF) 100 MCG/2ML IJ SOLN
INTRAMUSCULAR | Status: AC
  Administered 2015-11-07: 100 ug via INTRAVENOUS
  Filled 2015-11-07: qty 2

## 2015-11-07 MED ORDER — FENTANYL CITRATE (PF) 100 MCG/2ML IJ SOLN: 100.0000 ug | Freq: Once | INTRAMUSCULAR | Status: DC

## 2015-11-07 MED ORDER — ORPHENADRINE CITRATE 30 MG/ML IJ SOLN
INTRAMUSCULAR | Status: AC
  Administered 2015-11-07: 12:00:00
  Filled 2015-11-07: qty 2

## 2015-11-07 MED ORDER — BUPIVACAINE HCL (PF) 0.25 % IJ SOLN: 30.0000 mL | Freq: Once | INTRAMUSCULAR | Status: DC

## 2015-11-07 MED ORDER — TRIAMCINOLONE ACETONIDE 40 MG/ML IJ SUSP: 40.0000 mg | Freq: Once | INTRAMUSCULAR | Status: DC

## 2015-11-07 MED ORDER — OXYCODONE HCL 10 MG PO TABS: ORAL_TABLET | ORAL | Status: DC

## 2015-11-07 MED ORDER — TRIAMCINOLONE ACETONIDE 40 MG/ML IJ SUSP
INTRAMUSCULAR | Status: AC
  Administered 2015-11-07: 12:00:00
  Filled 2015-11-07: qty 1

## 2015-11-07 MED ORDER — CEFAZOLIN SODIUM 1-5 GM-% IV SOLN: 1.0000 g | Freq: Once | INTRAVENOUS | Status: DC

## 2015-11-07 MED ORDER — CEFAZOLIN SODIUM 1 G IJ SOLR
INTRAMUSCULAR | Status: AC
  Administered 2015-11-07: 12:00:00 via INTRAVENOUS
  Filled 2015-11-07: qty 10

## 2015-11-07 MED ORDER — MIDAZOLAM HCL 5 MG/5ML IJ SOLN: 5.0000 mg | Freq: Once | INTRAMUSCULAR | Status: DC

## 2015-11-07 MED ORDER — LACTATED RINGERS IV SOLN: 1000.0000 mL | INTRAVENOUS | Status: DC

## 2015-11-07 MED ORDER — LIDOCAINE HCL (PF) 1 % IJ SOLN: 10.0000 mL | Freq: Once | INTRAMUSCULAR | Status: DC

## 2015-11-07 MED ORDER — CEFUROXIME AXETIL 250 MG PO TABS: 250.0000 mg | ORAL_TABLET | Freq: Two times a day (BID) | ORAL | Status: DC

## 2015-11-07 MED ORDER — MIDAZOLAM HCL 5 MG/5ML IJ SOLN
INTRAMUSCULAR | Status: AC
  Administered 2015-11-07: 5 mg via INTRAVENOUS
  Filled 2015-11-07: qty 5

## 2015-11-07 MED ORDER — TOPIRAMATE 50 MG PO TABS: ORAL_TABLET | ORAL | Status: DC

## 2015-11-07 NOTE — Patient Instructions (Addendum)
PLAN   Continue present medication Topamax and oxycodone. Please obtain Ceftin antibiotic today and begin taking Ceftin antibiotic today as prescribed  F/U PCP Dr. Lennox Grumbles evaliation of  BP and general medical  Condition..  F/U surgical evaluation as discussed. Follow up with neurosurgeon as discussed for further evaluation  F/U neurological evaluation. May consider pending follow-up evaluations  May consider radiofrequency rhizolysis or intraspinal procedures pending response to present treatment and F/U evaluation   Patient to call [facility] should patient have concerns prior to scheduled return appointment.GENERAL RISKS AND COMPLICATIONS  What are the risk, side effects and possible complications? Generally speaking, most procedures are safe.  However, with any procedure there are risks, side effects, and the possibility of complications.  The risks and complications are dependent upon the sites that are lesioned, or the type of nerve block to be performed.  The closer the procedure is to the spine, the more serious the risks are.  Great care is taken when placing the radio frequency needles, block needles or lesioning probes, but sometimes complications can occur. 1. Infection: Any time there is an injection through the skin, there is a risk of infection.  This is why sterile conditions are used for these blocks.  There are four possible types of infection. 1. Localized skin infection. 2. Central Nervous System Infection-This can be in the form of Meningitis, which can be deadly. 3. Epidural Infections-This can be in the form of an epidural abscess, which can cause pressure inside of the spine, causing compression of the spinal cord with subsequent paralysis. This would require an emergency surgery to decompress, and there are no guarantees that the patient would recover from the paralysis. 4. Discitis-This is an infection of the intervertebral discs.  It occurs in about 1% of  discography procedures.  It is difficult to treat and it may lead to surgery.        2. Pain: the needles have to go through skin and soft tissues, will cause soreness.       3. Damage to internal structures:  The nerves to be lesioned may be near blood vessels or    other nerves which can be potentially damaged.       4. Bleeding: Bleeding is more common if the patient is taking blood thinners such as  aspirin, Coumadin, Ticiid, Plavix, etc., or if he/she have some genetic predisposition  such as hemophilia. Bleeding into the spinal canal can cause compression of the spinal  cord with subsequent paralysis.  This would require an emergency surgery to  decompress and there are no guarantees that the patient would recover from the  paralysis.       5. Pneumothorax:  Puncturing of a lung is a possibility, every time a needle is introduced in  the area of the chest or upper back.  Pneumothorax refers to free air around the  collapsed lung(s), inside of the thoracic cavity (chest cavity).  Another two possible  complications related to a similar event would include: Hemothorax and Chylothorax.   These are variations of the Pneumothorax, where instead of air around the collapsed  lung(s), you may have blood or chyle, respectively.       6. Spinal headaches: They may occur with any procedures in the area of the spine.       7. Persistent CSF (Cerebro-Spinal Fluid) leakage: This is a rare problem, but may occur  with prolonged intrathecal or epidural catheters either due to the formation of a  fistulous  track or a dural tear.       8. Nerve damage: By working so close to the spinal cord, there is always a possibility of  nerve damage, which could be as serious as a permanent spinal cord injury with  paralysis.       9. Death:  Although rare, severe deadly allergic reactions known as "Anaphylactic  reaction" can occur to any of the medications used.      10. Worsening of the symptoms:  We can always make thing  worse.  What are the chances of something like this happening? Chances of any of this occuring are extremely low.  By statistics, you have more of a chance of getting killed in a motor vehicle accident: while driving to the hospital than any of the above occurring .  Nevertheless, you should be aware that they are possibilities.  In general, it is similar to taking a shower.  Everybody knows that you can slip, hit your head and get killed.  Does that mean that you should not shower again?  Nevertheless always keep in mind that statistics do not mean anything if you happen to be on the wrong side of them.  Even if a procedure has a 1 (one) in a 1,000,000 (million) chance of going wrong, it you happen to be that one..Also, keep in mind that by statistics, you have more of a chance of having something go wrong when taking medications.  Who should not have this procedure? If you are on a blood thinning medication (e.g. Coumadin, Plavix, see list of "Blood Thinners"), or if you have an active infection going on, you should not have the procedure.  If you are taking any blood thinners, please inform your physician.  How should I prepare for this procedure?  Do not eat or drink anything at least six hours prior to the procedure.  Bring a driver with you .  It cannot be a taxi.  Come accompanied by an adult that can drive you back, and that is strong enough to help you if your legs get weak or numb from the local anesthetic.  Take all of your medicines the morning of the procedure with just enough water to swallow them.  If you have diabetes, make sure that you are scheduled to have your procedure done first thing in the morning, whenever possible.  If you have diabetes, take only half of your insulin dose and notify our nurse that you have done so as soon as you arrive at the clinic.  If you are diabetic, but only take blood sugar pills (oral hypoglycemic), then do not take them on the morning of your  procedure.  You may take them after you have had the procedure.  Do not take aspirin or any aspirin-containing medications, at least eleven (11) days prior to the procedure.  They may prolong bleeding.  Wear loose fitting clothing that may be easy to take off and that you would not mind if it got stained with Betadine or blood.  Do not wear any jewelry or perfume  Remove any nail coloring.  It will interfere with some of our monitoring equipment.  NOTE: Remember that this is not meant to be interpreted as a complete list of all possible complications.  Unforeseen problems may occur.  BLOOD THINNERS The following drugs contain aspirin or other products, which can cause increased bleeding during surgery and should not be taken for 2 weeks prior to and 1 week  after surgery.  If you should need take something for relief of minor pain, you may take acetaminophen which is found in Tylenol,m Datril, Anacin-3 and Panadol. It is not blood thinner. The products listed below are.  Do not take any of the products listed below in addition to any listed on your instruction sheet.  A.P.C or A.P.C with Codeine Codeine Phosphate Capsules #3 Ibuprofen Ridaura  ABC compound Congesprin Imuran rimadil  Advil Cope Indocin Robaxisal  Alka-Seltzer Effervescent Pain Reliever and Antacid Coricidin or Coricidin-D  Indomethacin Rufen  Alka-Seltzer plus Cold Medicine Cosprin Ketoprofen S-A-C Tablets  Anacin Analgesic Tablets or Capsules Coumadin Korlgesic Salflex  Anacin Extra Strength Analgesic tablets or capsules CP-2 Tablets Lanoril Salicylate  Anaprox Cuprimine Capsules Levenox Salocol  Anexsia-D Dalteparin Magan Salsalate  Anodynos Darvon compound Magnesium Salicylate Sine-off  Ansaid Dasin Capsules Magsal Sodium Salicylate  Anturane Depen Capsules Marnal Soma  APF Arthritis pain formula Dewitt's Pills Measurin Stanback  Argesic Dia-Gesic Meclofenamic Sulfinpyrazone  Arthritis Bayer Timed Release Aspirin  Diclofenac Meclomen Sulindac  Arthritis pain formula Anacin Dicumarol Medipren Supac  Analgesic (Safety coated) Arthralgen Diffunasal Mefanamic Suprofen  Arthritis Strength Bufferin Dihydrocodeine Mepro Compound Suprol  Arthropan liquid Dopirydamole Methcarbomol with Aspirin Synalgos  ASA tablets/Enseals Disalcid Micrainin Tagament  Ascriptin Doan's Midol Talwin  Ascriptin A/D Dolene Mobidin Tanderil  Ascriptin Extra Strength Dolobid Moblgesic Ticlid  Ascriptin with Codeine Doloprin or Doloprin with Codeine Momentum Tolectin  Asperbuf Duoprin Mono-gesic Trendar  Aspergum Duradyne Motrin or Motrin IB Triminicin  Aspirin plain, buffered or enteric coated Durasal Myochrisine Trigesic  Aspirin Suppositories Easprin Nalfon Trillsate  Aspirin with Codeine Ecotrin Regular or Extra Strength Naprosyn Uracel  Atromid-S Efficin Naproxen Ursinus  Auranofin Capsules Elmiron Neocylate Vanquish  Axotal Emagrin Norgesic Verin  Azathioprine Empirin or Empirin with Codeine Normiflo Vitamin E  Azolid Emprazil Nuprin Voltaren  Bayer Aspirin plain, buffered or children's or timed BC Tablets or powders Encaprin Orgaran Warfarin Sodium  Buff-a-Comp Enoxaparin Orudis Zorpin  Buff-a-Comp with Codeine Equegesic Os-Cal-Gesic   Buffaprin Excedrin plain, buffered or Extra Strength Oxalid   Bufferin Arthritis Strength Feldene Oxphenbutazone   Bufferin plain or Extra Strength Feldene Capsules Oxycodone with Aspirin   Bufferin with Codeine Fenoprofen Fenoprofen Pabalate or Pabalate-SF   Buffets II Flogesic Panagesic   Buffinol plain or Extra Strength Florinal or Florinal with Codeine Panwarfarin   Buf-Tabs Flurbiprofen Penicillamine   Butalbital Compound Four-way cold tablets Penicillin   Butazolidin Fragmin Pepto-Bismol   Carbenicillin Geminisyn Percodan   Carna Arthritis Reliever Geopen Persantine   Carprofen Gold's salt Persistin   Chloramphenicol Goody's Phenylbutazone   Chloromycetin Haltrain Piroxlcam    Clmetidine heparin Plaquenil   Cllnoril Hyco-pap Ponstel   Clofibrate Hydroxy chloroquine Propoxyphen         Before stopping any of these medications, be sure to consult the physician who ordered them.  Some, such as Coumadin (Warfarin) are ordered to prevent or treat serious conditions such as "deep thrombosis", "pumonary embolisms", and other heart problems.  The amount of time that you may need off of the medication may also vary with the medication and the reason for which you were taking it.  If you are taking any of these medications, please make sure you notify your pain physician before you undergo any procedures.         Pain Management Discharge Instructions  General Discharge Instructions :  If you need to reach your doctor call: Monday-Friday 8:00 am - 4:00 pm at [phone] or toll free [phone].  After clinic hours [phone] to have operator reach doctor.  Bring all of your medication bottles to all your appointments in the pain clinic.  To cancel or reschedule your appointment with Pain Management please remember to call 24 hours in advance to avoid a fee.  Refer to the educational materials which you have been given on: General Risks, I had my Procedure. Discharge Instructions, Post Sedation.  Post Procedure Instructions:  The drugs you were given will stay in your system until tomorrow, so for the next 24 hours you should not drive, make any legal decisions or drink any alcoholic beverages.  You may eat anything you prefer, but it is better to start with liquids then soups and crackers, and gradually work up to solid foods.  Please notify your doctor immediately if you have any unusual bleeding, trouble breathing or pain that is not related to your normal pain.  Depending on the type of procedure that was done, some parts of your body may feel week and/or numb.  This usually clears up by tonight or the next day.  Walk with the use of an assistive  device or accompanied by an adult for the 24 hours.  You may use ice on the affected area for the first 24 hours.  Put ice in a Ziploc bag and cover with a towel and place against area 15 minutes on 15 minutes off.  You may switch to heat after 24 hours.

## 2015-11-07 NOTE — Progress Notes (Signed)
Safety precautions to be maintained throughout the outpatient stay will include: orient to surroundings, keep bed in low position, maintain call bell within reach at all times, provide assistance with transfer out of bed and ambulation.  

## 2015-11-07 NOTE — Progress Notes (Signed)
Subjective:    Patient ID: Jill Rivas, female    DOB: Feb 17, 1971, 45 y.o.   MRN: [id number]  HPI  PROCEDURE PERFORMED: Lumbosacral selective nerve root block   NOTE: The patient is a 45 y.o. female who returns to [facility] for further evaluation and treatment of pain involving the lumbar and lower extremity region. The patient is status post trauma and surgery of the left lower extremity with burning stinging throbbing pain of the left lower extremity. There is concern regarding component of complex regional pain syndrome being present and contributing to patient's left lower extremity pain paresthesias and cyanotic hue of the lower extremity. The patient has had more significant relief of pain following somatic blocks compared to the lumbar sympathetic block we will therefore proceed with lumbosacral selective nerve root block to achieve a more effective block with more relief of patient's symptoms  The risks, benefits, and expectations of the procedure have been explained to the patient who was understanding and in agreement with suggested treatment plan. We will proceed with interventional treatment as discussed and as explained to the patient. The patient is understanding and in agreement with suggested treatment plan.   DESCRIPTION OF PROCEDURE: Lumbosacral selective nerve root block with IV Versed, IV fentanyl conscious sedation, EKG, blood pressure, pulse, and pulse oximetry monitoring. The procedure was performed with the patient in the prone position under fluoroscopic guidance. With the patient in the prone position, Betadine prep of proposed entry site was performed. Local anesthetic skin wheal of proposed needle entry site was prepared with 1.5% plain lidocaine with AP view of the lumbosacral spine.   PROCEDURE #1: Needle placement at the left L 2 vertebral body: A 22 -gauge needle was inserted at the inferior border of the transverse process of the vertebral body  with needle placed medial to the midline of the transverse process on AP view of the lumbosacral spine.   NEEDLE PLACEMENT AT  L3, L4, and L5  VERTEBRAL BODY LEVELS  Needle  placement was accomplished at L3, L4, and L5  vertebral body levels on the left side exactly as was accomplished at the L2  vertebral body level  and utilizing the same technique and under fluoroscopic guidance.  PROCEDURE #4: Needle placement at the S1 foramen. With the patient in the prone position with Betadine prep of proposed entry site accomplished, the S1 foramen was visualized under fluoroscopic guidance with AP view of the lumbosacral spine with cephalad orientation of the fluoroscope with local anesthetic skin wheal of 1.5% lidocaine of proposed needle entry site prepared. A 22-gauge needle was inserted S1 foramen under fluoroscopic guidance eliciting paresthesias radiating from the buttocks to the lower extremity after which needle was slightly withdrawn.   Needle placement was then verified on lateral view at all levels with needle tip documented to be in the posterior superior quadrant of the intervertebral foramen of  L 2, L3, L4, and L5. Following negative aspiration for heme and CSF at each level, each level was injected with 3 mL of 0.25% bupivacaine with Kenalog.    The patient tolerated the procedure well. A total of 10 mg of Kenalog was utilized for the procedure.   PLAN:  1. Medications: Will continue presently prescribed medications Topamax and oxycodone. 2. The patient is to undergo follow-up evaluation with PCP Dr. Lennox Grumbles for evaluation of blood pressure and general medical condition status post procedure performed on today's visit. 3. Surgical follow-up evaluation. Patient will follow up with surgeon as  discussed 4. Neurological evaluation. Has been addressed 5. May consider radiofrequency procedures, implantation type procedures and other treatment pending response to treatment and follow-up  evaluation. 6. The patient has been advise do adhere to proper body mechanics and avoid activities which may aggravate condition. 7. The patient has been advised to call the [facility] Management Center prior to scheduled return appointment should there be significant change in the patient's condition or should the patient have other concerns regarding condition prior to scheduled return appointment.   Review of Systems     Objective:   Physical Exam        Assessment & Plan:

## 2015-11-08 ENCOUNTER — Telehealth: Payer: Self-pay | Admitting: *Deleted

## 2015-11-08 NOTE — Telephone Encounter (Signed)
Patient verbalizes no complications from procedure on yesterday. 

## 2015-11-13 ENCOUNTER — Telehealth: Payer: Self-pay | Admitting: Pain Medicine

## 2015-11-13 ENCOUNTER — Other Ambulatory Visit: Payer: Self-pay | Admitting: Pain Medicine

## 2015-11-13 MED ORDER — CEFUROXIME AXETIL 250 MG PO TABS: 250.0000 mg | ORAL_TABLET | Freq: Two times a day (BID) | ORAL | Status: DC

## 2015-11-13 NOTE — Telephone Encounter (Signed)
Only got script for topramax script when she was here, was supposed to get scripts printed due to changing pharmacy, please call patient

## 2015-11-13 NOTE — Telephone Encounter (Signed)
Nurses Please ask patient if she can take Ceftin antibiotic Ceftin has been previously prescribed for patient According to the record, oxycodone and Topamax were prescribed for patient on February 15  Please discussed with me I will prescribe Ceftin if patient states that she can still tolerate Ceftin

## 2015-11-13 NOTE — Telephone Encounter (Signed)
Dr. Primus Bravo, This patient was asking about her antibiotic. I told her she probably doesn't need the antibiotic at this point, 1 week after the procedure. She also says she did not receive her prescription for Oxycodone. It was printed. Please advise.

## 2015-11-14 ENCOUNTER — Other Ambulatory Visit: Payer: Self-pay | Admitting: Pain Medicine

## 2015-11-14 MED ORDER — TOPIRAMATE 50 MG PO TABS: ORAL_TABLET | ORAL | Status: DC

## 2015-11-14 MED ORDER — CEFUROXIME AXETIL 250 MG PO TABS: 250.0000 mg | ORAL_TABLET | Freq: Two times a day (BID) | ORAL | Status: DC

## 2015-11-14 MED ORDER — OXYCODONE HCL 10 MG PO TABS: ORAL_TABLET | ORAL | Status: DC

## 2015-11-14 MED ORDER — CEFUROXIME AXETIL 250 MG PO TABS
250.0000 mg | ORAL_TABLET | Freq: Two times a day (BID) | ORAL | Status: DC
Start: 2015-11-14 — End: 2017-07-10

## 2015-11-14 NOTE — Telephone Encounter (Signed)
Patient contacted, informed her that Dr. Primus Bravo has agreed to reprint Oxycodone. Also Ceftin will be printed.

## 2015-11-21 ENCOUNTER — Other Ambulatory Visit: Payer: Self-pay | Admitting: Pain Medicine

## 2015-12-04 ENCOUNTER — Encounter: Payer: Self-pay | Admitting: Pain Medicine

## 2015-12-04 ENCOUNTER — Ambulatory Visit: Payer: Medicare Other | Attending: Pain Medicine | Admitting: Pain Medicine

## 2015-12-04 ENCOUNTER — Telehealth: Payer: Self-pay | Admitting: Pain Medicine

## 2015-12-04 VITALS — BP 128/79 | HR 95 | Temp 98.2°F | Resp 16 | Ht 67.0 in | Wt 190.0 lb

## 2015-12-04 DIAGNOSIS — M5010 Cervical disc disorder with radiculopathy, unspecified cervical region: Secondary | ICD-10-CM | POA: Insufficient documentation

## 2015-12-04 DIAGNOSIS — M47892 Other spondylosis, cervical region: Secondary | ICD-10-CM | POA: Diagnosis not present

## 2015-12-04 DIAGNOSIS — M545 Low back pain: Secondary | ICD-10-CM | POA: Diagnosis present

## 2015-12-04 DIAGNOSIS — M79606 Pain in leg, unspecified: Secondary | ICD-10-CM | POA: Diagnosis present

## 2015-12-04 DIAGNOSIS — M47816 Spondylosis without myelopathy or radiculopathy, lumbar region: Secondary | ICD-10-CM

## 2015-12-04 DIAGNOSIS — M5137 Other intervertebral disc degeneration, lumbosacral region: Secondary | ICD-10-CM

## 2015-12-04 DIAGNOSIS — G90522 Complex regional pain syndrome I of left lower limb: Secondary | ICD-10-CM

## 2015-12-04 DIAGNOSIS — M5030 Other cervical disc degeneration, unspecified cervical region: Secondary | ICD-10-CM

## 2015-12-04 DIAGNOSIS — M533 Sacrococcygeal disorders, not elsewhere classified: Secondary | ICD-10-CM | POA: Insufficient documentation

## 2015-12-04 DIAGNOSIS — M5416 Radiculopathy, lumbar region: Secondary | ICD-10-CM

## 2015-12-04 MED ORDER — OXYCODONE HCL 10 MG PO TABS: ORAL_TABLET | ORAL | Status: DC

## 2015-12-04 MED ORDER — CIPROFLOXACIN HCL 500 MG PO TABS: ORAL_TABLET | ORAL | Status: DC

## 2015-12-04 NOTE — Patient Instructions (Addendum)
PLAN   Continue present medication Topamax and oxycodone and begin Cipro  Lumbar selective nerve root block to be performed at time return appointment  F/U PCP Dr. Posey Pronto  for evaliation of low BP left lower extremity lesion and general medical condition as discussed.  F/U surgical evaluation as discussed. Consider neurosurgical reevaluation especially of left upper extremity weakness as we discussed.  F/U neurological evaluation. May consider PNCV/EMG studies and other studies pending follow-up evaluations  Wound Care clinic evaluation to be considered as discussed  May consider radiofrequency rhizolysis or intraspinal procedures pending response to present treatment and F/U evaluation   Patient to call Pain Management Center should patient have concerns prior to scheduled return appointment.GENERAL RISKS AND COMPLICATIONS  What are the risk, side effects and possible complications? Generally speaking, most procedures are safe.  However, with any procedure there are risks, side effects, and the possibility of complications.  The risks and complications are dependent upon the sites that are lesioned, or the type of nerve block to be performed.  The closer the procedure is to the spine, the more serious the risks are.  Great care is taken when placing the radio frequency needles, block needles or lesioning probes, but sometimes complications can occur. 1. Infection: Any time there is an injection through the skin, there is a risk of infection.  This is why sterile conditions are used for these blocks.  There are four possible types of infection. 1. Localized skin infection. 2. Central Nervous System Infection-This can be in the form of Meningitis, which can be deadly. 3. Epidural Infections-This can be in the form of an epidural abscess, which can cause pressure inside of the spine, causing compression of the spinal cord with subsequent paralysis. This would require an emergency surgery to  decompress, and there are no guarantees that the patient would recover from the paralysis. 4. Discitis-This is an infection of the intervertebral discs.  It occurs in about 1% of discography procedures.  It is difficult to treat and it may lead to surgery.        2. Pain: the needles have to go through skin and soft tissues, will cause soreness.       3. Damage to internal structures:  The nerves to be lesioned may be near blood vessels or    other nerves which can be potentially damaged.       4. Bleeding: Bleeding is more common if the patient is taking blood thinners such as  aspirin, Coumadin, Ticiid, Plavix, etc., or if he/she have some genetic predisposition  such as hemophilia. Bleeding into the spinal canal can cause compression of the spinal  cord with subsequent paralysis.  This would require an emergency surgery to  decompress and there are no guarantees that the patient would recover from the  paralysis.       5. Pneumothorax:  Puncturing of a lung is a possibility, every time a needle is introduced in  the area of the chest or upper back.  Pneumothorax refers to free air around the  collapsed lung(s), inside of the thoracic cavity (chest cavity).  Another two possible  complications related to a similar event would include: Hemothorax and Chylothorax.   These are variations of the Pneumothorax, where instead of air around the collapsed  lung(s), you may have blood or chyle, respectively.       6. Spinal headaches: They may occur with any procedures in the area of the spine.  7. Persistent CSF (Cerebro-Spinal Fluid) leakage: This is a rare problem, but may occur  with prolonged intrathecal or epidural catheters either due to the formation of a fistulous  track or a dural tear.       8. Nerve damage: By working so close to the spinal cord, there is always a possibility of  nerve damage, which could be as serious as a permanent spinal cord injury with  paralysis.       9. Death:  Although  rare, severe deadly allergic reactions known as "Anaphylactic  reaction" can occur to any of the medications used.      10. Worsening of the symptoms:  We can always make thing worse.  What are the chances of something like this happening? Chances of any of this occuring are extremely low.  By statistics, you have more of a chance of getting killed in a motor vehicle accident: while driving to the hospital than any of the above occurring .  Nevertheless, you should be aware that they are possibilities.  In general, it is similar to taking a shower.  Everybody knows that you can slip, hit your head and get killed.  Does that mean that you should not shower again?  Nevertheless always keep in mind that statistics do not mean anything if you happen to be on the wrong side of them.  Even if a procedure has a 1 (one) in a 1,000,000 (million) chance of going wrong, it you happen to be that one..Also, keep in mind that by statistics, you have more of a chance of having something go wrong when taking medications.  Who should not have this procedure? If you are on a blood thinning medication (e.g. Coumadin, Plavix, see list of "Blood Thinners"), or if you have an active infection going on, you should not have the procedure.  If you are taking any blood thinners, please inform your physician.  How should I prepare for this procedure?  Do not eat or drink anything at least six hours prior to the procedure.  Bring a driver with you .  It cannot be a taxi.  Come accompanied by an adult that can drive you back, and that is strong enough to help you if your legs get weak or numb from the local anesthetic.  Take all of your medicines the morning of the procedure with just enough water to swallow them.  If you have diabetes, make sure that you are scheduled to have your procedure done first thing in the morning, whenever possible.  If you have diabetes, take only half of your insulin dose and notify our nurse  that you have done so as soon as you arrive at the clinic.  If you are diabetic, but only take blood sugar pills (oral hypoglycemic), then do not take them on the morning of your procedure.  You may take them after you have had the procedure.  Do not take aspirin or any aspirin-containing medications, at least eleven (11) days prior to the procedure.  They may prolong bleeding.  Wear loose fitting clothing that may be easy to take off and that you would not mind if it got stained with Betadine or blood.  Do not wear any jewelry or perfume  Remove any nail coloring.  It will interfere with some of our monitoring equipment.  NOTE: Remember that this is not meant to be interpreted as a complete list of all possible complications.  Unforeseen problems may occur.  BLOOD THINNERS  The following drugs contain aspirin or other products, which can cause increased bleeding during surgery and should not be taken for 2 weeks prior to and 1 week after surgery.  If you should need take something for relief of minor pain, you may take acetaminophen which is found in Tylenol,m Datril, Anacin-3 and Panadol. It is not blood thinner. The products listed below are.  Do not take any of the products listed below in addition to any listed on your instruction sheet.  A.P.C or A.P.C with Codeine Codeine Phosphate Capsules #3 Ibuprofen Ridaura  ABC compound Congesprin Imuran rimadil  Advil Cope Indocin Robaxisal  Alka-Seltzer Effervescent Pain Reliever and Antacid Coricidin or Coricidin-D  Indomethacin Rufen  Alka-Seltzer plus Cold Medicine Cosprin Ketoprofen S-A-C Tablets  Anacin Analgesic Tablets or Capsules Coumadin Korlgesic Salflex  Anacin Extra Strength Analgesic tablets or capsules CP-2 Tablets Lanoril Salicylate  Anaprox Cuprimine Capsules Levenox Salocol  Anexsia-D Dalteparin Magan Salsalate  Anodynos Darvon compound Magnesium Salicylate Sine-off  Ansaid Dasin Capsules Magsal Sodium Salicylate  Anturane  Depen Capsules Marnal Soma  APF Arthritis pain formula Dewitt's Pills Measurin Stanback  Argesic Dia-Gesic Meclofenamic Sulfinpyrazone  Arthritis Bayer Timed Release Aspirin Diclofenac Meclomen Sulindac  Arthritis pain formula Anacin Dicumarol Medipren Supac  Analgesic (Safety coated) Arthralgen Diffunasal Mefanamic Suprofen  Arthritis Strength Bufferin Dihydrocodeine Mepro Compound Suprol  Arthropan liquid Dopirydamole Methcarbomol with Aspirin Synalgos  ASA tablets/Enseals Disalcid Micrainin Tagament  Ascriptin Doan's Midol Talwin  Ascriptin A/D Dolene Mobidin Tanderil  Ascriptin Extra Strength Dolobid Moblgesic Ticlid  Ascriptin with Codeine Doloprin or Doloprin with Codeine Momentum Tolectin  Asperbuf Duoprin Mono-gesic Trendar  Aspergum Duradyne Motrin or Motrin IB Triminicin  Aspirin plain, buffered or enteric coated Durasal Myochrisine Trigesic  Aspirin Suppositories Easprin Nalfon Trillsate  Aspirin with Codeine Ecotrin Regular or Extra Strength Naprosyn Uracel  Atromid-S Efficin Naproxen Ursinus  Auranofin Capsules Elmiron Neocylate Vanquish  Axotal Emagrin Norgesic Verin  Azathioprine Empirin or Empirin with Codeine Normiflo Vitamin E  Azolid Emprazil Nuprin Voltaren  Bayer Aspirin plain, buffered or children's or timed BC Tablets or powders Encaprin Orgaran Warfarin Sodium  Buff-a-Comp Enoxaparin Orudis Zorpin  Buff-a-Comp with Codeine Equegesic Os-Cal-Gesic   Buffaprin Excedrin plain, buffered or Extra Strength Oxalid   Bufferin Arthritis Strength Feldene Oxphenbutazone   Bufferin plain or Extra Strength Feldene Capsules Oxycodone with Aspirin   Bufferin with Codeine Fenoprofen Fenoprofen Pabalate or Pabalate-SF   Buffets II Flogesic Panagesic   Buffinol plain or Extra Strength Florinal or Florinal with Codeine Panwarfarin   Buf-Tabs Flurbiprofen Penicillamine   Butalbital Compound Four-way cold tablets Penicillin   Butazolidin Fragmin Pepto-Bismol   Carbenicillin  Geminisyn Percodan   Carna Arthritis Reliever Geopen Persantine   Carprofen Gold's salt Persistin   Chloramphenicol Goody's Phenylbutazone   Chloromycetin Haltrain Piroxlcam   Clmetidine heparin Plaquenil   Cllnoril Hyco-pap Ponstel   Clofibrate Hydroxy chloroquine Propoxyphen         Before stopping any of these medications, be sure to consult the physician who ordered them.  Some, such as Coumadin (Warfarin) are ordered to prevent or treat serious conditions such as "deep thrombosis", "pumonary embolisms", and other heart problems.  The amount of time that you may need off of the medication may also vary with the medication and the reason for which you were taking it.  If you are taking any of these medications, please make sure you notify your pain physician before you undergo any procedures.         Lumbar Sympathetic Block  Patient Information  Description: The lumbar plexus is a group of nerves that are part of the sympathetic nervous system.  These nerves supply organs in the pelvis and legs.  Lumbar sympathetic blocks are utilized for the diagnosis and treatment of painful conditions in these areas.   The lumbar plexus is located on both sides of the aorta at approximately the level of the second lumbar vertebral body.  The block will be performed with you lying on your abdomen with a pillow underneath.  Using direct x-ray guidance,   The plexus will be located on both sides of the spine.  Numbing medicine will be used to deaden the skin prior to needle insertion.  In most cases, a small amount of sedation can be give by IV prior to the numbing medicine.  One or two small needles will be placed near the plexus and local anesthetic will be injected.  This may make your leg(s) feel warm.  The Entire block usually lasts about 15-25 minutes.  Conditions which may be treated by lumbar sympathetic block:   Reflex sympathetic dystrophy  Phantom limb pain  Peripheral  neuropathy  Peripheral vascular disease ( inadequate blood flow )  Cancer pain of pelvis, leg and kidney  Preparation for the injection:  1. Do note eat any solid food or diary products within 8 hours of your appointment. 2. You may drink clear liquids up to 3 hours before appointment.  Clear liquids include water, black coffee, juice or soda.  No milk or cream please. 3. You may take your regular medication, including pain medications, with a sip of water before you appointment.  Diabetics should hold regular insulin ( if taken separately ) and take 1/2 NPH dose the morning of the procedure .  Carry some sugar containing items with you to your appointment. 4. A driver must accompany you and be prepared to drive you home after your procedure. 5. Bring all your current medication with you. 6. An IV may be inserted and sedation may be given at the discretion of the physician.  7. A blood pressure cuff, EKG and other monitors will often be applied during the procedure.  Some patients may need to have extra oxygen administered for a short period. 8. You will be asked to provide medical information, including your allergies and medications, prior to the procedure.  We must know immediately if your taking blood thinners (like Coumadin/Warfarin) or if you are allergic to IV iodine contrast (dye).  We must know if you could possibly be pregnant.  Possible side-effects   Bleeding from needle site or deeper  Infection (rare, can require surgery)  Nerve injury (rare)  Numbness & tingling (temporary)  Collapsed lung (rare)  Spinal headache (a headache worse with upright posture)  Light-headedness (temporary)  Pain at injection site (several days)  Decreased blood pressure (temporary)  Weakness in legs (temporary)  Seizure or other drug reaction (rare)  Call if you experience:   Fever/chills associated with headache or increased back/ neck pain  Headache worsened by an upright  position  New onset weakness or numbness of an extremity below the injection site  Hives or difficulty breathing ( go to the emergency room)  Inflammation or drainage at the injections site(s)  New symptoms which are concerning to you  Please note:  If effective, we will often do a series of 2-3 injections spaced 3-6 weeks apart to maximally decrease your pain.  If initial series is effective, you may be a candidate  for a more permanent block of the lumbar sympathetic plexus.  If you have any questions please call [phone] [facility]

## 2015-12-04 NOTE — Progress Notes (Signed)
Subjective:    Patient ID: Jill Rivas, female    DOB: Jun 07, 1971, 45 y.o.   MRN: [id number]  HPI  The patient is a 45 year old female who returns to pain management for further evaluation and treatment of pain involving the lower back and lower extremity region. The patient has been felt to be with component of complex regional pain syndrome involving the left lower extremity. The patient stated that recently. The SCRATCHED her left lower extremity. The patient has had increased pain of the left lower extremity with some discoloration of the left lower extremity noted. We discussed patient's condition and will proceed with interventional treatment at time return appointment consisting of lumbosacral selective nerve root block which has been more begin treating patient's symptoms then lumbar sympathetic block. We will also prescribed Cipro for patient to avoid any infection following the scratched. The patient states that the pain of the left lower extremity has increased significantly. The patient is without evidence of increased warmth and erythema of significant degree in the region of the left lower extremity. We will proceed with interventional treatment for left lower extremity pain at time return appointment. Also discussed patient's pain involving the neck and upper extremity region and have advised patient to proceed with neurosurgical evaluation which would be reevaluation at this time and patient will do such immediately. Please schedule patient for reevaluation of her pain of the neck upper extremity pain paresthesias and weakness as well. All agreed to suggested treatment plan.  Review of Systems     Objective:   Physical Exam  There was tends to palpation of the paraspinal muscular treat and cervical region cervical facet region. There was slightly limited range of motion of the cervical spine with tenderness over the splenius capitis and occipitalis musculature region a  moderate degree. There was tenderness of the acromioclavicular and glenohumeral joint region a moderate degree. The patient appeared to be with decreased grip strength on the right compared to the left and was decreased range of motion of the upper extremities as well. There was tenderness over the region of the thoracic facet thoracic paraspinal musculature region with evidence of muscle spasms occurring in the thoracic musculature region a moderate degree. The patient appeared to be with tenderness over the lumbar paraspinal must reason lumbar facet region of mild degree to moderate degree. Lateral bending rotation extension and palpation of the lumbar facets was with mild increased pain. Palpation of the left lower extremity was attends to palpation with evidence of allodynia of the left lower extremity. There was decreased EHL strength and slight cyanotic hue of the foot noted with some increase tenderness to palpation of the lower extremity distal to the knee. Scratches of the left lower extremity were noted without evidence of drainage. There was tenderness over the PSIS and PII S region a mild degree with mild tenderness of the greater trochanteric region and iliotibial band region. There was negative clonus negative Homans. 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 Posey Pronto  for evaliation of low  BP left lower extremity lesion and general medical condition as discussed.  F/U surgical evaluation as discussed. We  rescheduled neurosurgical reevaluation especially pain of the cervical region and evaluation of left upper extremity pain and weakness as we discussed.  F/U neurological evaluation. May consider PNCV/EMG studies and other studies pending follow-up evaluations  Wound Care clinic evaluation to be considered as discussed  May consider radiofrequency rhizolysis or intraspinal procedures pending response to present treatment and F/U evaluation   Patient to call Pain Management Center should patient have concerns prior to scheduled return appointment.

## 2015-12-04 NOTE — Telephone Encounter (Signed)
The Cipro script that was sent in has reactions with Tizanidine that patient is already taking, can you prescribe a different medicine

## 2015-12-04 NOTE — Progress Notes (Signed)
Safety precautions to be maintained throughout the outpatient stay will include: orient to surroundings, keep bed in low position, maintain call bell within reach at all times, provide assistance with transfer out of bed and ambulation.  Pt has had headache since procedure

## 2015-12-06 ENCOUNTER — Other Ambulatory Visit: Payer: Self-pay | Admitting: Pain Medicine

## 2015-12-06 MED ORDER — CEFUROXIME AXETIL 500 MG PO TABS: ORAL_TABLET | ORAL | Status: DC

## 2015-12-06 NOTE — Telephone Encounter (Signed)
[facility] Please ask patient if she has any allergies and I can replace the Cipro with another medication

## 2015-12-06 NOTE — Telephone Encounter (Signed)
Left voicemail for patient to pick up new script at her pharmacy and to discontinue Cipro.

## 2015-12-07 ENCOUNTER — Telehealth: Payer: Self-pay | Admitting: *Deleted

## 2015-12-07 NOTE — Telephone Encounter (Signed)
Left voicemail that ceftin had been escribed to her pharmacy to replace Cipro.

## 2015-12-12 ENCOUNTER — Ambulatory Visit: Payer: Medicare Other | Attending: Pain Medicine | Admitting: Pain Medicine

## 2015-12-12 ENCOUNTER — Encounter: Payer: Self-pay | Admitting: Pain Medicine

## 2015-12-12 VITALS — BP 130/69 | HR 79 | Temp 97.2°F | Resp 16 | Ht 67.0 in | Wt 190.0 lb

## 2015-12-12 DIAGNOSIS — M47816 Spondylosis without myelopathy or radiculopathy, lumbar region: Secondary | ICD-10-CM | POA: Diagnosis not present

## 2015-12-12 DIAGNOSIS — M545 Low back pain: Secondary | ICD-10-CM | POA: Diagnosis present

## 2015-12-12 DIAGNOSIS — M5137 Other intervertebral disc degeneration, lumbosacral region: Secondary | ICD-10-CM

## 2015-12-12 DIAGNOSIS — M5010 Cervical disc disorder with radiculopathy, unspecified cervical region: Secondary | ICD-10-CM

## 2015-12-12 DIAGNOSIS — M533 Sacrococcygeal disorders, not elsewhere classified: Secondary | ICD-10-CM

## 2015-12-12 DIAGNOSIS — M79605 Pain in left leg: Secondary | ICD-10-CM | POA: Diagnosis not present

## 2015-12-12 DIAGNOSIS — G90522 Complex regional pain syndrome I of left lower limb: Secondary | ICD-10-CM

## 2015-12-12 DIAGNOSIS — M5416 Radiculopathy, lumbar region: Secondary | ICD-10-CM

## 2015-12-12 DIAGNOSIS — M5030 Other cervical disc degeneration, unspecified cervical region: Secondary | ICD-10-CM

## 2015-12-12 DIAGNOSIS — M79606 Pain in leg, unspecified: Secondary | ICD-10-CM | POA: Diagnosis present

## 2015-12-12 MED ORDER — FENTANYL CITRATE (PF) 100 MCG/2ML IJ SOLN
INTRAMUSCULAR | Status: AC
  Administered 2015-12-12: 100 ug
  Filled 2015-12-12: qty 2

## 2015-12-12 MED ORDER — CEFAZOLIN SODIUM 1-5 GM-% IV SOLN: 1.0000 g | Freq: Once | INTRAVENOUS | Status: DC

## 2015-12-12 MED ORDER — MIDAZOLAM HCL 5 MG/5ML IJ SOLN: 5.0000 mg | Freq: Once | INTRAMUSCULAR | Status: DC

## 2015-12-12 MED ORDER — LIDOCAINE HCL (PF) 1 % IJ SOLN: 10.0000 mL | Freq: Once | INTRAMUSCULAR | Status: DC

## 2015-12-12 MED ORDER — MIDAZOLAM HCL 5 MG/5ML IJ SOLN
INTRAMUSCULAR | Status: AC
  Administered 2015-12-12: 5 mg via INTRAVENOUS
  Filled 2015-12-12: qty 5

## 2015-12-12 MED ORDER — FENTANYL CITRATE (PF) 100 MCG/2ML IJ SOLN: 100.0000 ug | Freq: Once | INTRAMUSCULAR | Status: DC

## 2015-12-12 MED ORDER — CEFAZOLIN SODIUM 1 G IJ SOLR
INTRAMUSCULAR | Status: AC
Start: 2015-12-12 — End: 2015-12-12
  Administered 2015-12-12: 1 g via INTRAVENOUS
  Filled 2015-12-12: qty 10

## 2015-12-12 MED ORDER — BUPIVACAINE HCL (PF) 0.25 % IJ SOLN
INTRAMUSCULAR | Status: AC
  Administered 2015-12-12: 14:00:00
  Filled 2015-12-12: qty 30

## 2015-12-12 MED ORDER — BUPIVACAINE HCL (PF) 0.25 % IJ SOLN: 30.0000 mL | Freq: Once | INTRAMUSCULAR | Status: DC

## 2015-12-12 MED ORDER — TRIAMCINOLONE ACETONIDE 40 MG/ML IJ SUSP
INTRAMUSCULAR | Status: AC
  Administered 2015-12-12: 14:00:00
  Filled 2015-12-12: qty 1

## 2015-12-12 MED ORDER — TRIAMCINOLONE ACETONIDE 40 MG/ML IJ SUSP: 40.0000 mg | Freq: Once | INTRAMUSCULAR | Status: DC

## 2015-12-12 MED ORDER — ORPHENADRINE CITRATE 30 MG/ML IJ SOLN: 60.0000 mg | Freq: Once | INTRAMUSCULAR | Status: DC

## 2015-12-12 MED ORDER — LACTATED RINGERS IV SOLN: 1000.0000 mL | INTRAVENOUS | Status: DC

## 2015-12-12 MED ORDER — CEFUROXIME AXETIL 250 MG PO TABS: 250.0000 mg | ORAL_TABLET | Freq: Two times a day (BID) | ORAL | Status: DC

## 2015-12-12 MED ORDER — ORPHENADRINE CITRATE 30 MG/ML IJ SOLN
INTRAMUSCULAR | Status: AC
  Administered 2015-12-12: 14:00:00
  Filled 2015-12-12: qty 2

## 2015-12-12 NOTE — Progress Notes (Signed)
Safety precautions to be maintained throughout the outpatient stay will include: orient to surroundings, keep bed in low position, maintain call bell within reach at all times, provide assistance with transfer out of bed and ambulation.  

## 2015-12-12 NOTE — Patient Instructions (Addendum)
PLAN   Continue present medication Topamax and oxycodone. Please obtain Ceftin antibiotic today and begin taking Ceftin antibiotic today as prescribed  F/U PCP Dr. Lennox Grumbles evaliation of  BP and general medical  condition..  F/U surgical evaluation as discussed. Follow up with neurosurgeon as discussed for further evaluation of pain of the neck and upper extremity regions associated with upper extremity weakness  F/U neurological evaluation. May consider pending follow-up evaluations  May consider radiofrequency rhizolysis or intraspinal procedures pending response to present treatment and F/U evaluation   Patient to call Pain Management Center should patient have concerns prior to scheduled return appointment.Pain Management Discharge Instructions  General Discharge Instructions :  If you need to reach your doctor call: Monday-Friday 8:00 am - 4:00 pm at [phone] or toll free [phone].  After clinic hours [phone] to have operator reach doctor.  Bring all of your medication bottles to all your appointments in the pain clinic.  To cancel or reschedule your appointment with Pain Management please remember to call 24 hours in advance to avoid a fee.  Refer to the educational materials which you have been given on: General Risks, I had my Procedure. Discharge Instructions, Post Sedation.  Post Procedure Instructions:  The drugs you were given will stay in your system until tomorrow, so for the next 24 hours you should not drive, make any legal decisions or drink any alcoholic beverages.  You may eat anything you prefer, but it is better to start with liquids then soups and crackers, and gradually work up to solid foods.  Please notify your doctor immediately if you have any unusual bleeding, trouble breathing or pain that is not related to your normal pain.  Depending on the type of procedure that was done, some parts of your body may feel week and/or numb.  This usually  clears up by tonight or the next day.  Walk with the use of an assistive device or accompanied by an adult for the 24 hours.  You may use ice on the affected area for the first 24 hours.  Put ice in a Ziploc bag and cover with a towel and place against area 15 minutes on 15 minutes off.  You may switch to heat after 24 hours.

## 2015-12-12 NOTE — Progress Notes (Signed)
Subjective:    Patient ID: Jill Rivas, female    DOB: Apr 19, 1971, 44 y.o.   MRN: [id number]  HPI  PROCEDURE PERFORMED: Lumbosacral selective nerve root block   NOTE: The patient is a 45 y.o. female who [facility] for further evaluation and treatment of pain involving the lumbar and lower extremity region. Studies  Have revealed the patient to be with evidence of degenerative changes of the lumbar spine.. The patient has severe pain of the left lower extremity with history of trauma requiring surgical intervention of the left lower extremity. The patient's pain has been felt to be due to complex regional pain syndrome predominantly. . The patient has had more significant improvement of her pain with lumbosacral selective nerve root block then with lumbar sympathetic blocks. Decision has been made to proceed with lumbosacral selective nerve root block in attempt to decrease severity of patient's symptoms, minimize progression of symptoms, and avoid the need for more involved treatment. The patient agreed to suggested treatment plan The risks, benefits, and expectations of the procedure have been explained to the patient who was understanding and in agreement with suggested treatment plan. We will proceed with interventional treatment as discussed and as explained to the patient. The patient is understanding and in agreement with suggested treatment plan.   DESCRIPTION OF PROCEDURE: Lumbosacral selective nerve root block with IV Versed, IV fentanyl conscious sedation, EKG, blood pressure, pulse, and pulse oximetry monitoring. The procedure was performed with the patient in the prone position under fluoroscopic guidance. With the patient in the prone position, Betadine prep of proposed entry site was performed. Local anesthetic skin wheal of proposed needle entry site was prepared with 1.5% plain lidocaine with AP view of the lumbosacral spine.   PROCEDURE #1: Needle  placement at the left L 2 vertebral body: A 22 -gauge needle was inserted at the inferior border of the transverse process of the vertebral body with needle placed medial to the midline of the transverse process on AP view of the lumbosacral spine.   NEEDLE PLACEMENT AT  The L3, L4, and L5 VERTEBRAL BODY LEVELS  Needle  placement was accomplished at L3, L4, and L5 vertebral body levels on the left L2 side exactly as was accomplished at the L2  vertebral body level  and utilizing the same technique and under fluoroscopic guidance.  PROCEDURE #4: Needle placement at the S1 foramen. With the patient in the prone position with Betadine prep of proposed entry site accomplished, the S1 foramen was visualized under fluoroscopic guidance with AP view of the lumbosacral spine with cephalad orientation of the fluoroscope with local anesthetic skin wheal of 1.5% lidocaine of proposed needle entry site prepared. A 22-gauge needle was inserted S1 foramen under fluoroscopic guidance eliciting paresthesias radiating from the buttocks to the lower extremity after which needle was slightly withdrawn.   Needle placement was then verified on lateral view at all levels with needle tip documented to be in the posterior superior quadrant of the intervertebral foramen of  L 2, L3, L4, and L5. Following negative aspiration for heme and CSF at each level, each level was injected with 3 mL of 0.25% bupivacaine with Kenalog.    The patient tolerated the procedure well.   A total of 10 mg of Kenalog was utilized for the procedure.   PLAN:  1. Medications: Will continue presently prescribed medications Topamax and oxycodone. 2. The patient is to undergo follow-up evaluation with PCP Dr. Lennox Grumbles for evaluation  of blood pressure and general medical condition status post procedure performed on today's visit. 3. Surgical follow-up evaluation.. Patient will undergo surgical evaluation of the cervical and upper extremity region as  discussed 4. Neurological evaluation.. May consider additional studies as discussed 5. May consider radiofrequency procedures, implantation type procedures and other treatment pending response to treatment and follow-up evaluation. 6. The patient has been advise do adhere to proper body mechanics and avoid activities which may aggravate condition. 7. The patient has been advised to call the [facility] prior to scheduled return appointment should there be significant change in the patient's condition or should the patient have other concerns regarding condition prior to scheduled return appointment.   Review of Systems     Objective:   Physical Exam        Assessment & Plan:

## 2015-12-13 ENCOUNTER — Telehealth: Payer: Self-pay | Admitting: *Deleted

## 2015-12-13 NOTE — Telephone Encounter (Signed)
Left voicemail with patient to call our if she has any questions or concerns re; procedure on yesterday.

## 2016-01-10 ENCOUNTER — Ambulatory Visit: Payer: Medicare Other | Attending: Pain Medicine | Admitting: Pain Medicine

## 2016-01-10 ENCOUNTER — Encounter: Payer: Self-pay | Admitting: Pain Medicine

## 2016-01-10 VITALS — BP 112/77 | HR 76 | Temp 97.8°F | Resp 15 | Ht 67.0 in | Wt 190.0 lb

## 2016-01-10 DIAGNOSIS — M50122 Cervical disc disorder at C5-C6 level with radiculopathy: Secondary | ICD-10-CM | POA: Diagnosis not present

## 2016-01-10 DIAGNOSIS — M5416 Radiculopathy, lumbar region: Secondary | ICD-10-CM

## 2016-01-10 DIAGNOSIS — G43119 Migraine with aura, intractable, without status migrainosus: Secondary | ICD-10-CM

## 2016-01-10 DIAGNOSIS — M5137 Other intervertebral disc degeneration, lumbosacral region: Secondary | ICD-10-CM

## 2016-01-10 DIAGNOSIS — G90522 Complex regional pain syndrome I of left lower limb: Secondary | ICD-10-CM | POA: Diagnosis not present

## 2016-01-10 DIAGNOSIS — M545 Low back pain: Secondary | ICD-10-CM | POA: Diagnosis present

## 2016-01-10 DIAGNOSIS — M5030 Other cervical disc degeneration, unspecified cervical region: Secondary | ICD-10-CM

## 2016-01-10 DIAGNOSIS — M47812 Spondylosis without myelopathy or radiculopathy, cervical region: Secondary | ICD-10-CM | POA: Insufficient documentation

## 2016-01-10 DIAGNOSIS — M5481 Occipital neuralgia: Secondary | ICD-10-CM

## 2016-01-10 DIAGNOSIS — M533 Sacrococcygeal disorders, not elsewhere classified: Secondary | ICD-10-CM | POA: Diagnosis not present

## 2016-01-10 DIAGNOSIS — M5010 Cervical disc disorder with radiculopathy, unspecified cervical region: Secondary | ICD-10-CM

## 2016-01-10 DIAGNOSIS — M79606 Pain in leg, unspecified: Secondary | ICD-10-CM | POA: Diagnosis present

## 2016-01-10 DIAGNOSIS — M47816 Spondylosis without myelopathy or radiculopathy, lumbar region: Secondary | ICD-10-CM

## 2016-01-10 MED ORDER — OXYCODONE HCL 10 MG PO TABS: ORAL_TABLET | ORAL | Status: DC

## 2016-01-10 MED ORDER — LIDOCAINE 5 % EX PTCH: MEDICATED_PATCH | CUTANEOUS | Status: DC

## 2016-01-10 NOTE — Patient Instructions (Addendum)
PLAN   Continue present medication Topamax and oxycodone and apply Lidoderm patches as prescribed  Lumbar selective nerve root block to be performed at time return appointment  F/U PCP for evaliation of BP left lower extremity lesion headache and general medical condition as discussed.  F/U surgical evaluation as discussed. Consider neurosurgical reevaluation especially of left upper extremity weakness as we discussed.  F/U neurological evaluation. Ask nurses and secretary date of neurological evaluation of headaches  Wound Care clinic evaluation to be considered as discussed. We will avoid at this time  May consider radiofrequency rhizolysis or intraspinal procedures pending response to present treatment and F/U evaluation   Patient to call [facility] should patient have concerns prior to scheduled return appointment  Selective Nerve Root Block Patient Information  Description: Specific nerve roots exit the spinal canal and these nerves can be compressed and inflamed by a bulging disc and bone spurs.  By injecting steroids on the nerve root, we can potentially decrease the inflammation surrounding these nerves, which often leads to decreased pain.  Also, by injecting local anesthesia on the nerve root, this can provide [place] helpful information to give to your referring doctor if it decreases your pain.  Selective nerve root blocks can be done along the spine from the neck to the low back depending on the location of your pain.   After numbing the skin with local anesthesia, a small needle is passed to the nerve root and the position of the needle is verified using x-ray pictures.  After the needle is in correct position, we then deposit the medication.  You may experience a pressure sensation while this is being done.  The entire block usually lasts less than 15 minutes.  Conditions that may be treated with selective nerve root blocks:  Low back and leg pain  Spinal  stenosis  Diagnostic block prior to potential surgery  Neck and arm pain  Post laminectomy syndrome  Preparation for the injection:  1. Do not eat any solid food or dairy products within 8 hours of your appointment. 2. You may drink clear liquids up to 3 hours before an appointment.  Clear liquids include water, black coffee, juice or soda.  No milk or cream please. 3. You may take your regular medications, including pain medications, with a sip of water before your appointment.  Diabetics should hold regular insulin (if taken separately) and take 1/2 normal NPH dose the morning of the procedure.  Carry some sugar containing items with you to your appointment. 4. A driver must accompany you and be prepared to drive you home after your procedure. 5. Bring all your current medications with you. 6. An IV may be inserted and sedation may be given at the discretion of the physician. 7. A blood pressure cuff, EKG, and other monitors will often be applied during the procedure.  Some patients may need to have extra oxygen administered for a short period. 8. You will be asked to provide medical information, including allergies, prior to the procedure.  We must know immediately if you are taking blood  Thinners (like Coumadin) or if you are allergic to IV iodine contrast (dye).  Possible side-effects: All are usually temporary  Bleeding from needle site  Light headedness  Numbness and tingling  Decreased blood pressure  Weakness in arms/legs  Pressure sensation in back/neck  Pain at injection site (several days)  Possible complications: All are extremely rare  Infection  Nerve injury  Spinal headache (a headache  wore with upright position)  Call if you experience:  Fever/chills associated with headache or increased back/neck pain  Headache worsened by an upright position  New onset weakness or numbness of an extremity below the injection site  Hives or difficulty  breathing (go to the emergency room)  Inflammation or drainage at the injection site(s)  Severe back/neck pain greater than usual  New symptoms which are concerning to you  Please note:  Although the local anesthetic injected can often make your back or neck feel good for several hours after the injection the pain will likely return.  It takes 3-5 days for steroids to work on the nerve root. You may not notice any pain relief for at least one week.  If effective, we will often do a series of 3 injections spaced 3-6 weeks apart to maximally decrease your pain.    If you have any questions, please call [phone] [facility]

## 2016-01-10 NOTE — Progress Notes (Signed)
Subjective:    Patient ID: Jill Rivas, female    DOB: Jun 28, 1971, 45 y.o.   MRN: [id number]  HPI  The patient is a 45 year old female who returns to pain management for further evaluation and treatment of pain involving the lower back lower extremity region especially the left lower extremity with prior trauma to the left lower extremity requiring surgical intervention. The patient has been with what is felt to be component of complex regional pain syndrome of the left lower extremity. On today's visit patient complains of pain involving the region of the back of the neck associated with headache as well. The patient also has been with MRI evidence of the cervical spine with evidence of abnormalities which been felt to be contributing to pain of the cervical region as well as concern regarding cervical radiculopathy associated with upper extremity pain paresthesias and weakness. The patient is undergone surgical evaluation and has been recommended to return to neurosurgeon for further assessment of pain of the cervical and upper extremity regions with weakness. We will also schedule patient for neurological evaluation for further assessment of headaches and proceed with lumbosacral selective nerve root block with pain of the lower back lower extremity region with concern regarding component of complex regional pain syndrome. The patient's symptomatology with more significant benefit from lumbosacral selective nerve root blocks compared to lumbar sympathetic blocks. The patient will continue Topamax and oxycodone and we'll consider additional modifications of treatment regimen pending follow-up evaluation. All agreed to suggested treatment plan    Review of Systems     Objective:   Physical Exam  There was tenderness to palpation of paraspinal musculature region the cervical region cervical facet region palpation which reproduces mild to moderate discomfort. There was unremarkable  Spurling's maneuver. The patient appeared to be with tenderness of the splenius capitis and occipitalis musculature regions palpation which reproduces moderate discomfort as well. The patient appeared to be with decreased grip strength right was decreased more than the left. Tinel's and Phalen's maneuver were without increase of pain of significant degree There was tenderness to palpation of the cervical facet cervical paraspinal muscular treat and thoracic facet thoracic paraspinal musculature region with no crepitus of the thoracic region noted. Palpation over the lumbar paraspinal must reason lumbar facet region was attends to palpation with lateral bending rotation extension and palpation the lumbar facets reproducing moderate discomfort. Straight leg raising was tolerates approximately 20 with decreased EHL strength on the left lower extremity compared to the right lower extremity which was with evidence of cyanotic hue. There was allodynia of the lower extremity noted on the left with no evidence of new lesions of the lower extremity noted. There was tenderness of the PSIS and PII S region a moderate degree and patient was without evidence of sensory deficit or dermatomal distribution of the lower extremities noted. There was mild tenderness to moderate tenderness of the greater trochanteric region and iliotibial band region. Abdomen nontender with no costovertebral tenderness noted      Assessment & Plan:     Complex regional pain syndrome of the left lower extremity (with recent trauma of left lower extremity)  Degenerative disc disease cervical spine Degenerative changes cervical spine most notable at the C5-6 level  Cervical radiculopathy  Sacroiliac joint dysfunction     PLAN   Continue present medication Topamax and oxycodone and apply Lidoderm patches as prescribed  Lumbar selective nerve root block to be performed at time return appointment  F/U PCP for evaliation  of BP left  lower extremity lesion headache and general medical condition as discussed.  F/U surgical evaluation as discussed. Consider neurosurgical reevaluation especially of left upper extremity weakness as we discussed.  F/U neurological evaluation. Ask nurses and secretary date of neurological evaluation of headaches  Wound Care clinic evaluation to be considered as discussed. We will avoid at this time  May consider radiofrequency rhizolysis or intraspinal procedures pending response to present treatment and F/U evaluation   Patient to call [facility] should patient have concerns prior to scheduled return appointment

## 2016-01-10 NOTE — Progress Notes (Signed)
Safety precautions to be maintained throughout the outpatient stay will include: orient to surroundings, keep bed in low position, maintain call bell within reach at all times, provide assistance with transfer out of bed and ambulation.  

## 2016-01-21 ENCOUNTER — Ambulatory Visit: Payer: Medicare Other | Attending: Pain Medicine | Admitting: Pain Medicine

## 2016-01-21 ENCOUNTER — Encounter: Payer: Self-pay | Admitting: Pain Medicine

## 2016-01-21 VITALS — BP 104/67 | HR 106 | Temp 97.7°F | Resp 16 | Ht 67.0 in | Wt 190.0 lb

## 2016-01-21 DIAGNOSIS — M5137 Other intervertebral disc degeneration, lumbosacral region: Secondary | ICD-10-CM

## 2016-01-21 DIAGNOSIS — M79606 Pain in leg, unspecified: Secondary | ICD-10-CM | POA: Diagnosis present

## 2016-01-21 DIAGNOSIS — M545 Low back pain: Secondary | ICD-10-CM | POA: Insufficient documentation

## 2016-01-21 DIAGNOSIS — M533 Sacrococcygeal disorders, not elsewhere classified: Secondary | ICD-10-CM

## 2016-01-21 DIAGNOSIS — G90522 Complex regional pain syndrome I of left lower limb: Secondary | ICD-10-CM

## 2016-01-21 DIAGNOSIS — M5010 Cervical disc disorder with radiculopathy, unspecified cervical region: Secondary | ICD-10-CM

## 2016-01-21 DIAGNOSIS — M47816 Spondylosis without myelopathy or radiculopathy, lumbar region: Secondary | ICD-10-CM

## 2016-01-21 DIAGNOSIS — M5030 Other cervical disc degeneration, unspecified cervical region: Secondary | ICD-10-CM

## 2016-01-21 DIAGNOSIS — M5416 Radiculopathy, lumbar region: Secondary | ICD-10-CM

## 2016-01-21 DIAGNOSIS — M5481 Occipital neuralgia: Secondary | ICD-10-CM

## 2016-01-21 DIAGNOSIS — G43119 Migraine with aura, intractable, without status migrainosus: Secondary | ICD-10-CM

## 2016-01-21 MED ORDER — CEFAZOLIN SODIUM 1 G IJ SOLR
INTRAMUSCULAR | Status: AC
  Administered 2016-01-21: 11:00:00
  Filled 2016-01-21: qty 10

## 2016-01-21 MED ORDER — TRIAMCINOLONE ACETONIDE 40 MG/ML IJ SUSP
INTRAMUSCULAR | Status: AC
  Administered 2016-01-21: 11:00:00
  Filled 2016-01-21: qty 1

## 2016-01-21 MED ORDER — ORPHENADRINE CITRATE 30 MG/ML IJ SOLN: 60.0000 mg | Freq: Once | INTRAMUSCULAR | Status: DC

## 2016-01-21 MED ORDER — FENTANYL CITRATE (PF) 100 MCG/2ML IJ SOLN: 100.0000 ug | Freq: Once | INTRAMUSCULAR | Status: DC

## 2016-01-21 MED ORDER — ORPHENADRINE CITRATE 30 MG/ML IJ SOLN
INTRAMUSCULAR | Status: AC
  Administered 2016-01-21: 11:00:00
  Filled 2016-01-21: qty 2

## 2016-01-21 MED ORDER — CEFUROXIME AXETIL 250 MG PO TABS: 250.0000 mg | ORAL_TABLET | Freq: Two times a day (BID) | ORAL | Status: DC

## 2016-01-21 MED ORDER — LACTATED RINGERS IV SOLN: 1000.0000 mL | INTRAVENOUS | Status: DC

## 2016-01-21 MED ORDER — CEFAZOLIN SODIUM 1-5 GM-% IV SOLN: 1.0000 g | Freq: Once | INTRAVENOUS | Status: DC

## 2016-01-21 MED ORDER — TRIAMCINOLONE ACETONIDE 40 MG/ML IJ SUSP: 40.0000 mg | Freq: Once | INTRAMUSCULAR | Status: DC

## 2016-01-21 MED ORDER — MIDAZOLAM HCL 5 MG/5ML IJ SOLN
INTRAMUSCULAR | Status: AC
  Administered 2016-01-21: 5 mg
  Filled 2016-01-21: qty 5

## 2016-01-21 MED ORDER — BUPIVACAINE HCL (PF) 0.25 % IJ SOLN
INTRAMUSCULAR | Status: AC
  Administered 2016-01-21: 11:00:00
  Filled 2016-01-21: qty 30

## 2016-01-21 MED ORDER — LIDOCAINE HCL (PF) 1 % IJ SOLN: 10.0000 mL | Freq: Once | INTRAMUSCULAR | Status: DC

## 2016-01-21 MED ORDER — MIDAZOLAM HCL 5 MG/5ML IJ SOLN: 5.0000 mg | Freq: Once | INTRAMUSCULAR | Status: DC

## 2016-01-21 MED ORDER — BUPIVACAINE HCL (PF) 0.25 % IJ SOLN: 30.0000 mL | Freq: Once | INTRAMUSCULAR | Status: DC

## 2016-01-21 MED ORDER — FENTANYL CITRATE (PF) 100 MCG/2ML IJ SOLN
INTRAMUSCULAR | Status: AC
  Administered 2016-01-21: 100 ug
  Filled 2016-01-21: qty 2

## 2016-01-21 NOTE — Progress Notes (Signed)
Subjective:    Patient ID: Jill Rivas, female    DOB: 1971/09/15, 45 y.o.   MRN: [id number]  HPI  PROCEDURE PERFORMED: Lumbosacral selective nerve root block   NOTE: The patient is a 45 y.o. female who returns to [facility] for further evaluation and treatment of pain involving the lumbar and lower extremity region. Studies Have revealed patient to be with degenerative changes of the lumbar spine. The patient is with history of trauma to the left lower extremity requiring surgical intervention. There is concern regarding component of patient's pain being due to complex regional pain syndrome predominantly. The patient has had more significant benefit following lumbosacral selective nerve root block compared to lumbar sympathetic block.. We will proceed with lumbosacral selective nerve root block in attempt to decrease severity of patient's symptoms, minimize progression of symptoms, and avoid the need for more involved treatment. The risks, benefits, and expectations of the procedure have been explained to the patient who was understanding and in agreement with suggested treatment plan. We will proceed with interventional treatment as discussed and as explained to the patient. The patient is understanding and in agreement with suggested treatment plan.   DESCRIPTION OF PROCEDURE: Lumbosacral selective nerve root block with IV Versed, IV fentanyl conscious sedation, EKG, blood pressure, pulse, capnography, and pulse oximetry monitoring. The procedure was performed with the patient in the prone position under fluoroscopic guidance. With the patient in the prone position, Betadine prep of proposed entry site was performed. Local anesthetic skin wheal of proposed needle entry site was prepared with 1.5% plain lidocaine with AP view of the lumbosacral spine.   PROCEDURE #1: Needle placement at the left L 2 vertebral body: A 22 -gauge needle was inserted at the inferior border of the  transverse process of the vertebral body with needle placed medial to the midline of the transverse process on AP view of the lumbosacral spine.   NEEDLE PLACEMENT AT  L3, L4, and L5  VERTEBRAL BODY LEVELS  Needle  placement was accomplished at L3, L4, and L5  vertebral body levels on the left side exactly as was accomplished at the L2  vertebral body level  and utilizing the same technique and under fluoroscopic guidance.    Needle placement was then verified on lateral view at all levels with needle tip documented to be in the posterior superior quadrant of the intervertebral foramen of  L 2, L3, L4, and L5.. Following negative aspiration for heme and CSF at each level, each level was injected with 3 mL of 0.25% bupivacaine with Kenalog.     The patient tolerated the procedure well. A total of 10 mg of Kenalog was utilized for the procedure.   PLAN:  1. Medications: Will continue presently prescribed medications Topamax and oxycodone 2. The patient is to undergo follow-up evaluation with PCP Dr. Lennox Grumbles for evaluation of blood pressure and general medical condition status post procedure performed on today's visit. 3. Surgical follow-up evaluation. Has been addressed 4. Neurological evaluation. 5. May consider radiofrequency procedures, implantation type procedures and other treatment pending response to treatment and follow-up evaluation. 6. The patient has been advise do adhere to proper body mechanics and avoid activities which may aggravate condition. 7. The patient has been advised to call the [facility] prior to scheduled return appointment should there be significant change in the patient's condition or should the patient have other concerns regarding condition prior to scheduled return appointment.   Review of Systems  Objective:   Physical Exam        Assessment & Plan:

## 2016-01-21 NOTE — Patient Instructions (Addendum)
PLAN   Continue present medication Topamax and oxycodone. Please obtain Ceftin antibiotic today and begin taking Ceftin antibiotic today as prescribed  F/U PCP Dr. Lennox Grumbles evaliation of  BP and general medical  condition..  F/U surgical evaluation as discussed. Follow up with neurosurgeon as discussed for further evaluation of pain of the neck and upper extremity regions associated with upper extremity weakness  F/U neurological evaluation. May consider pending follow-up evaluations  May consider radiofrequency rhizolysis or intraspinal procedures pending response to present treatment and F/U evaluation   Patient to call Pain Management Center should patient have concerns prior to scheduled return appointmentPain Management Discharge Instructions  General Discharge Instructions :  If you need to reach your doctor call: Monday-Friday 8:00 am - 4:00 pm at [phone] or toll free [phone].  After clinic hours [phone] to have operator reach doctor.  Bring all of your medication bottles to all your appointments in the pain clinic.  To cancel or reschedule your appointment with Pain Management please remember to call 24 hours in advance to avoid a fee.  Refer to the educational materials which you have been given on: General Risks, I had my Procedure. Discharge Instructions, Post Sedation.  Post Procedure Instructions:  The drugs you were given will stay in your system until tomorrow, so for the next 24 hours you should not drive, make any legal decisions or drink any alcoholic beverages.  You may eat anything you prefer, but it is better to start with liquids then soups and crackers, and gradually work up to solid foods.  Please notify your doctor immediately if you have any unusual bleeding, trouble breathing or pain that is not related to your normal pain.  Depending on the type of procedure that was done, some parts of your body may feel week and/or numb.  This usually  clears up by tonight or the next day.  Walk with the use of an assistive device or accompanied by an adult for the 24 hours.  You may use ice on the affected area for the first 24 hours.  Put ice in a Ziploc bag and cover with a towel and place against area 15 minutes on 15 minutes off.  You may switch to heat after 24 hours.GENERAL RISKS AND COMPLICATIONS  What are the risk, side effects and possible complications? Generally speaking, most procedures are safe.  However, with any procedure there are risks, side effects, and the possibility of complications.  The risks and complications are dependent upon the sites that are lesioned, or the type of nerve block to be performed.  The closer the procedure is to the spine, the more serious the risks are.  Great care is taken when placing the radio frequency needles, block needles or lesioning probes, but sometimes complications can occur. 1. Infection: Any time there is an injection through the skin, there is a risk of infection.  This is why sterile conditions are used for these blocks.  There are four possible types of infection. 1. Localized skin infection. 2. Central Nervous System Infection-This can be in the form of Meningitis, which can be deadly. 3. Epidural Infections-This can be in the form of an epidural abscess, which can cause pressure inside of the spine, causing compression of the spinal cord with subsequent paralysis. This would require an emergency surgery to decompress, and there are no guarantees that the patient would recover from the paralysis. 4. Discitis-This is an infection of the intervertebral discs.  It occurs in about 1% of  discography procedures.  It is difficult to treat and it may lead to surgery.        2. Pain: the needles have to go through skin and soft tissues, will cause soreness.       3. Damage to internal structures:  The nerves to be lesioned may be near blood vessels or    other nerves which can be potentially  damaged.       4. Bleeding: Bleeding is more common if the patient is taking blood thinners such as  aspirin, Coumadin, Ticiid, Plavix, etc., or if he/she have some genetic predisposition  such as hemophilia. Bleeding into the spinal canal can cause compression of the spinal  cord with subsequent paralysis.  This would require an emergency surgery to  decompress and there are no guarantees that the patient would recover from the  paralysis.       5. Pneumothorax:  Puncturing of a lung is a possibility, every time a needle is introduced in  the area of the chest or upper back.  Pneumothorax refers to free air around the  collapsed lung(s), inside of the thoracic cavity (chest cavity).  Another two possible  complications related to a similar event would include: Hemothorax and Chylothorax.   These are variations of the Pneumothorax, where instead of air around the collapsed  lung(s), you may have blood or chyle, respectively.       6. Spinal headaches: They may occur with any procedures in the area of the spine.       7. Persistent CSF (Cerebro-Spinal Fluid) leakage: This is a rare problem, but may occur  with prolonged intrathecal or epidural catheters either due to the formation of a fistulous  track or a dural tear.       8. Nerve damage: By working so close to the spinal cord, there is always a possibility of  nerve damage, which could be as serious as a permanent spinal cord injury with  paralysis.       9. Death:  Although rare, severe deadly allergic reactions known as "Anaphylactic  reaction" can occur to any of the medications used.      10. Worsening of the symptoms:  We can always make thing worse.  What are the chances of something like this happening? Chances of any of this occuring are extremely low.  By statistics, you have more of a chance of getting killed in a motor vehicle accident: while driving to the hospital than any of the above occurring .  Nevertheless, you should be aware that  they are possibilities.  In general, it is similar to taking a shower.  Everybody knows that you can slip, hit your head and get killed.  Does that mean that you should not shower again?  Nevertheless always keep in mind that statistics do not mean anything if you happen to be on the wrong side of them.  Even if a procedure has a 1 (one) in a 1,000,000 (million) chance of going wrong, it you happen to be that one..Also, keep in mind that by statistics, you have more of a chance of having something go wrong when taking medications.  Who should not have this procedure? If you are on a blood thinning medication (e.g. Coumadin, Plavix, see list of "Blood Thinners"), or if you have an active infection going on, you should not have the procedure.  If you are taking any blood thinners, please inform your physician.  How should I prepare for  this procedure?  Do not eat or drink anything at least six hours prior to the procedure.  Bring a driver with you .  It cannot be a taxi.  Come accompanied by an adult that can drive you back, and that is strong enough to help you if your legs get weak or numb from the local anesthetic.  Take all of your medicines the morning of the procedure with just enough water to swallow them.  If you have diabetes, make sure that you are scheduled to have your procedure done first thing in the morning, whenever possible.  If you have diabetes, take only half of your insulin dose and notify our nurse that you have done so as soon as you arrive at the clinic.  If you are diabetic, but only take blood sugar pills (oral hypoglycemic), then do not take them on the morning of your procedure.  You may take them after you have had the procedure.  Do not take aspirin or any aspirin-containing medications, at least eleven (11) days prior to the procedure.  They may prolong bleeding.  Wear loose fitting clothing that may be easy to take off and that you would not mind if it got stained  with Betadine or blood.  Do not wear any jewelry or perfume  Remove any nail coloring.  It will interfere with some of our monitoring equipment.  NOTE: Remember that this is not meant to be interpreted as a complete list of all possible complications.  Unforeseen problems may occur.  BLOOD THINNERS The following drugs contain aspirin or other products, which can cause increased bleeding during surgery and should not be taken for 2 weeks prior to and 1 week after surgery.  If you should need take something for relief of minor pain, you may take acetaminophen which is found in Tylenol,m Datril, Anacin-3 and Panadol. It is not blood thinner. The products listed below are.  Do not take any of the products listed below in addition to any listed on your instruction sheet.  A.P.C or A.P.C with Codeine Codeine Phosphate Capsules #3 Ibuprofen Ridaura  ABC compound Congesprin Imuran rimadil  Advil Cope Indocin Robaxisal  Alka-Seltzer Effervescent Pain Reliever and Antacid Coricidin or Coricidin-D  Indomethacin Rufen  Alka-Seltzer plus Cold Medicine Cosprin Ketoprofen S-A-C Tablets  Anacin Analgesic Tablets or Capsules Coumadin Korlgesic Salflex  Anacin Extra Strength Analgesic tablets or capsules CP-2 Tablets Lanoril Salicylate  Anaprox Cuprimine Capsules Levenox Salocol  Anexsia-D Dalteparin Magan Salsalate  Anodynos Darvon compound Magnesium Salicylate Sine-off  Ansaid Dasin Capsules Magsal Sodium Salicylate  Anturane Depen Capsules Marnal Soma  APF Arthritis pain formula Dewitt's Pills Measurin Stanback  Argesic Dia-Gesic Meclofenamic Sulfinpyrazone  Arthritis Bayer Timed Release Aspirin Diclofenac Meclomen Sulindac  Arthritis pain formula Anacin Dicumarol Medipren Supac  Analgesic (Safety coated) Arthralgen Diffunasal Mefanamic Suprofen  Arthritis Strength Bufferin Dihydrocodeine Mepro Compound Suprol  Arthropan liquid Dopirydamole Methcarbomol with Aspirin Synalgos  ASA tablets/Enseals  Disalcid Micrainin Tagament  Ascriptin Doan's Midol Talwin  Ascriptin A/D Dolene Mobidin Tanderil  Ascriptin Extra Strength Dolobid Moblgesic Ticlid  Ascriptin with Codeine Doloprin or Doloprin with Codeine Momentum Tolectin  Asperbuf Duoprin Mono-gesic Trendar  Aspergum Duradyne Motrin or Motrin IB Triminicin  Aspirin plain, buffered or enteric coated Durasal Myochrisine Trigesic  Aspirin Suppositories Easprin Nalfon Trillsate  Aspirin with Codeine Ecotrin Regular or Extra Strength Naprosyn Uracel  Atromid-S Efficin Naproxen Ursinus  Auranofin Capsules Elmiron Neocylate Vanquish  Axotal Emagrin Norgesic Verin  Azathioprine Empirin or Empirin with Codeine  Normiflo Vitamin E  Azolid Emprazil Nuprin Voltaren  Bayer Aspirin plain, buffered or children's or timed BC Tablets or powders Encaprin Orgaran Warfarin Sodium  Buff-a-Comp Enoxaparin Orudis Zorpin  Buff-a-Comp with Codeine Equegesic Os-Cal-Gesic   Buffaprin Excedrin plain, buffered or Extra Strength Oxalid   Bufferin Arthritis Strength Feldene Oxphenbutazone   Bufferin plain or Extra Strength Feldene Capsules Oxycodone with Aspirin   Bufferin with Codeine Fenoprofen Fenoprofen Pabalate or Pabalate-SF   Buffets II Flogesic Panagesic   Buffinol plain or Extra Strength Florinal or Florinal with Codeine Panwarfarin   Buf-Tabs Flurbiprofen Penicillamine   Butalbital Compound Four-way cold tablets Penicillin   Butazolidin Fragmin Pepto-Bismol   Carbenicillin Geminisyn Percodan   Carna Arthritis Reliever Geopen Persantine   Carprofen Gold's salt Persistin   Chloramphenicol Goody's Phenylbutazone   Chloromycetin Haltrain Piroxlcam   Clmetidine heparin Plaquenil   Cllnoril Hyco-pap Ponstel   Clofibrate Hydroxy chloroquine Propoxyphen         Before stopping any of these medications, be sure to consult the physician who ordered them.  Some, such as Coumadin (Warfarin) are ordered to prevent or treat serious conditions such as "deep  thrombosis", "pumonary embolisms", and other heart problems.  The amount of time that you may need off of the medication may also vary with the medication and the reason for which you were taking it.  If you are taking any of these medications, please make sure you notify your pain physician before you undergo any procedures.

## 2016-01-21 NOTE — Progress Notes (Signed)
Patient here today for procedure d/t Left foot pain that radiates up leg and across her lower back.  Safety precautions to be maintained throughout the outpatient stay will include: orient to surroundings, keep bed in low position, maintain call bell within reach at all times, provide assistance with transfer out of bed and ambulation.

## 2016-01-22 ENCOUNTER — Telehealth: Payer: Self-pay

## 2016-01-22 NOTE — Telephone Encounter (Signed)
Post procedure phone call.  Left message.  

## 2016-02-06 ENCOUNTER — Ambulatory Visit: Payer: Medicare Other | Attending: Pain Medicine | Admitting: Pain Medicine

## 2016-02-06 ENCOUNTER — Encounter: Payer: Self-pay | Admitting: Pain Medicine

## 2016-02-06 VITALS — BP 109/82 | HR 90 | Temp 98.2°F | Resp 16 | Ht 67.0 in | Wt 183.0 lb

## 2016-02-06 DIAGNOSIS — M533 Sacrococcygeal disorders, not elsewhere classified: Secondary | ICD-10-CM | POA: Insufficient documentation

## 2016-02-06 DIAGNOSIS — R202 Paresthesia of skin: Secondary | ICD-10-CM | POA: Diagnosis not present

## 2016-02-06 DIAGNOSIS — M542 Cervicalgia: Secondary | ICD-10-CM | POA: Diagnosis present

## 2016-02-06 DIAGNOSIS — M5010 Cervical disc disorder with radiculopathy, unspecified cervical region: Secondary | ICD-10-CM | POA: Insufficient documentation

## 2016-02-06 DIAGNOSIS — G90522 Complex regional pain syndrome I of left lower limb: Secondary | ICD-10-CM | POA: Diagnosis not present

## 2016-02-06 DIAGNOSIS — M47816 Spondylosis without myelopathy or radiculopathy, lumbar region: Secondary | ICD-10-CM

## 2016-02-06 DIAGNOSIS — M5030 Other cervical disc degeneration, unspecified cervical region: Secondary | ICD-10-CM

## 2016-02-06 DIAGNOSIS — M79606 Pain in leg, unspecified: Secondary | ICD-10-CM | POA: Diagnosis present

## 2016-02-06 DIAGNOSIS — G43119 Migraine with aura, intractable, without status migrainosus: Secondary | ICD-10-CM

## 2016-02-06 DIAGNOSIS — M47892 Other spondylosis, cervical region: Secondary | ICD-10-CM | POA: Diagnosis not present

## 2016-02-06 DIAGNOSIS — M5481 Occipital neuralgia: Secondary | ICD-10-CM

## 2016-02-06 DIAGNOSIS — M5416 Radiculopathy, lumbar region: Secondary | ICD-10-CM

## 2016-02-06 DIAGNOSIS — M5137 Other intervertebral disc degeneration, lumbosacral region: Secondary | ICD-10-CM

## 2016-02-06 DIAGNOSIS — M545 Low back pain: Secondary | ICD-10-CM | POA: Diagnosis present

## 2016-02-06 MED ORDER — OXYCODONE HCL 10 MG PO TABS: ORAL_TABLET | ORAL | Status: DC

## 2016-02-06 MED ORDER — TOPIRAMATE 50 MG PO TABS: ORAL_TABLET | ORAL | Status: DC

## 2016-02-06 NOTE — Progress Notes (Signed)
Subjective:    Patient ID: Jill Rivas, female    DOB: 1971/08/17, 45 y.o.   MRN: [id number]  HPI  The patient is a 45 year old female who returns to pain management for further evaluation and treatment of pain involving the lower back and lower extremity region as well as the neck and upper extremity region. The patient is with history of trauma to the lower extremity requiring surgical intervention and there has been concern regarding component of complex regional pain syndrome contributing to lower extremity pain involving the left lower extremity. The patient is with pain of the cervical region and upper extremity region with left upper extremity weakness. There has been concern regarding intraspinal mask treatment of patient's symptomatology and began on today's visit we recommended patient undergo neurosurgical reevaluation for further assessment of condition and for consideration for surgical intervention as well as other treatment. We will also discussed interventional treatment of the cervical region for treatment of pain of the cervical region associated with upper extremity pain paresthesias and weakness as well. The patient has been cautioned regarding activities to avoid aggravation of symptoms involving both upper extremities and lower extremities and the cervical and lumbar regions. We will continue present medications including Topamax and oxycodone at this time and we will remain available to consider modification of treatment regimen pending follow-up evaluation. The patient agreed to suggested treatment plan.  Review of Systems     Objective:   Physical Exam  There was tenderness to palpation of paraspinal muscular region cervical region cervical facet region a mild to moderate degree with tenderness of the thoracic thoracic facet thoracic paraspinal musculature region of mild to moderate degree. No crepitus of the thoracic region was noted. There was mild to moderate  tenderness of the splenius capitis and occipitalis musculature regions. Palpation of the acromioclavicular and glenohumeral joint regions reproduce mild discomfort. There was unremarkable Spurling's maneuver. There was decreased grip strength on the left compared to the right. Tinel and Phalen's maneuver were without increased pain of significant degree. Palpation over the lumbar paraspinal must reason lumbar facet region was with moderate tends to palpation with lateral bending rotation extension and palpation of the lumbar facets reproducing mild to moderate discomfort. Palpation over the region of the PSIS and PII S regions reproduces minimal discomfort as well as mild to minimal tenderness of the greater trochanteric region iliotibial band region. The left lower extremity was with slight cyanotic hue. There was increased sensitivity to touch of the left lower extremity with decreased range of motion of the left lower extremity with straight leg raising tolerates approximately 30 without increased pain with dorsiflexion noted. There was slight swelling of the left lower extremity compared to the right lower extremity. No sensory deficit or dermatomal distribution of the left and right lower extremity is noted. Was area of allodynia of the left lower extremity in the region of the medial malleolus and distal to the continuing to the toes. There was negative Homans. 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 Lennox Grumbles for evaliation of BP left lower  extremity lesion headache  and general medical condition as discussed.  F/U surgical evaluation as discussed. The patient was advised to proceed with neurosurgical reevaluation especially of left upper extremity weakness and pain of the cervical region with cervical MRI findings as mentioned as we discussed again today  F/U neurological evaluation as discussed  Wound Care clinic evaluation to be considered as discussed. We will avoid at this time  May consider radiofrequency rhizolysis or intraspinal procedures pending response to present treatment and F/U evaluation   Patient to call Pain Management Center should patient have concerns prior to scheduled return appointment

## 2016-02-06 NOTE — Progress Notes (Signed)
Patient here for medication management Safety precautions to be maintained throughout the outpatient stay will include: orient to surroundings, keep bed in low position, maintain call bell within reach at all times, provide assistance with transfer out of bed and ambulation.  

## 2016-02-06 NOTE — Patient Instructions (Addendum)
PLAN   Continue present medication Topamax and oxycodone and apply Lidoderm patches as prescribed  F/U PCP for evaliation of BP left lower extremity lesion headache and general medical condition as discussed.  F/U surgical evaluation as discussed. Consider neurosurgical reevaluation especially of left upper extremity weakness as we discussed again today  F/U neurological evaluation as discussed  Wound Care clinic evaluation to be considered as discussed. We will avoid at this time  May consider radiofrequency rhizolysis or intraspinal procedures pending response to present treatment and F/U evaluation   Patient to call Pain Management Center should patient have concerns prior to scheduled return appointment

## 2016-02-13 ENCOUNTER — Telehealth: Payer: Self-pay | Admitting: *Deleted

## 2016-02-13 NOTE — Telephone Encounter (Signed)
Pt called stating that [place] neuro Dr. Frankey Poot needs the office notes from Dr. Primus Bravo faxed over to there facility...Marland KitchenTD

## 2016-02-13 NOTE — Telephone Encounter (Signed)
Sent to [place].

## 2016-02-13 NOTE — Telephone Encounter (Signed)
Called Jill Rivas to let her know the message to send notes has to forwarded to appropriate person. Check with Dr Mearl Latin office to make sure they have them prior to next appt on 02/25/16

## 2016-02-14 LAB — TOXASSURE SELECT 13 (MW), URINE

## 2016-02-15 NOTE — Progress Notes (Signed)
Quick Note:  Reviewed. ______ 

## 2016-03-06 ENCOUNTER — Encounter: Payer: Medicare Other | Admitting: Pain Medicine

## 2016-03-13 ENCOUNTER — Ambulatory Visit: Payer: Medicare Other | Attending: Pain Medicine | Admitting: Pain Medicine

## 2016-03-13 ENCOUNTER — Encounter: Payer: Self-pay | Admitting: Pain Medicine

## 2016-03-13 VITALS — BP 146/91 | HR 99 | Temp 97.7°F | Resp 16

## 2016-03-13 DIAGNOSIS — M47816 Spondylosis without myelopathy or radiculopathy, lumbar region: Secondary | ICD-10-CM

## 2016-03-13 DIAGNOSIS — M533 Sacrococcygeal disorders, not elsewhere classified: Secondary | ICD-10-CM

## 2016-03-13 DIAGNOSIS — M5137 Other intervertebral disc degeneration, lumbosacral region: Secondary | ICD-10-CM

## 2016-03-13 DIAGNOSIS — M5030 Other cervical disc degeneration, unspecified cervical region: Secondary | ICD-10-CM

## 2016-03-13 DIAGNOSIS — M5010 Cervical disc disorder with radiculopathy, unspecified cervical region: Secondary | ICD-10-CM

## 2016-03-13 DIAGNOSIS — M47892 Other spondylosis, cervical region: Secondary | ICD-10-CM | POA: Insufficient documentation

## 2016-03-13 DIAGNOSIS — M542 Cervicalgia: Secondary | ICD-10-CM | POA: Diagnosis present

## 2016-03-13 DIAGNOSIS — G90522 Complex regional pain syndrome I of left lower limb: Secondary | ICD-10-CM

## 2016-03-13 DIAGNOSIS — M51379 Other intervertebral disc degeneration, lumbosacral region without mention of lumbar back pain or lower extremity pain: Secondary | ICD-10-CM

## 2016-03-13 DIAGNOSIS — G43119 Migraine with aura, intractable, without status migrainosus: Secondary | ICD-10-CM

## 2016-03-13 DIAGNOSIS — M5481 Occipital neuralgia: Secondary | ICD-10-CM

## 2016-03-13 DIAGNOSIS — M545 Low back pain: Secondary | ICD-10-CM | POA: Diagnosis present

## 2016-03-13 DIAGNOSIS — M5416 Radiculopathy, lumbar region: Secondary | ICD-10-CM

## 2016-03-13 MED ORDER — OXYCODONE HCL 10 MG PO TABS: ORAL_TABLET | ORAL | Status: DC

## 2016-03-13 NOTE — Progress Notes (Signed)
Subjective:    Patient ID: Jill Rivas, female    DOB: Mar 03, 1971, 45 y.o.   MRN: [id number]  HPI  The patient is a 45 year old female who returns to pain management for further evaluation and treatment of pain involving the region of the lower back lower extremity region especially the left lower extremity region with pain occurring the neck and upper extremity region associated with upper extremity weakness. We discussed patient's need to proceed with neurosurgical evaluation of pain of the cervical and upper extremity region. We also discussed the pain of the lower extremity region on the left which is been felt to be component of complex regional pain syndrome. The patient will continue medications consisting of Topamax and oxycodone. We will proceed with interventional treatment at time return appointment with pain involving the left lower extremity region and will performed lumbosacral selective nerve root block which has been more affected than lumbar sympathetic block. There is been concern regarding patient's pain being due to sympathetic component as well as non-sympathetic component. Is also been concern regarding somatic component of patient's pain including intraspinal abnormalities of the lumbosacral region. The patient's symptomatology. We will proceed with lumbosacral selective nerve root block at time return appointment as discussed. All agreed to suggested treatment plan.  Review of Systems     Objective:   Physical Exam   Palpation of the splenius capitis and occipitalis region reproduced mild to moderate discomfort. There was limited range of motion of the cervical spine without definite positive abnormalities noted with Spurling's maneuver. There was decreased grip strength noted on the right as well as on the left. Palpation over the region of the acromioclavicular and glenohumeral joint regions reproduce mild discomfort. There was no significant increase of pain with  Tinel and Phalen's maneuver. Palpation of the thoracic region thoracic facet region was with evidence of muscle spasm with no crepitus of the thoracic region noted. Palpation over the region of lumbar region lumbar facet region was attends to palpation of moderate degree with lateral bending rotation extension and palpation of the lumbar facets reproducing moderate discomfort. Straight leg raise was tolerates approximately 20. The left lower extremity was attends to palpation with slight cyanotic hue involving the region of the left foot. There was allodynia of the left foot in the region of the heel especially. No newly formed lesions of the lower extremity were noted. There was negative Homans. Abdomen nontender with no costovertebral tenderness noted.     Assessment & Plan:      Complex regional pain syndrome of the left lower extremity (with recent trauma of left lower extremity)  Degenerative disc disease cervical spine Degenerative changes cervical spine most notable at the C5-6 level  Cervical radiculopathy  Sacroiliac joint dysfunction     PLAN   Continue present medication Topamax and oxycodone and apply Lidoderm patches as prescribed  Lumbosacral selective nerve root block to be performed at time of return appointment  F/U PCP for evaliation of BP  and general medical condition as discussed.  F/U surgical evaluation as discussed. Proceed with neurosurgical reevaluation especially for pain of the cervical region and pain of left upper extremity weakness as we discussed again today  F/U neurological evaluation as discussed  Wound Care clinic evaluation to be considered as needed. We will avoid at this time  May consider radiofrequency rhizolysis or intraspinal procedures pending response to present treatment and F/U evaluation   Patient to call Pain Management Center should patient have concerns prior to  scheduled return appointment

## 2016-03-13 NOTE — Patient Instructions (Addendum)
PLAN   Continue present medication Topamax and oxycodone and apply Lidoderm patches as prescribed  Lumbosacral selective nerve root block to be performed at time of return appointment  F/U PCP for evaliation of BP  and general medical condition as discussed.  F/U surgical evaluation as discussed. Proceed with neurosurgical reevaluation especially pain of the cervical region and of left upper extremity weakness as we discussed again today  F/U neurological evaluation as discussed  Wound Care clinic evaluation to be considered as discussed. We will avoid at this time  May consider radiofrequency rhizolysis or intraspinal procedures pending response to present treatment and F/U evaluation   Patient to call [facility] Management Center should patient have concerns prior to scheduled return appointmentSelective Nerve Root Block Patient Information  Description: Specific nerve roots exit the spinal canal and these nerves can be compressed and inflamed by a bulging disc and bone spurs.  By injecting steroids on the nerve root, we can potentially decrease the inflammation surrounding these nerves, which often leads to decreased pain.  Also, by injecting local anesthesia on the nerve root, this can provide [place] helpful information to give to your referring doctor if it decreases your pain.  Selective nerve root blocks can be done along the spine from the neck to the low back depending on the location of your pain.   After numbing the skin with local anesthesia, a small needle is passed to the nerve root and the position of the needle is verified using x-ray pictures.  After the needle is in correct position, we then deposit the medication.  You may experience a pressure sensation while this is being done.  The entire block usually lasts less than 15 minutes.  Conditions that may be treated with selective nerve root blocks:  Low back and leg pain  Spinal stenosis  Diagnostic block prior to potential  surgery  Neck and arm pain  Post laminectomy syndrome  Preparation for the injection:  1. Do not eat any solid food or dairy products within 8 hours of your appointment. 2. You may drink clear liquids up to 3 hours before an appointment.  Clear liquids include water, black coffee, juice or soda.  No milk or cream please. 3. You may take your regular medications, including pain medications, with a sip of water before your appointment.  Diabetics should hold regular insulin (if taken separately) and take 1/2 normal NPH dose the morning of the procedure.  Carry some sugar containing items with you to your appointment. 4. A driver must accompany you and be prepared to drive you home after your procedure. 5. Bring all your current medications with you. 6. An IV may be inserted and sedation may be given at the discretion of the physician. 7. A blood pressure cuff, EKG, and other monitors will often be applied during the procedure.  Some patients may need to have extra oxygen administered for a short period. 8. You will be asked to provide medical information, including allergies, prior to the procedure.  We must know immediately if you are taking blood  Thinners (like Coumadin) or if you are allergic to IV iodine contrast (dye).  Possible side-effects: All are usually temporary  Bleeding from needle site  Light headedness  Numbness and tingling  Decreased blood pressure  Weakness in arms/legs  Pressure sensation in back/neck  Pain at injection site (several days)  Possible complications: All are extremely rare  Infection  Nerve injury  Spinal headache (a headache wore with upright position)  Call if you experience:  Fever/chills associated with headache or increased back/neck pain  Headache worsened by an upright position  New onset weakness or numbness of an extremity below the injection site  Hives or difficulty breathing (go to the emergency room)  Inflammation or  drainage at the injection site(s)  Severe back/neck pain greater than usual  New symptoms which are concerning to you  Please note:  Although the local anesthetic injected can often make your back or neck feel good for several hours after the injection the pain will likely return.  It takes 3-5 days for steroids to work on the nerve root. You may not notice any pain relief for at least one week.  If effective, we will often do a series of 3 injections spaced 3-6 weeks apart to maximally decrease your pain.    If you have any questions, please call [phone] [facility] ClinicGENERAL RISKS AND COMPLICATIONS  What are the risk, side effects and possible complications? Generally speaking, most procedures are safe.  However, with any procedure there are risks, side effects, and the possibility of complications.  The risks and complications are dependent upon the sites that are lesioned, or the type of nerve block to be performed.  The closer the procedure is to the spine, the more serious the risks are.  Great care is taken when placing the radio frequency needles, block needles or lesioning probes, but sometimes complications can occur. 1. Infection: Any time there is an injection through the skin, there is a risk of infection.  This is why sterile conditions are used for these blocks.  There are four possible types of infection. 1. Localized skin infection. 2. Central Nervous System Infection-This can be in the form of Meningitis, which can be deadly. 3. Epidural Infections-This can be in the form of an epidural abscess, which can cause pressure inside of the spine, causing compression of the spinal cord with subsequent paralysis. This would require an emergency surgery to decompress, and there are no guarantees that the patient would recover from the paralysis. 4. Discitis-This is an infection of the intervertebral discs.  It occurs in about 1% of discography  procedures.  It is difficult to treat and it may lead to surgery.        2. Pain: the needles have to go through skin and soft tissues, will cause soreness.       3. Damage to internal structures:  The nerves to be lesioned may be near blood vessels or    other nerves which can be potentially damaged.       4. Bleeding: Bleeding is more common if the patient is taking blood thinners such as  aspirin, Coumadin, Ticiid, Plavix, etc., or if he/she have some genetic predisposition  such as hemophilia. Bleeding into the spinal canal can cause compression of the spinal  cord with subsequent paralysis.  This would require an emergency surgery to  decompress and there are no guarantees that the patient would recover from the  paralysis.       5. Pneumothorax:  Puncturing of a lung is a possibility, every time a needle is introduced in  the area of the chest or upper back.  Pneumothorax refers to free air around the  collapsed lung(s), inside of the thoracic cavity (chest cavity).  Another two possible  complications related to a similar event would include: Hemothorax and Chylothorax.   These are variations of the Pneumothorax, where instead of air around  the collapsed  lung(s), you may have blood or chyle, respectively.       6. Spinal headaches: They may occur with any procedures in the area of the spine.       7. Persistent CSF (Cerebro-Spinal Fluid) leakage: This is a rare problem, but may occur  with prolonged intrathecal or epidural catheters either due to the formation of a fistulous  track or a dural tear.       8. Nerve damage: By working so close to the spinal cord, there is always a possibility of  nerve damage, which could be as serious as a permanent spinal cord injury with  paralysis.       9. Death:  Although rare, severe deadly allergic reactions known as "Anaphylactic  reaction" can occur to any of the medications used.      10. Worsening of the symptoms:  We can always make thing worse.  What  are the chances of something like this happening? Chances of any of this occuring are extremely low.  By statistics, you have more of a chance of getting killed in a motor vehicle accident: while driving to the hospital than any of the above occurring .  Nevertheless, you should be aware that they are possibilities.  In general, it is similar to taking a shower.  Everybody knows that you can slip, hit your head and get killed.  Does that mean that you should not shower again?  Nevertheless always keep in mind that statistics do not mean anything if you happen to be on the wrong side of them.  Even if a procedure has a 1 (one) in a 1,000,000 (million) chance of going wrong, it you happen to be that one..Also, keep in mind that by statistics, you have more of a chance of having something go wrong when taking medications.  Who should not have this procedure? If you are on a blood thinning medication (e.g. Coumadin, Plavix, see list of "Blood Thinners"), or if you have an active infection going on, you should not have the procedure.  If you are taking any blood thinners, please inform your physician.  How should I prepare for this procedure?  Do not eat or drink anything at least six hours prior to the procedure.  Bring a driver with you .  It cannot be a taxi.  Come accompanied by an adult that can drive you back, and that is strong enough to help you if your legs get weak or numb from the local anesthetic.  Take all of your medicines the morning of the procedure with just enough water to swallow them.  If you have diabetes, make sure that you are scheduled to have your procedure done first thing in the morning, whenever possible.  If you have diabetes, take only half of your insulin dose and notify our nurse that you have done so as soon as you arrive at the clinic.  If you are diabetic, but only take blood sugar pills (oral hypoglycemic), then do not take them on the morning of your procedure.   You may take them after you have had the procedure.  Do not take aspirin or any aspirin-containing medications, at least eleven (11) days prior to the procedure.  They may prolong bleeding.  Wear loose fitting clothing that may be easy to take off and that you would not mind if it got stained with Betadine or blood.  Do not wear any jewelry or perfume  Remove any nail  coloring.  It will interfere with some of our monitoring equipment.  NOTE: Remember that this is not meant to be interpreted as a complete list of all possible complications.  Unforeseen problems may occur.  BLOOD THINNERS The following drugs contain aspirin or other products, which can cause increased bleeding during surgery and should not be taken for 2 weeks prior to and 1 week after surgery.  If you should need take something for relief of minor pain, you may take acetaminophen which is found in Tylenol,m Datril, Anacin-3 and Panadol. It is not blood thinner. The products listed below are.  Do not take any of the products listed below in addition to any listed on your instruction sheet.  A.P.C or A.P.C with Codeine Codeine Phosphate Capsules #3 Ibuprofen Ridaura  ABC compound Congesprin Imuran rimadil  Advil Cope Indocin Robaxisal  Alka-Seltzer Effervescent Pain Reliever and Antacid Coricidin or Coricidin-D  Indomethacin Rufen  Alka-Seltzer plus Cold Medicine Cosprin Ketoprofen S-A-C Tablets  Anacin Analgesic Tablets or Capsules Coumadin Korlgesic Salflex  Anacin Extra Strength Analgesic tablets or capsules CP-2 Tablets Lanoril Salicylate  Anaprox Cuprimine Capsules Levenox Salocol  Anexsia-D Dalteparin Magan Salsalate  Anodynos Darvon compound Magnesium Salicylate Sine-off  Ansaid Dasin Capsules Magsal Sodium Salicylate  Anturane Depen Capsules Marnal Soma  APF Arthritis pain formula Dewitt's Pills Measurin Stanback  Argesic Dia-Gesic Meclofenamic Sulfinpyrazone  Arthritis Bayer Timed Release Aspirin Diclofenac  Meclomen Sulindac  Arthritis pain formula Anacin Dicumarol Medipren Supac  Analgesic (Safety coated) Arthralgen Diffunasal Mefanamic Suprofen  Arthritis Strength Bufferin Dihydrocodeine Mepro Compound Suprol  Arthropan liquid Dopirydamole Methcarbomol with Aspirin Synalgos  ASA tablets/Enseals Disalcid Micrainin Tagament  Ascriptin Doan's Midol Talwin  Ascriptin A/D Dolene Mobidin Tanderil  Ascriptin Extra Strength Dolobid Moblgesic Ticlid  Ascriptin with Codeine Doloprin or Doloprin with Codeine Momentum Tolectin  Asperbuf Duoprin Mono-gesic Trendar  Aspergum Duradyne Motrin or Motrin IB Triminicin  Aspirin plain, buffered or enteric coated Durasal Myochrisine Trigesic  Aspirin Suppositories Easprin Nalfon Trillsate  Aspirin with Codeine Ecotrin Regular or Extra Strength Naprosyn Uracel  Atromid-S Efficin Naproxen Ursinus  Auranofin Capsules Elmiron Neocylate Vanquish  Axotal Emagrin Norgesic Verin  Azathioprine Empirin or Empirin with Codeine Normiflo Vitamin E  Azolid Emprazil Nuprin Voltaren  Bayer Aspirin plain, buffered or children's or timed BC Tablets or powders Encaprin Orgaran Warfarin Sodium  Buff-a-Comp Enoxaparin Orudis Zorpin  Buff-a-Comp with Codeine Equegesic Os-Cal-Gesic   Buffaprin Excedrin plain, buffered or Extra Strength Oxalid   Bufferin Arthritis Strength Feldene Oxphenbutazone   Bufferin plain or Extra Strength Feldene Capsules Oxycodone with Aspirin   Bufferin with Codeine Fenoprofen Fenoprofen Pabalate or Pabalate-SF   Buffets II Flogesic Panagesic   Buffinol plain or Extra Strength Florinal or Florinal with Codeine Panwarfarin   Buf-Tabs Flurbiprofen Penicillamine   Butalbital Compound Four-way cold tablets Penicillin   Butazolidin Fragmin Pepto-Bismol   Carbenicillin Geminisyn Percodan   Carna Arthritis Reliever Geopen Persantine   Carprofen Gold's salt Persistin   Chloramphenicol Goody's Phenylbutazone   Chloromycetin Haltrain Piroxlcam   Clmetidine  heparin Plaquenil   Cllnoril Hyco-pap Ponstel   Clofibrate Hydroxy chloroquine Propoxyphen         Before stopping any of these medications, be sure to consult the physician who ordered them.  Some, such as Coumadin (Warfarin) are ordered to prevent or treat serious conditions such as "deep thrombosis", "pumonary embolisms", and other heart problems.  The amount of time that you may need off of the medication may also vary with the medication and the reason for  which you were taking it.  If you are taking any of these medications, please make sure you notify your pain physician before you undergo any procedures.

## 2016-03-13 NOTE — Progress Notes (Signed)
Safety precautions to be maintained throughout the outpatient stay will include: orient to surroundings, keep bed in low position, maintain call bell within reach at all times, provide assistance with transfer out of bed and ambulation.  

## 2016-04-02 ENCOUNTER — Encounter: Payer: Self-pay | Admitting: Pain Medicine

## 2016-04-02 ENCOUNTER — Ambulatory Visit: Payer: Medicare Other | Attending: Pain Medicine | Admitting: Pain Medicine

## 2016-04-02 DIAGNOSIS — M5481 Occipital neuralgia: Secondary | ICD-10-CM

## 2016-04-02 DIAGNOSIS — M5416 Radiculopathy, lumbar region: Secondary | ICD-10-CM

## 2016-04-02 DIAGNOSIS — Z9889 Other specified postprocedural states: Secondary | ICD-10-CM | POA: Insufficient documentation

## 2016-04-02 DIAGNOSIS — M5030 Other cervical disc degeneration, unspecified cervical region: Secondary | ICD-10-CM

## 2016-04-02 DIAGNOSIS — G90522 Complex regional pain syndrome I of left lower limb: Secondary | ICD-10-CM

## 2016-04-02 DIAGNOSIS — M545 Low back pain: Secondary | ICD-10-CM | POA: Insufficient documentation

## 2016-04-02 DIAGNOSIS — M5137 Other intervertebral disc degeneration, lumbosacral region: Secondary | ICD-10-CM

## 2016-04-02 DIAGNOSIS — G43119 Migraine with aura, intractable, without status migrainosus: Secondary | ICD-10-CM

## 2016-04-02 DIAGNOSIS — M79606 Pain in leg, unspecified: Secondary | ICD-10-CM | POA: Diagnosis present

## 2016-04-02 DIAGNOSIS — M533 Sacrococcygeal disorders, not elsewhere classified: Secondary | ICD-10-CM

## 2016-04-02 DIAGNOSIS — M47816 Spondylosis without myelopathy or radiculopathy, lumbar region: Secondary | ICD-10-CM

## 2016-04-02 DIAGNOSIS — M5010 Cervical disc disorder with radiculopathy, unspecified cervical region: Secondary | ICD-10-CM

## 2016-04-02 MED ORDER — TRIAMCINOLONE ACETONIDE 40 MG/ML IJ SUSP
40.0000 mg | Freq: Once | INTRAMUSCULAR | Status: AC
  Administered 2016-04-02: 40 mg
  Filled 2016-04-02: qty 1

## 2016-04-02 MED ORDER — LIDOCAINE HCL (PF) 1 % IJ SOLN
10.0000 mL | Freq: Once | INTRAMUSCULAR | Status: AC
  Administered 2016-04-02: 10 mL via SUBCUTANEOUS
  Filled 2016-04-02: qty 10

## 2016-04-02 MED ORDER — CEFUROXIME AXETIL 250 MG PO TABS: 250.0000 mg | ORAL_TABLET | Freq: Two times a day (BID) | ORAL | Status: DC

## 2016-04-02 MED ORDER — FENTANYL CITRATE (PF) 100 MCG/2ML IJ SOLN
100.0000 ug | Freq: Once | INTRAMUSCULAR | Status: AC
  Administered 2016-04-02: 100 ug via INTRAVENOUS
  Filled 2016-04-02: qty 2

## 2016-04-02 MED ORDER — CEFAZOLIN SODIUM 1 G IJ SOLR
INTRAMUSCULAR | Status: AC
  Administered 2016-04-02: 1 g
  Filled 2016-04-02: qty 10

## 2016-04-02 MED ORDER — LACTATED RINGERS IV SOLN: 1000.0000 mL | INTRAVENOUS | Status: DC

## 2016-04-02 MED ORDER — ORPHENADRINE CITRATE 30 MG/ML IJ SOLN
60.0000 mg | Freq: Once | INTRAMUSCULAR | Status: AC
  Administered 2016-04-02: 60 mg via INTRAMUSCULAR
  Filled 2016-04-02: qty 2

## 2016-04-02 MED ORDER — BUPIVACAINE HCL (PF) 0.25 % IJ SOLN
30.0000 mL | Freq: Once | INTRAMUSCULAR | Status: AC
  Administered 2016-04-02: 30 mL
  Filled 2016-04-02: qty 30

## 2016-04-02 MED ORDER — CEFAZOLIN IN D5W 1 GM/50ML IV SOLN
1.0000 g | Freq: Once | INTRAVENOUS | Status: AC
  Administered 2016-04-02: 1 g via INTRAVENOUS

## 2016-04-02 MED ORDER — MIDAZOLAM HCL 5 MG/5ML IJ SOLN
5.0000 mg | Freq: Once | INTRAMUSCULAR | Status: AC
  Administered 2016-04-02: 5 mg via INTRAVENOUS
  Filled 2016-04-02: qty 5

## 2016-04-02 NOTE — Progress Notes (Signed)
Subjective:    Patient ID: Jill Rivas, female    DOB: 1971-05-19, 45 y.o.   MRN: [id number]  HPI  PROCEDURE PERFORMED: Lumbosacral selective nerve root block   NOTE: The patient is a 45 y.o. female who returns to [facility] Center for further evaluation and treatment of pain involving the lumbar and lower extremity region. The patient is with prior history of trauma to the left lower extremity requiring surgical intervention. There is concern regarding significant component of patient's pain being due to complex regional pain syndrome. The patient is felt to be with component of both sympathetically mediated pain as well as sympathetic independent pain and has had more significant improvement with somatic blocks consisting of lumbosacral selective nerve root block pain compared to the lumbar sympathetic block.. The risks, benefits, and expectations of the procedure have been explained to the patient who was understanding and in agreement with suggested treatment plan. We will proceed with interventional treatment as discussed and as explained to the patient. The patient is understanding and in agreement with suggested treatment plan.   DESCRIPTION OF PROCEDURE: Lumbosacral selective nerve root block with IV Versed, IV fentanyl conscious sedation, EKG, blood pressure, pulse, capnography, and pulse oximetry monitoring. The procedure was performed with the patient in the prone position under fluoroscopic guidance. With the patient in the prone position, Betadine prep of proposed entry site was performed. Local anesthetic skin wheal of proposed needle entry site was prepared with 1.5% plain lidocaine with AP view of the lumbosacral spine.   PROCEDURE #1: Needle placement at the left L 2 vertebral body: A 22 -gauge needle was inserted at the inferior border of the transverse process of the vertebral body with needle placed medial to the midline of the transverse process on AP view of the  lumbosacral spine.   NEEDLE PLACEMENT AT  L3, L4, and L5  VERTEBRAL BODY LEVELS  Needle  placement was accomplished at L3, L4, and L5  vertebral body levels on the left side exactly as was accomplished at the L2  vertebral body level  and utilizing the same technique and under fluoroscopic guidance.   Needle placement was then verified on lateral view at all levels with needle tip documented to be in the posterior superior quadrant of the intervertebral foramen of  L 2, L3, L4, and L5. Following negative aspiration for heme and CSF at each level, each level was injected with 3 mL of 0.25% bupivacaine with Kenalog.   The patient tolerated the procedure well. A total of 10 mg of Kenalog was utilized for the procedure.   PLAN:  1. Medications: Will continue presently prescribed medications. Topamax and oxycodone 2. The patient is to undergo follow-up evaluation with PCP Dr. Lennox Grumbles for evaluation of blood pressure and general medical condition status post procedure performed on today's visit. 3. Surgical follow-up evaluation. Patient will undergo further surgical evaluation as planned 4. Neurological evaluation. Has been addressed 5. May consider radiofrequency procedures, implantation type procedures and other treatment pending response to treatment and follow-up evaluation. 6. The patient has been advised do adhere to proper body mechanics and avoid activities which may aggravate condition. 7. The patient has been advised to call the Pain Management Center prior to scheduled return appointment should there be significant change in the patient's condition or should the patient have other concerns regarding condition prior to scheduled return appointment.   Review of Systems     Objective:   Physical Exam  Assessment & Plan:

## 2016-04-02 NOTE — Progress Notes (Signed)
Safety precautions to be maintained throughout the outpatient stay will include: orient to surroundings, keep bed in low position, maintain call bell within reach at all times, provide assistance with transfer out of bed and ambulation.  

## 2016-04-02 NOTE — Patient Instructions (Addendum)
PLAN   Continue present medication Topamax and oxycodone. Please obtain Ceftin antibiotic today and begin taking Ceftin antibiotic today as prescribed  F/U PCP Dr. Lennox Grumbles evaliation of  BP and general medical  condition..  F/U surgical evaluation as discussed. Follow up with neurosurgeon as discussed for further evaluation of pain of the neck and upper extremity regions associated with upper extremity weakness  F/U neurological evaluation. May consider PNCV/EMG studies and other studies pending follow-up evaluations  May consider radiofrequency rhizolysis or intraspinal procedures pending response to present treatment and F/U evaluation   Patient to call Pain Management Center should patient have concerns prior to scheduled return appointment A prescription for Ceftin was sent to your pharmacy. Pain Management Discharge Instructions  General Discharge Instructions :  If you need to reach your doctor call: Monday-Friday 8:00 am - 4:00 pm at [phone] or toll free [phone].  After clinic hours [phone] to have operator reach doctor.  Bring all of your medication bottles to all your appointments in the pain clinic.  To cancel or reschedule your appointment with Pain Management please remember to call 24 hours in advance to avoid a fee.  Refer to the educational materials which you have been given on: General Risks, I had my Procedure. Discharge Instructions, Post Sedation.  Post Procedure Instructions:  The drugs you were given will stay in your system until tomorrow, so for the next 24 hours you should not drive, make any legal decisions or drink any alcoholic beverages.  You may eat anything you prefer, but it is better to start with liquids then soups and crackers, and gradually work up to solid foods.  Please notify your doctor immediately if you have any unusual bleeding, trouble breathing or pain that is not related to your normal pain.  Depending on the type of  procedure that was done, some parts of your body may feel week and/or numb.  This usually clears up by tonight or the next day.  Walk with the use of an assistive device or accompanied by an adult for the 24 hours.  You may use ice on the affected area for the first 24 hours.  Put ice in a Ziploc bag and cover with a towel and place against area 15 minutes on 15 minutes off.  You may switch to heat after 24 hours.

## 2016-04-03 ENCOUNTER — Telehealth: Payer: Self-pay | Admitting: *Deleted

## 2016-04-03 NOTE — Telephone Encounter (Signed)
Message left

## 2016-04-15 ENCOUNTER — Encounter: Payer: Self-pay | Admitting: Pain Medicine

## 2016-04-15 ENCOUNTER — Ambulatory Visit: Payer: Medicare Other | Attending: Pain Medicine | Admitting: Pain Medicine

## 2016-04-15 VITALS — BP 127/84 | HR 78 | Temp 98.2°F | Resp 18 | Ht 67.0 in | Wt 182.0 lb

## 2016-04-15 DIAGNOSIS — M47892 Other spondylosis, cervical region: Secondary | ICD-10-CM | POA: Insufficient documentation

## 2016-04-15 DIAGNOSIS — R202 Paresthesia of skin: Secondary | ICD-10-CM | POA: Insufficient documentation

## 2016-04-15 DIAGNOSIS — M5030 Other cervical disc degeneration, unspecified cervical region: Secondary | ICD-10-CM

## 2016-04-15 DIAGNOSIS — M5137 Other intervertebral disc degeneration, lumbosacral region: Secondary | ICD-10-CM

## 2016-04-15 DIAGNOSIS — M5481 Occipital neuralgia: Secondary | ICD-10-CM

## 2016-04-15 DIAGNOSIS — M533 Sacrococcygeal disorders, not elsewhere classified: Secondary | ICD-10-CM | POA: Insufficient documentation

## 2016-04-15 DIAGNOSIS — G90522 Complex regional pain syndrome I of left lower limb: Secondary | ICD-10-CM

## 2016-04-15 DIAGNOSIS — M5010 Cervical disc disorder with radiculopathy, unspecified cervical region: Secondary | ICD-10-CM | POA: Insufficient documentation

## 2016-04-15 DIAGNOSIS — M545 Low back pain: Secondary | ICD-10-CM | POA: Diagnosis present

## 2016-04-15 MED ORDER — TOPIRAMATE 50 MG PO TABS: ORAL_TABLET | ORAL | 2 refills | Status: AC

## 2016-04-15 MED ORDER — OXYCODONE HCL 10 MG PO TABS: ORAL_TABLET | ORAL | 0 refills | Status: DC

## 2016-04-15 NOTE — Progress Notes (Signed)
The patient is a 45 year old female who returns to pain management for further evaluation and treatment of pain involving the lumbar lower extremity region with what is felt to be component of complex regional pain syndrome of the left lower extremity status post trauma surgery of the left lower extremity. The patient has significant improvement of pain paresthesias of the left lower extremity following lumbosacral selective nerve root block. The patient is also undergone evaluation of pain of the cervical and upper extremity regions by Dr. Joya Salm and is without recommendation for surgical intervention at the present time. The patient has been advised to undergo a other MRI of the cervical spine and May be considered for surgical intervention pending results of the cervical MRI and follow-up evaluation with Dr. Joya Salm. There is also concern regarding patient's complex regional pain syndrome spreading and involving the cervical and upper extremity region. We will remain aware of this condition as patient undergo further evaluation and assessment by Dr. Joya Salm with consideration being given to surgical intervention cervical region for pain of the cervical and upper extremity regions as discussed. The patient is aware of the condition and will readdress this with Dr. Joya Salm as well. We will continue Topamax and oxycodone as prescribed at this time. All agreed to suggested treatment plan. States that the lower extremity pain has improved significantly following previous lumbosacral selective nerve root block.   Physical examination  There was tenderness of the splenius capitis and occipitalis region a moderate degree with limited range of motion of the cervical spine. No definite radiation of pain to be upper extremity was noted with range of motion maneuvers of the cervical spine. The patient was with decreased grip strength on the left compared to the right. Tinel and Phalen's maneuver were without  increase of pain significant degree. Palpation of the acromial clavicular and glenohumeral joint regions reproduce mild to moderate discomfort. The patient has moderate difficulty attempt to perform drop test. Palpation over the thoracic region was with evidence of of the thoracic region with evidence of moderate muscle spasms involving the thoracic musculature region. Palpation of the lumbar paraspinal musculature region was attends to palpation of moderate degree with palpation of the PSIS and PII S region reproducing mild to moderate discomfort. The left lower extremity was with tenderness to palpation with evidence of allodynia with some slight cyanotic hue with decreased EHL strength and with slightly decreased temperature compared to the right lower extremity. No new lesions of the skin were noted. There was negative Homans. Abdomen nontender with no costovertebral angle tenderness noted.     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  F/U surgical evaluation as discussed. Proceed with neurosurgical reevaluation especially pain of the cervical region and of left upper extremity weakness as we discussed again today. Patient will undergo updated cervical MRI and reevaluation with Dr. Joya Salm as planned  F/U neurological evaluation as discussed  Wound Care clinic evaluation to be considered as discussed. We will avoid at this time  May consider radiofrequency rhizolysis or intraspinal procedures pending response to present treatment and  F/U  evaluation   Patient to call Pain Management Center should patient have concerns prior to scheduled return appointment

## 2016-04-15 NOTE — Progress Notes (Signed)
Safety precautions to be maintained throughout the outpatient stay will include: orient to surroundings, keep bed in low position, maintain call bell within reach at all times, provide assistance with transfer out of bed and ambulation.  

## 2016-04-15 NOTE — Patient Instructions (Addendum)
PLAN   Continue present medications Topamax and oxycodone and apply Lidoderm patches as prescribed  F/U PCP for evaluation of BP  and general medical condition as discussed.  F/U surgical evaluation as discussed. Proceed with neurosurgical reevaluation especially pain of the cervical region and of left upper extremity weakness as we discussed again today. Patient will undergo updated cervical MRI and reevaluation with Dr. Joya Salm as planned  F/U neurological evaluation as discussed  Wound Care clinic evaluation to be considered as discussed. We will avoid at this time  May consider radiofrequency rhizolysis or intraspinal procedures pending response to present treatment and F/U evaluation   Patient to call Pain Management Center should patient have concerns prior to scheduled return appointment

## 2016-05-01 ENCOUNTER — Other Ambulatory Visit: Payer: Self-pay | Admitting: Pain Medicine

## 2016-05-01 ENCOUNTER — Telehealth: Payer: Self-pay | Admitting: Pain Medicine

## 2016-05-01 DIAGNOSIS — M5010 Cervical disc disorder with radiculopathy, unspecified cervical region: Secondary | ICD-10-CM

## 2016-05-01 DIAGNOSIS — M5416 Radiculopathy, lumbar region: Secondary | ICD-10-CM

## 2016-05-01 DIAGNOSIS — G90529 Complex regional pain syndrome I of unspecified lower limb: Secondary | ICD-10-CM

## 2016-05-01 DIAGNOSIS — M47812 Spondylosis without myelopathy or radiculopathy, cervical region: Secondary | ICD-10-CM

## 2016-05-01 DIAGNOSIS — M5030 Other cervical disc degeneration, unspecified cervical region: Secondary | ICD-10-CM

## 2016-05-01 NOTE — Telephone Encounter (Signed)
Nurses The patient has significant abnormalities of the cervical spine. Recommend patient follow up with neurosurgeon at this time or go to the emergency room for evaluation to rule out cervical trauma and abnormalities contributing to lower extremity symptoms.  Please schedule neurosurgical evaluation if patient agrees.  Please schedule lumbosacral selective nerve root block for complex regional pain syndrome symptoms as well as radicular symptoms which may be contributing to patient's lumbar and lower extremity pain at this time   Please schedule lumbosacral selective nerve root block (SNRB) for Wednesday, 05/07/2016

## 2016-05-01 NOTE — Telephone Encounter (Signed)
Pain in left leg and lower back. No new symptoms, only a flare-up after falling. Requesting a procedure.

## 2016-05-01 NOTE — Telephone Encounter (Signed)
Thank you very much 

## 2016-05-01 NOTE — Telephone Encounter (Signed)
Patient agrees to SNRB at 10:45 on 05-07-16. Has already seen Dr. Joya Salm about 2 weeks ago for cervical spine abnormalities.Has MRI scheduled.

## 2016-05-01 NOTE — Telephone Encounter (Signed)
Had 2 falls and having increased back pain, RSV is flared up also, would like to come in for procedure

## 2016-05-07 ENCOUNTER — Ambulatory Visit: Payer: Medicare Other | Attending: Pain Medicine | Admitting: Pain Medicine

## 2016-05-07 ENCOUNTER — Encounter: Payer: Self-pay | Admitting: Pain Medicine

## 2016-05-07 VITALS — BP 103/56 | HR 73 | Temp 97.9°F | Resp 16 | Ht 67.0 in | Wt 182.0 lb

## 2016-05-07 DIAGNOSIS — M5030 Other cervical disc degeneration, unspecified cervical region: Secondary | ICD-10-CM

## 2016-05-07 DIAGNOSIS — G90529 Complex regional pain syndrome I of unspecified lower limb: Secondary | ICD-10-CM | POA: Insufficient documentation

## 2016-05-07 DIAGNOSIS — M5416 Radiculopathy, lumbar region: Secondary | ICD-10-CM

## 2016-05-07 DIAGNOSIS — M5137 Other intervertebral disc degeneration, lumbosacral region: Secondary | ICD-10-CM

## 2016-05-07 DIAGNOSIS — M545 Low back pain: Secondary | ICD-10-CM | POA: Diagnosis present

## 2016-05-07 DIAGNOSIS — M47816 Spondylosis without myelopathy or radiculopathy, lumbar region: Secondary | ICD-10-CM

## 2016-05-07 DIAGNOSIS — M5010 Cervical disc disorder with radiculopathy, unspecified cervical region: Secondary | ICD-10-CM

## 2016-05-07 DIAGNOSIS — M533 Sacrococcygeal disorders, not elsewhere classified: Secondary | ICD-10-CM

## 2016-05-07 DIAGNOSIS — M79606 Pain in leg, unspecified: Secondary | ICD-10-CM | POA: Diagnosis present

## 2016-05-07 DIAGNOSIS — M51379 Other intervertebral disc degeneration, lumbosacral region without mention of lumbar back pain or lower extremity pain: Secondary | ICD-10-CM

## 2016-05-07 DIAGNOSIS — G90522 Complex regional pain syndrome I of left lower limb: Secondary | ICD-10-CM

## 2016-05-07 MED ORDER — CEFAZOLIN SODIUM 1 G IJ SOLR
INTRAMUSCULAR | Status: AC
  Administered 2016-05-07: 1 g
  Filled 2016-05-07: qty 10

## 2016-05-07 MED ORDER — BUPIVACAINE HCL (PF) 0.5 % IJ SOLN
30.0000 mL | Freq: Once | INTRAMUSCULAR | Status: AC
  Administered 2016-05-07: 30 mL
  Filled 2016-05-07: qty 30

## 2016-05-07 MED ORDER — CEFAZOLIN IN D5W 1 GM/50ML IV SOLN
1.0000 g | Freq: Once | INTRAVENOUS | Status: AC
  Administered 2016-05-07: 1 g via INTRAVENOUS

## 2016-05-07 MED ORDER — CEFUROXIME AXETIL 250 MG PO TABS: 250.0000 mg | ORAL_TABLET | Freq: Two times a day (BID) | ORAL | 0 refills | Status: DC

## 2016-05-07 MED ORDER — TRIAMCINOLONE ACETONIDE 40 MG/ML IJ SUSP
40.0000 mg | Freq: Once | INTRAMUSCULAR | Status: AC
  Administered 2016-05-07: 40 mg
  Filled 2016-05-07: qty 1

## 2016-05-07 MED ORDER — LIDOCAINE HCL (PF) 1 % IJ SOLN
10.0000 mL | Freq: Once | INTRAMUSCULAR | Status: AC
  Administered 2016-05-07: 10 mL via SUBCUTANEOUS

## 2016-05-07 MED ORDER — MIDAZOLAM HCL 5 MG/5ML IJ SOLN
5.0000 mg | Freq: Once | INTRAMUSCULAR | Status: AC
  Administered 2016-05-07: 5 mg via INTRAVENOUS
  Filled 2016-05-07: qty 5

## 2016-05-07 MED ORDER — ORPHENADRINE CITRATE 30 MG/ML IJ SOLN
60.0000 mg | Freq: Once | INTRAMUSCULAR | Status: AC
  Administered 2016-05-07: 60 mg via INTRAMUSCULAR
  Filled 2016-05-07: qty 2

## 2016-05-07 MED ORDER — BUPIVACAINE HCL (PF) 0.5 % IJ SOLN
INTRAMUSCULAR | Status: AC
  Administered 2016-05-07: 12:00:00
  Filled 2016-05-07: qty 30

## 2016-05-07 MED ORDER — LACTATED RINGERS IV SOLN
1000.0000 mL | INTRAVENOUS | Status: DC
Start: 2016-05-07 — End: 2017-10-29

## 2016-05-07 MED ORDER — FENTANYL CITRATE (PF) 100 MCG/2ML IJ SOLN
100.0000 ug | Freq: Once | INTRAMUSCULAR | Status: AC
  Administered 2016-05-07: 100 ug via INTRAVENOUS
  Filled 2016-05-07: qty 2

## 2016-05-07 NOTE — Patient Instructions (Addendum)
PLAN   Continue present medication Topamax and oxycodone. Please obtain Ceftin antibiotic today and begin taking Ceftin antibiotic today as prescribed  F/U PCP Dr. Lennox Grumbles evaluation of  BP and general medical  condition..  F/U surgical evaluation as discussed. Follow up with neurosurgeon as discussed for further evaluation of pain of the neck and upper extremity regions associated with upper extremity weakness  F/U neurological evaluation. May consider PNCV/EMG studies and other studies pending follow-up evaluations  May consider radiofrequency rhizolysis or intraspinal procedures pending response to present treatment and F/U evaluation   Patient to call Pain Management Center should patient have concerns prior to scheduled return appointment  Selective Nerve Root Block Patient Information  Description: Specific nerve roots exit the spinal canal and these nerves can be compressed and inflamed by a bulging disc and bone spurs.  By injecting steroids on the nerve root, we can potentially decrease the inflammation surrounding these nerves, which often leads to decreased pain.  Also, by injecting local anesthesia on the nerve root, this can provide [place] helpful information to give to your referring doctor if it decreases your pain.  Selective nerve root blocks can be done along the spine from the neck to the low back depending on the location of your pain.   After numbing the skin with local anesthesia, a small needle is passed to the nerve root and the position of the needle is verified using x-ray pictures.  After the needle is in correct position, we then deposit the medication.  You may experience a pressure sensation while this is being done.  The entire block usually lasts less than 15 minutes.  Conditions that may be treated with selective nerve root blocks:  Low back and leg pain  Spinal stenosis  Diagnostic block prior to potential surgery  Neck and arm pain  Post laminectomy  syndrome  Preparation for the injection:  1. Do not eat any solid food or dairy products within 8 hours of your appointment. 2. You may drink clear liquids up to 3 hours before an appointment.  Clear liquids include water, black coffee, juice or soda.  No milk or cream please. 3. You may take your regular medications, including pain medications, with a sip of water before your appointment.  Diabetics should hold regular insulin (if taken separately) and take 1/2 normal NPH dose the morning of the procedure.  Carry some sugar containing items with you to your appointment. 4. A driver must accompany you and be prepared to drive you home after your procedure. 5. Bring all your current medications with you. 6. An IV may be inserted and sedation may be given at the discretion of the physician. 7. A blood pressure cuff, EKG, and other monitors will often be applied during the procedure.  Some patients may need to have extra oxygen administered for a short period. 8. You will be asked to provide medical information, including allergies, prior to the procedure.  We must know immediately if you are taking blood  Thinners (like Coumadin) or if you are allergic to IV iodine contrast (dye).  Possible side-effects: All are usually temporary  Bleeding from needle site  Light headedness  Numbness and tingling  Decreased blood pressure  Weakness in arms/legs  Pressure sensation in back/neck  Pain at injection site (several days)  Possible complications: All are extremely rare  Infection  Nerve injury  Spinal headache (a headache wore with upright position)  Call if you experience:  Fever/chills associated with headache or  increased back/neck pain  Headache worsened by an upright position  New onset weakness or numbness of an extremity below the injection site  Hives or difficulty breathing (go to the emergency room)  Inflammation or drainage at the injection site(s)  Severe  back/neck pain greater than usual  New symptoms which are concerning to you  Please note:  Although the local anesthetic injected can often make your back or neck feel good for several hours after the injection the pain will likely return.  It takes 3-5 days for steroids to work on the nerve root. You may not notice any pain relief for at least one week.  If effective, we will often do a series of 3 injections spaced 3-6 weeks apart to maximally decrease your pain.    If you have any questions, please call [phone] [facility]   Pain Management Discharge Instructions  General Discharge Instructions :  If you need to reach your doctor call: Monday-Friday 8:00 am - 4:00 pm at [phone] or toll free [phone].  After clinic hours [phone] to have operator reach doctor.  Bring all of your medication bottles to all your appointments in the pain clinic.  To cancel or reschedule your appointment with Pain Management please remember to call 24 hours in advance to avoid a fee.  Refer to the educational materials which you have been given on: General Risks, I had my Procedure. Discharge Instructions, Post Sedation.  Post Procedure Instructions:  The drugs you were given will stay in your system until tomorrow, so for the next 24 hours you should not drive, make any legal decisions or drink any alcoholic beverages.  You may eat anything you prefer, but it is better to start with liquids then soups and crackers, and gradually work up to solid foods.  Please notify your doctor immediately if you have any unusual bleeding, trouble breathing or pain that is not related to your normal pain.  Depending on the type of procedure that was done, some parts of your body may feel week and/or numb.  This usually clears up by tonight or the next day.  Walk with the use of an assistive device or accompanied by an adult for the 24 hours.  You may use ice  on the affected area for the first 24 hours.  Put ice in a Ziploc bag and cover with a towel and place against area 15 minutes on 15 minutes off.  You may switch to heat after 24 hours.  A prescription for ceftin was sent to your pharmacy and should be available for pickup today.

## 2016-05-07 NOTE — Progress Notes (Signed)
   PROCEDURE PERFORMED: Lumbosacral selective nerve root block   NOTE: The patient is a 45 y.o. female who returns to [facility] for further evaluation and treatment of pain involving the lumbar and lower extremity region. The patient is prior trauma to the left lower extremity requiring surgical intervention. The patient's pain has been felt to be due to significant component of complex regional pain syndrome. The patient has had more significant improvement of pain following lumbosacral selective nerve root block compared to lumbar sympathetic block.. The risks, benefits, and expectations of the procedure have been explained to the patient who was understanding and in agreement with suggested treatment plan. We will proceed with interventional treatment as discussed and as explained to the patient. The patient is understanding and in agreement with suggested treatment plan.   DESCRIPTION OF PROCEDURE: Lumbosacral selective nerve root block with IV Versed, IV fentanyl conscious sedation, EKG, blood pressure, pulse, capnography, and pulse oximetry monitoring. The procedure was performed with the patient in the prone position under fluoroscopic guidance. With the patient in the prone position, Betadine prep of proposed entry site was performed. Local anesthetic skin wheal of proposed needle entry site was prepared with 1.5% plain lidocaine with AP view of the lumbosacral spine.   PROCEDURE #1: Needle placement at the left L 2 vertebral body: A 22 -gauge needle was inserted at the inferior border of the transverse process of the vertebral body with needle placed medial to the midline of the transverse process on AP view of the lumbosacral spine.   NEEDLE PLACEMENT AT  L3, L4, and L5  VERTEBRAL BODY LEVELS  Needle  placement was accomplished at L3, L4, and L5  vertebral body levels on the left side exactly as was accomplished at the L2  vertebral body level  and utilizing the same technique and  under fluoroscopic guidance.   Needle placement was then verified on lateral view at all levels with needle tip documented to be in the posterior superior quadrant of the intervertebral foramen of  L 2 L3, L4, and L5. Following negative aspiration for heme and CSF at each level, each level was injected with 3 mL of 0.50% bupivacaine with Kenalog.    The patient tolerated the procedure well. A total of 10 mg of Kenalog was utilized for the procedure.   PLAN:  1. Medications: Will continue presently prescribed medications Topamax and oxycodone. 2. The patient is to undergo follow-up evaluation with PCP Dr. Lennox Grumbles for evaluation of blood pressure and general medical condition status post procedure performed on today's visit. 3. Surgical follow-up evaluation.. The patient will undergo surgical reevaluation as planned 4. Neurological evaluation. Has been addressed. We will avoid PNCV/EMG studies at this time 5. May consider radiofrequency procedures, implantation type procedures and other treatment pending response to treatment and follow-up evaluation. 6. The patient has been advised do adhere to proper body mechanics and avoid activities which may aggravate condition. 7. The patient has been advised to call the Pain Management Center prior to scheduled return appointment should there be significant change in the patient's condition or should the patient have other concerns regarding condition prior to scheduled return appointment.

## 2016-05-07 NOTE — Progress Notes (Signed)
Safety precautions to be maintained throughout the outpatient stay will include: orient to surroundings, keep bed in low position, maintain call bell within reach at all times, provide assistance with transfer out of bed and ambulation.  

## 2016-05-08 ENCOUNTER — Telehealth: Payer: Self-pay

## 2016-05-08 NOTE — Telephone Encounter (Signed)
Denies any needs at this time. Instructed to call if needed. 

## 2016-05-15 ENCOUNTER — Encounter: Payer: Self-pay | Admitting: Pain Medicine

## 2016-05-15 ENCOUNTER — Ambulatory Visit: Payer: Medicare Other | Attending: Pain Medicine | Admitting: Pain Medicine

## 2016-05-15 VITALS — BP 118/55 | HR 90 | Temp 98.4°F | Resp 15 | Ht 67.0 in | Wt 182.0 lb

## 2016-05-15 DIAGNOSIS — M5137 Other intervertebral disc degeneration, lumbosacral region: Secondary | ICD-10-CM

## 2016-05-15 DIAGNOSIS — M533 Sacrococcygeal disorders, not elsewhere classified: Secondary | ICD-10-CM | POA: Diagnosis not present

## 2016-05-15 DIAGNOSIS — M4722 Other spondylosis with radiculopathy, cervical region: Secondary | ICD-10-CM | POA: Insufficient documentation

## 2016-05-15 DIAGNOSIS — M545 Low back pain: Secondary | ICD-10-CM | POA: Diagnosis present

## 2016-05-15 DIAGNOSIS — M5010 Cervical disc disorder with radiculopathy, unspecified cervical region: Secondary | ICD-10-CM | POA: Diagnosis not present

## 2016-05-15 DIAGNOSIS — G90522 Complex regional pain syndrome I of left lower limb: Secondary | ICD-10-CM

## 2016-05-15 DIAGNOSIS — M5030 Other cervical disc degeneration, unspecified cervical region: Secondary | ICD-10-CM

## 2016-05-15 DIAGNOSIS — M5481 Occipital neuralgia: Secondary | ICD-10-CM

## 2016-05-15 DIAGNOSIS — M51379 Other intervertebral disc degeneration, lumbosacral region without mention of lumbar back pain or lower extremity pain: Secondary | ICD-10-CM

## 2016-05-15 DIAGNOSIS — M542 Cervicalgia: Secondary | ICD-10-CM | POA: Diagnosis present

## 2016-05-15 MED ORDER — DICLOFENAC SODIUM 1 % TD GEL: TRANSDERMAL | 0 refills | Status: AC

## 2016-05-15 MED ORDER — OXYCODONE HCL 10 MG PO TABS: ORAL_TABLET | ORAL | 0 refills | Status: AC

## 2016-05-15 MED ORDER — LIDOCAINE 5 % EX PTCH: MEDICATED_PATCH | CUTANEOUS | 0 refills | Status: DC

## 2016-05-15 NOTE — Progress Notes (Signed)
Safety precautions to be maintained throughout the outpatient stay will include: orient to surroundings, keep bed in low position, maintain call bell within reach at all times, provide assistance with transfer out of bed and ambulation.  

## 2016-05-15 NOTE — Patient Instructions (Addendum)
PLAN   Continue present medications Topamax and oxycodone and apply Lidoderm patches and Voltaren Gel to painful areas of skin as prescribed  Lumbosacral selective nerve root block to be performed at time of return appointment  F/U PCP for evaluation of BP  and general medical condition as discussed.  F/U surgical evaluation as discussed. Proceed with neurosurgical reevaluation especially pain of the cervical region and of left upper extremity weakness as we discussed again today. Patient will undergo updated cervical MRI and reevaluation with Dr. Joya Salm as planned  F/U neurological evaluation as discussed  Wound Care clinic evaluation to be considered as discussed. We will avoid at this time  May consider radiofrequency rhizolysis or intraspinal procedures pending response to present treatment and F/U evaluation   Patient to call [facility] should patient have concerns prior to scheduled return appointmentSelective Nerve Root Block Patient Information  Description: Specific nerve roots exit the spinal canal and these nerves can be compressed and inflamed by a bulging disc and bone spurs.  By injecting steroids on the nerve root, we can potentially decrease the inflammation surrounding these nerves, which often leads to decreased pain.  Also, by injecting local anesthesia on the nerve root, this can provide [place] helpful information to give to your referring doctor if it decreases your pain.  Selective nerve root blocks can be done along the spine from the neck to the low back depending on the location of your pain.   After numbing the skin with local anesthesia, a small needle is passed to the nerve root and the position of the needle is verified using x-ray pictures.  After the needle is in correct position, we then deposit the medication.  You may experience a pressure sensation while this is being done.  The entire block usually lasts less than 15 minutes.  Conditions that may be  treated with selective nerve root blocks:  Low back and leg pain  Spinal stenosis  Diagnostic block prior to potential surgery  Neck and arm pain  Post laminectomy syndrome  Preparation for the injection:  1. Do not eat any solid food or dairy products within 8 hours of your appointment. 2. You may drink clear liquids up to 3 hours before an appointment.  Clear liquids include water, black coffee, juice or soda.  No milk or cream please. 3. You may take your regular medications, including pain medications, with a sip of water before your appointment.  Diabetics should hold regular insulin (if taken separately) and take 1/2 normal NPH dose the morning of the procedure.  Carry some sugar containing items with you to your appointment. 4. A driver must accompany you and be prepared to drive you home after your procedure. 5. Bring all your current medications with you. 6. An IV may be inserted and sedation may be given at the discretion of the physician. 7. A blood pressure cuff, EKG, and other monitors will often be applied during the procedure.  Some patients may need to have extra oxygen administered for a short period. 8. You will be asked to provide medical information, including allergies, prior to the procedure.  We must know immediately if you are taking blood  Thinners (like Coumadin) or if you are allergic to IV iodine contrast (dye).  Possible side-effects: All are usually temporary  Bleeding from needle site  Light headedness  Numbness and tingling  Decreased blood pressure  Weakness in arms/legs  Pressure sensation in back/neck  Pain at injection site (several days)  Possible complications: All are extremely rare  Infection  Nerve injury  Spinal headache (a headache wore with upright position)  Call if you experience:  Fever/chills associated with headache or increased back/neck pain  Headache worsened by an upright position  New onset weakness or  numbness of an extremity below the injection site  Hives or difficulty breathing (go to the emergency room)  Inflammation or drainage at the injection site(s)  Severe back/neck pain greater than usual  New symptoms which are concerning to you  Please note:  Although the local anesthetic injected can often make your back or neck feel good for several hours after the injection the pain will likely return.  It takes 3-5 days for steroids to work on the nerve root. You may not notice any pain relief for at least one week.  If effective, we will often do a series of 3 injections spaced 3-6 weeks apart to maximally decrease your pain.    If you have any questions, please call [phone] [facility] ClinicGENERAL RISKS AND COMPLICATIONS  What are the risk, side effects and possible complications? Generally speaking, most procedures are safe.  However, with any procedure there are risks, side effects, and the possibility of complications.  The risks and complications are dependent upon the sites that are lesioned, or the type of nerve block to be performed.  The closer the procedure is to the spine, the more serious the risks are.  Great care is taken when placing the radio frequency needles, block needles or lesioning probes, but sometimes complications can occur. 1. Infection: Any time there is an injection through the skin, there is a risk of infection.  This is why sterile conditions are used for these blocks.  There are four possible types of infection. 1. Localized skin infection. 2. Central Nervous System Infection-This can be in the form of Meningitis, which can be deadly. 3. Epidural Infections-This can be in the form of an epidural abscess, which can cause pressure inside of the spine, causing compression of the spinal cord with subsequent paralysis. This would require an emergency surgery to decompress, and there are no guarantees that the patient would recover  from the paralysis. 4. Discitis-This is an infection of the intervertebral discs.  It occurs in about 1% of discography procedures.  It is difficult to treat and it may lead to surgery.        2. Pain: the needles have to go through skin and soft tissues, will cause soreness.       3. Damage to internal structures:  The nerves to be lesioned may be near blood vessels or    other nerves which can be potentially damaged.       4. Bleeding: Bleeding is more common if the patient is taking blood thinners such as  aspirin, Coumadin, Ticiid, Plavix, etc., or if he/she have some genetic predisposition  such as hemophilia. Bleeding into the spinal canal can cause compression of the spinal  cord with subsequent paralysis.  This would require an emergency surgery to  decompress and there are no guarantees that the patient would recover from the  paralysis.       5. Pneumothorax:  Puncturing of a lung is a possibility, every time a needle is introduced in  the area of the chest or upper back.  Pneumothorax refers to free air around the  collapsed lung(s), inside of the thoracic cavity (chest cavity).  Another two possible  complications related to  a similar event would include: Hemothorax and Chylothorax.   These are variations of the Pneumothorax, where instead of air around the collapsed  lung(s), you may have blood or chyle, respectively.       6. Spinal headaches: They may occur with any procedures in the area of the spine.       7. Persistent CSF (Cerebro-Spinal Fluid) leakage: This is a rare problem, but may occur  with prolonged intrathecal or epidural catheters either due to the formation of a fistulous  track or a dural tear.       8. Nerve damage: By working so close to the spinal cord, there is always a possibility of  nerve damage, which could be as serious as a permanent spinal cord injury with  paralysis.       9. Death:  Although rare, severe deadly allergic reactions known as "Anaphylactic  reaction"  can occur to any of the medications used.      10. Worsening of the symptoms:  We can always make thing worse.  What are the chances of something like this happening? Chances of any of this occuring are extremely low.  By statistics, you have more of a chance of getting killed in a motor vehicle accident: while driving to the hospital than any of the above occurring .  Nevertheless, you should be aware that they are possibilities.  In general, it is similar to taking a shower.  Everybody knows that you can slip, hit your head and get killed.  Does that mean that you should not shower again?  Nevertheless always keep in mind that statistics do not mean anything if you happen to be on the wrong side of them.  Even if a procedure has a 1 (one) in a 1,000,000 (million) chance of going wrong, it you happen to be that one..Also, keep in mind that by statistics, you have more of a chance of having something go wrong when taking medications.  Who should not have this procedure? If you are on a blood thinning medication (e.g. Coumadin, Plavix, see list of "Blood Thinners"), or if you have an active infection going on, you should not have the procedure.  If you are taking any blood thinners, please inform your physician.  How should I prepare for this procedure?  Do not eat or drink anything at least six hours prior to the procedure.  Bring a driver with you .  It cannot be a taxi.  Come accompanied by an adult that can drive you back, and that is strong enough to help you if your legs get weak or numb from the local anesthetic.  Take all of your medicines the morning of the procedure with just enough water to swallow them.  If you have diabetes, make sure that you are scheduled to have your procedure done first thing in the morning, whenever possible.  If you have diabetes, take only half of your insulin dose and notify our nurse that you have done so as soon as you arrive at the clinic.  If you are  diabetic, but only take blood sugar pills (oral hypoglycemic), then do not take them on the morning of your procedure.  You may take them after you have had the procedure.  Do not take aspirin or any aspirin-containing medications, at least eleven (11) days prior to the procedure.  They may prolong bleeding.  Wear loose fitting clothing that may be easy to take off and that you would not  mind if it got stained with Betadine or blood.  Do not wear any jewelry or perfume  Remove any nail coloring.  It will interfere with some of our monitoring equipment.  NOTE: Remember that this is not meant to be interpreted as a complete list of all possible complications.  Unforeseen problems may occur.  BLOOD THINNERS The following drugs contain aspirin or other products, which can cause increased bleeding during surgery and should not be taken for 2 weeks prior to and 1 week after surgery.  If you should need take something for relief of minor pain, you may take acetaminophen which is found in Tylenol,m Datril, Anacin-3 and Panadol. It is not blood thinner. The products listed below are.  Do not take any of the products listed below in addition to any listed on your instruction sheet.  A.P.C or A.P.C with Codeine Codeine Phosphate Capsules #3 Ibuprofen Ridaura  ABC compound Congesprin Imuran rimadil  Advil Cope Indocin Robaxisal  Alka-Seltzer Effervescent Pain Reliever and Antacid Coricidin or Coricidin-D  Indomethacin Rufen  Alka-Seltzer plus Cold Medicine Cosprin Ketoprofen S-A-C Tablets  Anacin Analgesic Tablets or Capsules Coumadin Korlgesic Salflex  Anacin Extra Strength Analgesic tablets or capsules CP-2 Tablets Lanoril Salicylate  Anaprox Cuprimine Capsules Levenox Salocol  Anexsia-D Dalteparin Magan Salsalate  Anodynos Darvon compound Magnesium Salicylate Sine-off  Ansaid Dasin Capsules Magsal Sodium Salicylate  Anturane Depen Capsules Marnal Soma  APF Arthritis pain formula Dewitt's Pills  Measurin Stanback  Argesic Dia-Gesic Meclofenamic Sulfinpyrazone  Arthritis Bayer Timed Release Aspirin Diclofenac Meclomen Sulindac  Arthritis pain formula Anacin Dicumarol Medipren Supac  Analgesic (Safety coated) Arthralgen Diffunasal Mefanamic Suprofen  Arthritis Strength Bufferin Dihydrocodeine Mepro Compound Suprol  Arthropan liquid Dopirydamole Methcarbomol with Aspirin Synalgos  ASA tablets/Enseals Disalcid Micrainin Tagament  Ascriptin Doan's Midol Talwin  Ascriptin A/D Dolene Mobidin Tanderil  Ascriptin Extra Strength Dolobid Moblgesic Ticlid  Ascriptin with Codeine Doloprin or Doloprin with Codeine Momentum Tolectin  Asperbuf Duoprin Mono-gesic Trendar  Aspergum Duradyne Motrin or Motrin IB Triminicin  Aspirin plain, buffered or enteric coated Durasal Myochrisine Trigesic  Aspirin Suppositories Easprin Nalfon Trillsate  Aspirin with Codeine Ecotrin Regular or Extra Strength Naprosyn Uracel  Atromid-S Efficin Naproxen Ursinus  Auranofin Capsules Elmiron Neocylate Vanquish  Axotal Emagrin Norgesic Verin  Azathioprine Empirin or Empirin with Codeine Normiflo Vitamin E  Azolid Emprazil Nuprin Voltaren  Bayer Aspirin plain, buffered or children's or timed BC Tablets or powders Encaprin Orgaran Warfarin Sodium  Buff-a-Comp Enoxaparin Orudis Zorpin  Buff-a-Comp with Codeine Equegesic Os-Cal-Gesic   Buffaprin Excedrin plain, buffered or Extra Strength Oxalid   Bufferin Arthritis Strength Feldene Oxphenbutazone   Bufferin plain or Extra Strength Feldene Capsules Oxycodone with Aspirin   Bufferin with Codeine Fenoprofen Fenoprofen Pabalate or Pabalate-SF   Buffets II Flogesic Panagesic   Buffinol plain or Extra Strength Florinal or Florinal with Codeine Panwarfarin   Buf-Tabs Flurbiprofen Penicillamine   Butalbital Compound Four-way cold tablets Penicillin   Butazolidin Fragmin Pepto-Bismol   Carbenicillin Geminisyn Percodan   Carna Arthritis Reliever Geopen Persantine    Carprofen Gold's salt Persistin   Chloramphenicol Goody's Phenylbutazone   Chloromycetin Haltrain Piroxlcam   Clmetidine heparin Plaquenil   Cllnoril Hyco-pap Ponstel   Clofibrate Hydroxy chloroquine Propoxyphen         Before stopping any of these medications, be sure to consult the physician who ordered them.  Some, such as Coumadin (Warfarin) are ordered to prevent or treat serious conditions such as "deep thrombosis", "pumonary embolisms", and other heart problems.  The  amount of time that you may need off of the medication may also vary with the medication and the reason for which you were taking it.  If you are taking any of these medications, please make sure you notify your pain physician before you undergo any procedures.

## 2016-05-15 NOTE — Progress Notes (Signed)
The patient is a 45 year old female who returns to pain management for further evaluation and treatment of pain involving the lower back lower extremity region especially on the left as well as the neck and upper extremity regions. The patient is with pain of the lower extremities which has improved with interventional treatments of significant degree. The patient is with pain due to complex regional pain syndrome of the left lower extremity with prior injury and surgery of the left lower extremity. The patient also is with pain of the cervical region and decreased grip strength with known abnormalities of the cervical spine will undergo follow-up neurosurgical evaluation as discussed. The patient is in hopes of being able to avoid surgery of the cervical region. We will continue Topamax and oxycodone at this time and patient will call pain management should they be significant change in condition. We will proceed with interventional treatment at time return appointment consisting of lumbosacral selective nerve root block in attempt to decrease severity of patient's symptoms, minimize progression of patient's symptoms, and avoid the need for more involved treatment. The patient was with understanding and in agreement with suggested treatment plan.     Physical examination  There was tenderness to palpation of the splenius capitis and occipitalis region palpation of these regions reproduced pain of moderate degree. Palpation of the cervical facet cervical paraspinal musculature regions reproduce moderate discomfort. The patient appeared to be with decreased grip strength with what appeared to be unremarkable Spurling's maneuver. Palpation of the acromioclavicular and glenohumeral joint regions reproduces minimal discomfort. Palpation over the thoracic region was with tenderness to palpation without crepitus of the thoracic region noted. Palpation of the lumbar region was of increased pain with  lateral bending rotation extension and palpation over the lumbar facets. Straight leg raising was tolerates approximately 20 without increased pain with dorsiflexion noted. There was tenderness of the PSIS and PII S regions of mild to moderate degree. There was mild to moderate tenderness of the greater trochanteric region iliotibial band region. Palpation of the lower extremities were with significant increased pain with palpation of the left foot with cyanotic hue of the left foot compared to the right foot. There was no sensory deficit or dermatomal distribution detected. There was negative Homans. 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  F/U surgical evaluation as discussed. Proceed with neurosurgical reevaluation especially pain of the cervical region and of left upper extremity weakness as we discussed again today. Patient will undergo updated cervical MRI and reevaluation with Dr. Joya Salm as planned  F/U neurological evaluation as discussed  Wound Care clinic evaluation to be considered as discussed. We will avoid at this time  May consider radiofrequency rhizolysis or intraspinal procedures pending response to present treatment and F/U evaluation   Patient to call Pain Management Center should patient have concerns prior to scheduled return  appointment

## 2016-05-27 ENCOUNTER — Telehealth: Payer: Self-pay | Admitting: *Deleted

## 2016-06-11 ENCOUNTER — Ambulatory Visit: Payer: Medicare Other | Admitting: Pain Medicine

## 2016-06-15 ENCOUNTER — Other Ambulatory Visit: Payer: Self-pay | Admitting: Pain Medicine

## 2016-06-19 ENCOUNTER — Other Ambulatory Visit: Payer: Self-pay | Admitting: Pain Medicine

## 2016-06-25 ENCOUNTER — Other Ambulatory Visit: Payer: Self-pay | Admitting: Pain Medicine

## 2016-07-07 ENCOUNTER — Other Ambulatory Visit: Payer: Self-pay | Admitting: Family Medicine

## 2016-07-07 DIAGNOSIS — N921 Excessive and frequent menstruation with irregular cycle: Secondary | ICD-10-CM

## 2016-07-10 ENCOUNTER — Ambulatory Visit: Payer: Medicare Other

## 2016-07-20 ENCOUNTER — Other Ambulatory Visit: Payer: Self-pay | Admitting: Pain Medicine

## 2016-09-22 DIAGNOSIS — S129XXA Fracture of neck, unspecified, initial encounter: Secondary | ICD-10-CM

## 2016-09-22 HISTORY — DX: Fracture of neck, unspecified, initial encounter: S12.9XXA

## 2016-09-24 ENCOUNTER — Other Ambulatory Visit: Payer: Self-pay | Admitting: Pain Medicine

## 2017-02-17 ENCOUNTER — Encounter: Payer: Self-pay | Admitting: Obstetrics and Gynecology

## 2017-02-17 ENCOUNTER — Ambulatory Visit (INDEPENDENT_AMBULATORY_CARE_PROVIDER_SITE_OTHER): Payer: Medicare Other | Admitting: Obstetrics and Gynecology

## 2017-02-17 VITALS — BP 113/79 | HR 84 | Ht 67.0 in | Wt 196.5 lb

## 2017-02-17 DIAGNOSIS — Z809 Family history of malignant neoplasm, unspecified: Secondary | ICD-10-CM

## 2017-02-17 DIAGNOSIS — N946 Dysmenorrhea, unspecified: Secondary | ICD-10-CM

## 2017-02-17 DIAGNOSIS — Z808 Family history of malignant neoplasm of other organs or systems: Secondary | ICD-10-CM

## 2017-02-17 DIAGNOSIS — N941 Unspecified dyspareunia: Secondary | ICD-10-CM

## 2017-02-17 DIAGNOSIS — N938 Other specified abnormal uterine and vaginal bleeding: Secondary | ICD-10-CM

## 2017-02-17 NOTE — Progress Notes (Signed)
HPI:      [name] is a 46 y.o. G0P0000 who LMP was Patient's last menstrual period was 01/06/2017 (exact date).  Subjective:   She presents today With complaint of severe dysmenorrhea and dyspareunia. Also complains of 2 menses per month over the last 2 years. Strong family history of breast ovarian and colon cancer. Her mother had all 3. Her mother's sister also had bilateral mastectomy for family history of breast cancer. The patient's sister recently had a hysterectomy for uterine fibroids. She states she is bleeding heavily today. She states that a previous physician diagnosed her with PCO and she tried Clomid for pregnancy without success. She has never had irregular cycles.    Hx: The following portions of the patient's history were reviewed and updated as appropriate:             She  has a past medical history of Anxiety; CRPS (complex regional pain syndrome); Depression; Kidney stones; Migraines; PTSD (post-traumatic stress disorder); Reflex sympathetic dystrophy; and Sleep apnea. She  does not have any pertinent problems on file. She  has a past surgical history that includes Cholecystectomy; Foot surgery (Left); Kidney surgery; Breast surgery; Breast enhancement surgery (Bilateral); and Augmentation mammaplasty (Bilateral, 2005). Her family history includes Breast cancer in her mother; Cancer in her mother. She  reports that she has never smoked. She has never used smokeless tobacco. She reports that she does not drink alcohol or use drugs. She is allergic to no known allergies.       Review of Systems:  Review of Systems  Constitutional: Denied constitutional symptoms, night sweats, recent illness, fatigue, fever, insomnia and weight loss.  Eyes: Denied eye symptoms, eye pain, photophobia, vision change and visual disturbance.  Ears/Nose/Throat/Neck: Denied ear, nose, throat or neck symptoms, hearing loss, nasal discharge, sinus congestion and sore throat.   Cardiovascular: Denied cardiovascular symptoms, arrhythmia, chest pain/pressure, edema, exercise intolerance, orthopnea and palpitations.  Respiratory: Denied pulmonary symptoms, asthma, pleuritic pain, productive sputum, cough, dyspnea and wheezing.  Gastrointestinal: Denied, gastro-esophageal reflux, melena, nausea and vomiting.  Genitourinary: See HPI for additional information.  Musculoskeletal: Denied musculoskeletal symptoms, stiffness, swelling, muscle weakness and myalgia.  Dermatologic: Denied dermatology symptoms, rash and scar.  Neurologic: Denied neurology symptoms, dizziness, headache, neck pain and syncope.  Psychiatric: Denied psychiatric symptoms, anxiety and depression.  Endocrine: Denied endocrine symptoms including hot flashes and night sweats.   Meds:   Current Outpatient Prescriptions on File Prior to Visit  Medication Sig Dispense Refill  . diazepam (VALIUM) 5 MG tablet Take 5 mg by mouth 4 (four) times daily.    . DULoxetine (CYMBALTA) 60 MG capsule Take 30 mg by mouth daily.     . Oxycodone HCl 10 MG TABS Limit 4-6 tablets po daily if tolerated 180 tablet 0  . propranolol ER (INDERAL LA) 60 MG 24 hr capsule Take 60 mg by mouth daily.    . QUEtiapine (SEROQUEL) 300 MG tablet Take 300 mg by mouth at bedtime.    . topiramate (TOPAMAX) 50 MG tablet Limit 1-2 tabs by mouth twice a day if tolerated 120 tablet 2  . zolpidem (AMBIEN) 5 MG tablet Take 5 mg by mouth at bedtime as needed for sleep.    . cefUROXime (CEFTIN) 250 MG tablet Take 1 tablet (250 mg total) by mouth 2 (two) times daily with a meal. (Patient not taking: Reported on 04/02/2016) 14 tablet 0  . cefUROXime (CEFTIN) 250 MG tablet Take 1 tablet (250 mg total) by  mouth 2 (two) times daily with a meal. (Patient not taking: Reported on 04/02/2016) 14 tablet 0  . cefUROXime (CEFTIN) 250 MG tablet Take 1 tablet (250 mg total) by mouth 2 (two) times daily with a meal. (Patient not taking: Reported on 04/02/2016) 14  tablet 0  . cefUROXime (CEFTIN) 250 MG tablet Take 1 tablet (250 mg total) by mouth 2 (two) times daily with a meal. (Patient not taking: Reported on 04/02/2016) 14 tablet 0  . cefUROXime (CEFTIN) 250 MG tablet Take 1 tablet (250 mg total) by mouth 2 (two) times daily with a meal. (Patient not taking: Reported on 04/02/2016) 14 tablet 0  . cefUROXime (CEFTIN) 250 MG tablet Take 1 tablet (250 mg total) by mouth 2 (two) times daily with a meal. (Patient not taking: Reported on 04/02/2016) 14 tablet 0  . cefUROXime (CEFTIN) 250 MG tablet Take 1 tablet (250 mg total) by mouth 2 (two) times daily with a meal. (Patient not taking: Reported on 04/02/2016) 14 tablet 0  . cefUROXime (CEFTIN) 250 MG tablet Take 1 tablet (250 mg total) by mouth 2 (two) times daily with a meal. (Patient not taking: Reported on 05/07/2016) 14 tablet 0  . cefUROXime (CEFTIN) 250 MG tablet Take 1 tablet (250 mg total) by mouth 2 (two) times daily with a meal. (Patient not taking: Reported on 02/17/2017) 14 tablet 0  . cefUROXime (CEFTIN) 500 MG tablet Limit 1 tab by mouth twice per day if tolerated  ( NO CIPRO ) (Patient not taking: Reported on 12/12/2015) 14 tablet 0  . ciprofloxacin (CIPRO) 500 MG tablet Limit 1 tablet by mouth twice per day if tolerated (Patient not taking: Reported on 01/21/2016) 14 tablet 0  . Cyanocobalamin (VITAMIN B-12 IJ) Inject as directed once a week. Reported on 01/21/2016    . diclofenac sodium (VOLTAREN) 1 % GEL Apply 2-4 g to painful area of skin 4 times per day if tolerated. Patient with history of strain, sprain, and muscle spasms of extremities (Patient not taking: Reported on 02/17/2017) 500 g 0  . ibuprofen (ADVIL,MOTRIN) 200 MG tablet Take 200 mg by mouth every 6 (six) hours as needed.    . lidocaine (LIDODERM) 5 % Apply 1 - 2  patches to painful area of skin for 12 hours then remove for 12 hours and  Repeat process if tolerated (Patient not taking: Reported on 02/17/2017) 60 patch 0  . lidocaine (LIDODERM) 5  % Apply 1 - 2  patches to painful area of skin for 12 hours then remove for 12 hours and  Repeat process if tolerated  Patient with history of shingles (Patient not taking: Reported on 02/17/2017) 60 patch 0  . loratadine (CLARITIN) 10 MG tablet Take 10 mg by mouth daily.    . pantoprazole (PROTONIX) 40 MG tablet Take 40 mg by mouth daily.    . prazosin (MINIPRESS) 1 MG capsule Take 2 mg by mouth at bedtime.     . sertraline (ZOLOFT) 100 MG tablet Take 100 mg by mouth 2 (two) times daily. Reported on 04/02/2016    . tiZANidine (ZANAFLEX) 4 MG capsule Take 4 mg by mouth 3 (three) times daily.     Current Facility-Administered Medications on File Prior to Visit  Medication Dose Route Frequency Provider Last Rate Last Dose  . bupivacaine (PF) (MARCAINE) 0.25 % injection 30 mL  30 mL Other Once Mohammed Kindle, MD      . bupivacaine (PF) (MARCAINE) 0.25 % injection 30 mL  30 mL Other  Once Mohammed Kindle, MD      . bupivacaine (PF) (MARCAINE) 0.25 % injection 30 mL  30 mL Other Once Mohammed Kindle, MD      . ceFAZolin (ANCEF) IVPB 1 g/50 mL premix  1 g Intravenous Once Mohammed Kindle, MD      . ceFAZolin (ANCEF) IVPB 1 g/50 mL premix  1 g Intravenous Once Mohammed Kindle, MD      . ceFAZolin (ANCEF) IVPB 1 g/50 mL premix  1 g Intravenous Once Mohammed Kindle, MD      . ceFAZolin (ANCEF) IVPB 1 g/50 mL premix  1 g Intravenous Once Mohammed Kindle, MD      . ceFAZolin (ANCEF) IVPB 1 g/50 mL premix  1 g Intravenous Once Mohammed Kindle, MD      . fentaNYL (SUBLIMAZE) injection 100 mcg  100 mcg Intravenous Once Mohammed Kindle, MD      . fentaNYL (SUBLIMAZE) injection 100 mcg  100 mcg Intravenous Once Mohammed Kindle, MD      . fentaNYL (SUBLIMAZE) injection 100 mcg  100 mcg Intravenous Once Mohammed Kindle, MD      . lactated ringers infusion 1,000 mL  1,000 mL Intravenous Continuous Mohammed Kindle, MD      . lactated ringers infusion 1,000 mL  1,000 mL Intravenous Continuous Mohammed Kindle, MD      . lactated  ringers infusion 1,000 mL  1,000 mL Intravenous Continuous Mohammed Kindle, MD      . lactated ringers infusion 1,000 mL  1,000 mL Intravenous Continuous Mohammed Kindle, MD      . lactated ringers infusion 1,000 mL  1,000 mL Intravenous Continuous Mohammed Kindle, MD      . lactated ringers infusion 1,000 mL  1,000 mL Intravenous Continuous Mohammed Kindle, MD      . lactated ringers infusion 1,000 mL  1,000 mL Intravenous Continuous Mohammed Kindle, MD      . lidocaine (PF) (XYLOCAINE) 1 % injection 10 mL  10 mL Subcutaneous Once Mohammed Kindle, MD      . lidocaine (PF) (XYLOCAINE) 1 % injection 10 mL  10 mL Subcutaneous Once Mohammed Kindle, MD      . lidocaine (PF) (XYLOCAINE) 1 % injection 10 mL  10 mL Subcutaneous Once Mohammed Kindle, MD      . lidocaine (PF) (XYLOCAINE) 1 % injection 10 mL  10 mL Subcutaneous Once Mohammed Kindle, MD      . midazolam (VERSED) 5 MG/5ML injection 5 mg  5 mg Intravenous Once Mohammed Kindle, MD      . midazolam (VERSED) 5 MG/5ML injection 5 mg  5 mg Intravenous Once Mohammed Kindle, MD      . midazolam (VERSED) 5 MG/5ML injection 5 mg  5 mg Intravenous Once Mohammed Kindle, MD      . orphenadrine (NORFLEX) injection 60 mg  60 mg Intramuscular Once Mohammed Kindle, MD      . orphenadrine (NORFLEX) injection 60 mg  60 mg Intramuscular Once Mohammed Kindle, MD      . orphenadrine (NORFLEX) injection 60 mg  60 mg Intramuscular Once Mohammed Kindle, MD      . triamcinolone acetonide (KENALOG-40) injection 40 mg  40 mg Other Once Mohammed Kindle, MD      . triamcinolone acetonide (KENALOG-40) injection 40 mg  40 mg Other Once Mohammed Kindle, MD      . triamcinolone acetonide (KENALOG-40) injection 40 mg  40 mg Other Once Mohammed Kindle, MD        Objective:  Vitals:   02/17/17 1502  BP: 113/79  Pulse: 84              Abdominal examination reveals suprapubic pain possible enlarged uterus.  Patient has declined pelvic exam and endometrial biopsy today because of her  bleeding.  Assessment:    G0P0000 Patient Active Problem List   Diagnosis Date Noted  . Bilateral occipital neuralgia 01/10/2016  . Cervical disc disorder with radiculopathy of cervical region 02/27/2015  . 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 DDD (degenerative disc disease), cervical with radiculopathy 01/23/2015  . CRPS 1 (complex regional pain syndrome I) of lower limb 01/23/2015  . DDD (degenerative disc disease), lumbosacral 01/23/2015     1. Dysmenorrhea   2. Dyspareunia in female   3. DUB (dysfunctional uterine bleeding)   4. Family history of cancer in mother     Patient at significantly increased risk for GYN cancer based on family history. I have strongly recommended genetic testing.    Plan:            1.  Pelvic ultrasound to rule out fibroids. Also check Dopplers of ovaries.  2.  Recommend endometrial biopsy. Patient would like to do this when she is not bleeding. (Next week)  3.  Consider hormonal control of menses if endometrial biopsy negative.  4.  Genetic testing for inheritable conditions. Order    Orders Placed This Encounter  Procedures  . US Transvaginal Non-OB     Meds ordered this encounter  Medications  . cetirizine (ZYRTEC) 10 MG tablet    Sig: Take 10 mg by mouth daily.    Refill:  5  . omeprazole (PRILOSEC) 40 MG capsule    Sig: TAKE 1 TABLET BY MOUTH DAILY FOR REFLUX    Refill:  5  . pravastatin (PRAVACHOL) 20 MG tablet    Sig: TAKE 1 TABLET BY MOUTH EVERY DAY FOR HIGH CHOLESTEROL    Refill:  2  . QUEtiapine (SEROQUEL) 400 MG tablet    Sig: Take 400 mg by mouth at bedtime.    Refill:  2  . triamcinolone (KENALOG) 0.025 % cream    Sig: APPLY TO AFFECTED AREA TWICE DAILY FOR UP TO TWO WEEKS    Refill:  1        F/U  Return in about 1 week (around 02/24/2017). I spent 31 minutes with this patient of which greater than 50% was spent discussing irregular bleeding, PCO, history of genetic GYN cancer, workup using ultrasound and  endometrial biopsy, possible hormonal control of menses.  Finis Bud, M.D. 02/17/2017 3:56 PM

## 2017-02-24 ENCOUNTER — Encounter: Payer: Self-pay | Admitting: Obstetrics and Gynecology

## 2017-02-24 ENCOUNTER — Ambulatory Visit (INDEPENDENT_AMBULATORY_CARE_PROVIDER_SITE_OTHER): Payer: Medicare Other | Admitting: Obstetrics and Gynecology

## 2017-02-24 ENCOUNTER — Ambulatory Visit (INDEPENDENT_AMBULATORY_CARE_PROVIDER_SITE_OTHER): Payer: Medicare Other

## 2017-02-24 VITALS — BP 112/66 | HR 71 | Wt 198.6 lb

## 2017-02-24 DIAGNOSIS — N941 Unspecified dyspareunia: Secondary | ICD-10-CM | POA: Diagnosis not present

## 2017-02-24 DIAGNOSIS — Z808 Family history of malignant neoplasm of other organs or systems: Secondary | ICD-10-CM

## 2017-02-24 DIAGNOSIS — N938 Other specified abnormal uterine and vaginal bleeding: Secondary | ICD-10-CM

## 2017-02-24 DIAGNOSIS — Z809 Family history of malignant neoplasm, unspecified: Secondary | ICD-10-CM

## 2017-02-24 DIAGNOSIS — N946 Dysmenorrhea, unspecified: Secondary | ICD-10-CM

## 2017-02-24 DIAGNOSIS — D251 Intramural leiomyoma of uterus: Secondary | ICD-10-CM

## 2017-02-24 NOTE — Progress Notes (Signed)
HPI:      [name] is a 46 y.o. G0P0000 who LMP was Patient's last menstrual period was 02/17/2017 (exact date).  Subjective:   She presents today Having stopped bleeding yesterday for her endometrial biopsy. She did have a pelvic ultrasound which revealed multiple uterine fibroids. Results for genetic testing for cancer remains pending.    Hx: The following portions of the patient's history were reviewed and updated as appropriate:             She  has a past medical history of Anxiety; CRPS (complex regional pain syndrome); Depression; Kidney stones; Migraines; PTSD (post-traumatic stress disorder); Reflex sympathetic dystrophy; and Sleep apnea. She  does not have any pertinent problems on file. She  has a past surgical history that includes Cholecystectomy; Foot surgery (Left); Kidney surgery; Breast surgery; Breast enhancement surgery (Bilateral); and Augmentation mammaplasty (Bilateral, 2005). Her family history includes Breast cancer in her mother; Cancer in her mother. She  reports that she has never smoked. She has never used smokeless tobacco. She reports that she does not drink alcohol or use drugs. She is allergic to no known allergies.       Review of Systems:  Review of Systems  Constitutional: Denied constitutional symptoms, night sweats, recent illness, fatigue, fever, insomnia and weight loss.  Eyes: Denied eye symptoms, eye pain, photophobia, vision change and visual disturbance.  Ears/Nose/Throat/Neck: Denied ear, nose, throat or neck symptoms, hearing loss, nasal discharge, sinus congestion and sore throat.  Cardiovascular: Denied cardiovascular symptoms, arrhythmia, chest pain/pressure, edema, exercise intolerance, orthopnea and palpitations.  Respiratory: Denied pulmonary symptoms, asthma, pleuritic pain, productive sputum, cough, dyspnea and wheezing.  Gastrointestinal: Denied, gastro-esophageal reflux, melena, nausea and vomiting.  Genitourinary:  Denied genitourinary symptoms including symptomatic vaginal discharge, pelvic relaxation issues, and urinary complaints.  Musculoskeletal: Denied musculoskeletal symptoms, stiffness, swelling, muscle weakness and myalgia.  Dermatologic: Denied dermatology symptoms, rash and scar.  Neurologic: Denied neurology symptoms, dizziness, headache, neck pain and syncope.  Psychiatric: Denied psychiatric symptoms, anxiety and depression.  Endocrine: Denied endocrine symptoms including hot flashes and night sweats.   Meds:   Current Outpatient Prescriptions on File Prior to Visit  Medication Sig Dispense Refill  . cetirizine (ZYRTEC) 10 MG tablet Take 10 mg by mouth daily.  5  . ciprofloxacin (CIPRO) 500 MG tablet Limit 1 tablet by mouth twice per day if tolerated 14 tablet 0  . Cyanocobalamin (VITAMIN B-12 IJ) Inject as directed once a week. Reported on 01/21/2016    . diazepam (VALIUM) 5 MG tablet Take 5 mg by mouth 4 (four) times daily.    . diclofenac sodium (VOLTAREN) 1 % GEL Apply 2-4 g to painful area of skin 4 times per day if tolerated. Patient with history of strain, sprain, and muscle spasms of extremities 500 g 0  . DULoxetine (CYMBALTA) 60 MG capsule Take 30 mg by mouth daily.     Marland Kitchen ibuprofen (ADVIL,MOTRIN) 200 MG tablet Take 200 mg by mouth every 6 (six) hours as needed.    Marland Kitchen omeprazole (PRILOSEC) 40 MG capsule TAKE 1 TABLET BY MOUTH DAILY FOR REFLUX  5  . Oxycodone HCl 10 MG TABS Limit 4-6 tablets po daily if tolerated 180 tablet 0  . pravastatin (PRAVACHOL) 20 MG tablet TAKE 1 TABLET BY MOUTH EVERY DAY FOR HIGH CHOLESTEROL  2  . prazosin (MINIPRESS) 1 MG capsule Take 2 mg by mouth at bedtime.     . propranolol ER (INDERAL LA) 60 MG 24 hr capsule  Take 60 mg by mouth daily.    . QUEtiapine (SEROQUEL) 300 MG tablet Take 300 mg by mouth at bedtime.    Marland Kitchen QUEtiapine (SEROQUEL) 400 MG tablet Take 400 mg by mouth at bedtime.  2  . tiZANidine (ZANAFLEX) 4 MG capsule Take 4 mg by mouth 3 (three)  times daily.    Marland Kitchen topiramate (TOPAMAX) 50 MG tablet Limit 1-2 tabs by mouth twice a day if tolerated 120 tablet 2  . triamcinolone (KENALOG) 0.025 % cream APPLY TO AFFECTED AREA TWICE DAILY FOR UP TO TWO WEEKS  1  . cefUROXime (CEFTIN) 250 MG tablet Take 1 tablet (250 mg total) by mouth 2 (two) times daily with a meal. (Patient not taking: Reported on 04/02/2016) 14 tablet 0  . cefUROXime (CEFTIN) 250 MG tablet Take 1 tablet (250 mg total) by mouth 2 (two) times daily with a meal. (Patient not taking: Reported on 04/02/2016) 14 tablet 0  . cefUROXime (CEFTIN) 250 MG tablet Take 1 tablet (250 mg total) by mouth 2 (two) times daily with a meal. (Patient not taking: Reported on 04/02/2016) 14 tablet 0  . cefUROXime (CEFTIN) 250 MG tablet Take 1 tablet (250 mg total) by mouth 2 (two) times daily with a meal. (Patient not taking: Reported on 04/02/2016) 14 tablet 0  . cefUROXime (CEFTIN) 250 MG tablet Take 1 tablet (250 mg total) by mouth 2 (two) times daily with a meal. (Patient not taking: Reported on 04/02/2016) 14 tablet 0  . cefUROXime (CEFTIN) 250 MG tablet Take 1 tablet (250 mg total) by mouth 2 (two) times daily with a meal. (Patient not taking: Reported on 04/02/2016) 14 tablet 0  . cefUROXime (CEFTIN) 250 MG tablet Take 1 tablet (250 mg total) by mouth 2 (two) times daily with a meal. (Patient not taking: Reported on 04/02/2016) 14 tablet 0  . cefUROXime (CEFTIN) 250 MG tablet Take 1 tablet (250 mg total) by mouth 2 (two) times daily with a meal. (Patient not taking: Reported on 05/07/2016) 14 tablet 0  . cefUROXime (CEFTIN) 250 MG tablet Take 1 tablet (250 mg total) by mouth 2 (two) times daily with a meal. (Patient not taking: Reported on 02/17/2017) 14 tablet 0  . cefUROXime (CEFTIN) 500 MG tablet Limit 1 tab by mouth twice per day if tolerated  ( NO CIPRO ) (Patient not taking: Reported on 12/12/2015) 14 tablet 0  . lidocaine (LIDODERM) 5 % Apply 1 - 2  patches to painful area of skin for 12 hours then  remove for 12 hours and  Repeat process if tolerated (Patient not taking: Reported on 02/17/2017) 60 patch 0  . lidocaine (LIDODERM) 5 % Apply 1 - 2  patches to painful area of skin for 12 hours then remove for 12 hours and  Repeat process if tolerated  Patient with history of shingles (Patient not taking: Reported on 02/17/2017) 60 patch 0  . loratadine (CLARITIN) 10 MG tablet Take 10 mg by mouth daily.    . pantoprazole (PROTONIX) 40 MG tablet Take 40 mg by mouth daily.    . sertraline (ZOLOFT) 100 MG tablet Take 100 mg by mouth 2 (two) times daily. Reported on 04/02/2016    . zolpidem (AMBIEN) 5 MG tablet Take 5 mg by mouth at bedtime as needed for sleep.     Current Facility-Administered Medications on File Prior to Visit  Medication Dose Route Frequency Provider Last Rate Last Dose  . bupivacaine (PF) (MARCAINE) 0.25 % injection 30 mL  30 mL  Other Once Mohammed Kindle, MD      . bupivacaine (PF) (MARCAINE) 0.25 % injection 30 mL  30 mL Other Once Mohammed Kindle, MD      . bupivacaine (PF) (MARCAINE) 0.25 % injection 30 mL  30 mL Other Once Mohammed Kindle, MD      . ceFAZolin (ANCEF) IVPB 1 g/50 mL premix  1 g Intravenous Once Mohammed Kindle, MD      . ceFAZolin (ANCEF) IVPB 1 g/50 mL premix  1 g Intravenous Once Mohammed Kindle, MD      . ceFAZolin (ANCEF) IVPB 1 g/50 mL premix  1 g Intravenous Once Mohammed Kindle, MD      . ceFAZolin (ANCEF) IVPB 1 g/50 mL premix  1 g Intravenous Once Mohammed Kindle, MD      . ceFAZolin (ANCEF) IVPB 1 g/50 mL premix  1 g Intravenous Once Mohammed Kindle, MD      . fentaNYL (SUBLIMAZE) injection 100 mcg  100 mcg Intravenous Once Mohammed Kindle, MD      . fentaNYL (SUBLIMAZE) injection 100 mcg  100 mcg Intravenous Once Mohammed Kindle, MD      . fentaNYL (SUBLIMAZE) injection 100 mcg  100 mcg Intravenous Once Mohammed Kindle, MD      . lactated ringers infusion 1,000 mL  1,000 mL Intravenous Continuous Mohammed Kindle, MD      . lactated ringers infusion 1,000 mL   1,000 mL Intravenous Continuous Mohammed Kindle, MD      . lactated ringers infusion 1,000 mL  1,000 mL Intravenous Continuous Mohammed Kindle, MD      . lactated ringers infusion 1,000 mL  1,000 mL Intravenous Continuous Mohammed Kindle, MD      . lactated ringers infusion 1,000 mL  1,000 mL Intravenous Continuous Mohammed Kindle, MD      . lactated ringers infusion 1,000 mL  1,000 mL Intravenous Continuous Mohammed Kindle, MD      . lactated ringers infusion 1,000 mL  1,000 mL Intravenous Continuous Mohammed Kindle, MD      . lidocaine (PF) (XYLOCAINE) 1 % injection 10 mL  10 mL Subcutaneous Once Mohammed Kindle, MD      . lidocaine (PF) (XYLOCAINE) 1 % injection 10 mL  10 mL Subcutaneous Once Mohammed Kindle, MD      . lidocaine (PF) (XYLOCAINE) 1 % injection 10 mL  10 mL Subcutaneous Once Mohammed Kindle, MD      . lidocaine (PF) (XYLOCAINE) 1 % injection 10 mL  10 mL Subcutaneous Once Mohammed Kindle, MD      . midazolam (VERSED) 5 MG/5ML injection 5 mg  5 mg Intravenous Once Mohammed Kindle, MD      . midazolam (VERSED) 5 MG/5ML injection 5 mg  5 mg Intravenous Once Mohammed Kindle, MD      . midazolam (VERSED) 5 MG/5ML injection 5 mg  5 mg Intravenous Once Mohammed Kindle, MD      . orphenadrine (NORFLEX) injection 60 mg  60 mg Intramuscular Once Mohammed Kindle, MD      . orphenadrine (NORFLEX) injection 60 mg  60 mg Intramuscular Once Mohammed Kindle, MD      . orphenadrine (NORFLEX) injection 60 mg  60 mg Intramuscular Once Mohammed Kindle, MD      . triamcinolone acetonide (KENALOG-40) injection 40 mg  40 mg Other Once Mohammed Kindle, MD      . triamcinolone acetonide (KENALOG-40) injection 40 mg  40 mg Other Once Mohammed Kindle, MD      . triamcinolone  acetonide (KENALOG-40) injection 40 mg  40 mg Other Once Mohammed Kindle, MD        Objective:     Vitals:   02/24/17 1423  BP: 112/66  Pulse: 71              Physical examination   Pelvic:   Vulva: Normal appearance.  No lesions.  Vagina:  No lesions or abnormalities noted.  Support: Normal pelvic support.  Urethra No masses tenderness or scarring.  Meatus Normal size without lesions or prolapse.  Cervix: Normal appearance.  No lesions.  Anus: Normal exam.  No lesions.  Perineum: Normal exam.  No lesions.        Bimanual   Uterus: Enlarged.  Non-tender.  Mobile.  AV.  Adnexae: No masses.  Non-tender to palpation.  Cul-de-sac: Negative for abnormality.   Endometrial Biopsy After discussion with the patient regarding her abnormal uterine bleeding I recommended that she proceed with an endometrial biopsy for further diagnosis. The risks, benefits, alternatives, and indications for an endometrial biopsy were discussed with the patient in detail. She understood the risks including infection, bleeding, cervical laceration and uterine perforation.  Verbal consent was obtained.   PROCEDURE NOTE:  Vacurette endometrial biopsy was performed using aseptic technique with iodine preparation.  The uterus was sounded to a length of 8 cm.  Adequate sampling was obtained with minimal blood loss.  The patient tolerated the procedure well.  Disposition will be pending pathology   Assessment:    G0P0000 Patient Active Problem List   Diagnosis Date Noted  . Bilateral occipital neuralgia 01/10/2016  . Cervical disc disorder with radiculopathy of cervical region 02/27/2015  . 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 DDD (degenerative disc disease), cervical with radiculopathy 01/23/2015  . CRPS 1 (complex regional pain syndrome I) of lower limb 01/23/2015  . DDD (degenerative disc disease), lumbosacral 01/23/2015     1. DUB (dysfunctional uterine bleeding)   2. Dysmenorrhea   3. Dyspareunia in female   4. Family history of cancer in mother   17. Fibroids, intramural        Plan:            1.  Await biopsy results. Discuss management at that time.       F/U  Return in about 1 week (around 03/03/2017) for We will contact her with  any abnormal test results.  Finis Bud, M.D. 02/24/2017 3:06 PM

## 2017-02-24 NOTE — Addendum Note (Signed)
Addended by: Raliegh Ip on: 02/24/2017 03:47 PM   Modules accepted: Orders

## 2017-02-26 LAB — PATHOLOGY

## 2017-03-03 ENCOUNTER — Ambulatory Visit (INDEPENDENT_AMBULATORY_CARE_PROVIDER_SITE_OTHER): Payer: Medicare Other | Admitting: Obstetrics and Gynecology

## 2017-03-03 ENCOUNTER — Encounter: Payer: Self-pay | Admitting: Obstetrics and Gynecology

## 2017-03-03 VITALS — BP 109/71 | HR 75 | Ht 67.0 in | Wt 200.2 lb

## 2017-03-03 DIAGNOSIS — N941 Unspecified dyspareunia: Secondary | ICD-10-CM | POA: Diagnosis not present

## 2017-03-03 DIAGNOSIS — D251 Intramural leiomyoma of uterus: Secondary | ICD-10-CM | POA: Diagnosis not present

## 2017-03-03 DIAGNOSIS — Z30011 Encounter for initial prescription of contraceptive pills: Secondary | ICD-10-CM

## 2017-03-03 DIAGNOSIS — N946 Dysmenorrhea, unspecified: Secondary | ICD-10-CM

## 2017-03-03 DIAGNOSIS — N938 Other specified abnormal uterine and vaginal bleeding: Secondary | ICD-10-CM | POA: Diagnosis not present

## 2017-03-03 MED ORDER — LEVONORGEST-ETH ESTRAD 91-DAY 0.15-0.03 &0.01 MG PO TABS: 1.0000 | ORAL_TABLET | Freq: Every day | ORAL | 1 refills | Status: DC

## 2017-03-03 NOTE — Progress Notes (Signed)
HPI:      [name] is a 46 y.o. G0P0000 who LMP was Patient's last menstrual period was 02/17/2017 (exact date).  Subjective:   She presents today For a follow-up of her endometrial biopsy and to discuss management of her dysfunctional bleeding and uterine fibroids. She is not currently bleeding.     Hx: The following portions of the patient's history were reviewed and updated as appropriate:             She  has a past medical history of Anxiety; CRPS (complex regional pain syndrome); Depression; Kidney stones; Migraines; PTSD (post-traumatic stress disorder); Reflex sympathetic dystrophy; and Sleep apnea. She  does not have any pertinent problems on file. She  has a past surgical history that includes Cholecystectomy; Foot surgery (Left); Kidney surgery; Breast surgery; Breast enhancement surgery (Bilateral); and Augmentation mammaplasty (Bilateral, 2005). Her family history includes Breast cancer in her mother; Cancer in her mother. She  reports that she has never smoked. She has never used smokeless tobacco. She reports that she does not drink alcohol or use drugs. She is allergic to no known allergies.       Review of Systems:  Review of Systems  Constitutional: Denied constitutional symptoms, night sweats, recent illness, fatigue, fever, insomnia and weight loss.  Eyes: Denied eye symptoms, eye pain, photophobia, vision change and visual disturbance.  Ears/Nose/Throat/Neck: Denied ear, nose, throat or neck symptoms, hearing loss, nasal discharge, sinus congestion and sore throat.  Cardiovascular: Denied cardiovascular symptoms, arrhythmia, chest pain/pressure, edema, exercise intolerance, orthopnea and palpitations.  Respiratory: Denied pulmonary symptoms, asthma, pleuritic pain, productive sputum, cough, dyspnea and wheezing.  Gastrointestinal: Denied, gastro-esophageal reflux, melena, nausea and vomiting.  Genitourinary: Denied genitourinary symptoms including  symptomatic vaginal discharge, pelvic relaxation issues, and urinary complaints.  Musculoskeletal: Denied musculoskeletal symptoms, stiffness, swelling, muscle weakness and myalgia.  Dermatologic: Denied dermatology symptoms, rash and scar.  Neurologic: Denied neurology symptoms, dizziness, headache, neck pain and syncope.  Psychiatric: Denied psychiatric symptoms, anxiety and depression.  Endocrine: Denied endocrine symptoms including hot flashes and night sweats.   Meds:   Current Outpatient Prescriptions on File Prior to Visit  Medication Sig Dispense Refill  . cefUROXime (CEFTIN) 250 MG tablet Take 1 tablet (250 mg total) by mouth 2 (two) times daily with a meal. 14 tablet 0  . cefUROXime (CEFTIN) 250 MG tablet Take 1 tablet (250 mg total) by mouth 2 (two) times daily with a meal. 14 tablet 0  . cefUROXime (CEFTIN) 250 MG tablet Take 1 tablet (250 mg total) by mouth 2 (two) times daily with a meal. 14 tablet 0  . cefUROXime (CEFTIN) 250 MG tablet Take 1 tablet (250 mg total) by mouth 2 (two) times daily with a meal. 14 tablet 0  . cefUROXime (CEFTIN) 250 MG tablet Take 1 tablet (250 mg total) by mouth 2 (two) times daily with a meal. 14 tablet 0  . cefUROXime (CEFTIN) 250 MG tablet Take 1 tablet (250 mg total) by mouth 2 (two) times daily with a meal. 14 tablet 0  . cefUROXime (CEFTIN) 250 MG tablet Take 1 tablet (250 mg total) by mouth 2 (two) times daily with a meal. 14 tablet 0  . cefUROXime (CEFTIN) 250 MG tablet Take 1 tablet (250 mg total) by mouth 2 (two) times daily with a meal. 14 tablet 0  . cefUROXime (CEFTIN) 250 MG tablet Take 1 tablet (250 mg total) by mouth 2 (two) times daily with a meal. 14 tablet 0  .  cefUROXime (CEFTIN) 500 MG tablet Limit 1 tab by mouth twice per day if tolerated  ( NO CIPRO ) 14 tablet 0  . cetirizine (ZYRTEC) 10 MG tablet Take 10 mg by mouth daily.  5  . ciprofloxacin (CIPRO) 500 MG tablet Limit 1 tablet by mouth twice per day if tolerated 14 tablet 0   . Cyanocobalamin (VITAMIN B-12 IJ) Inject as directed once a week. Reported on 01/21/2016    . diazepam (VALIUM) 5 MG tablet Take 5 mg by mouth 4 (four) times daily.    . diclofenac sodium (VOLTAREN) 1 % GEL Apply 2-4 g to painful area of skin 4 times per day if tolerated. Patient with history of strain, sprain, and muscle spasms of extremities 500 g 0  . DULoxetine (CYMBALTA) 60 MG capsule Take 30 mg by mouth daily.     Marland Kitchen ibuprofen (ADVIL,MOTRIN) 200 MG tablet Take 200 mg by mouth every 6 (six) hours as needed.    . lidocaine (LIDODERM) 5 % Apply 1 - 2  patches to painful area of skin for 12 hours then remove for 12 hours and  Repeat process if tolerated 60 patch 0  . lidocaine (LIDODERM) 5 % Apply 1 - 2  patches to painful area of skin for 12 hours then remove for 12 hours and  Repeat process if tolerated  Patient with history of shingles 60 patch 0  . loratadine (CLARITIN) 10 MG tablet Take 10 mg by mouth daily.    Marland Kitchen omeprazole (PRILOSEC) 40 MG capsule TAKE 1 TABLET BY MOUTH DAILY FOR REFLUX  5  . Oxycodone HCl 10 MG TABS Limit 4-6 tablets po daily if tolerated 180 tablet 0  . pravastatin (PRAVACHOL) 20 MG tablet TAKE 1 TABLET BY MOUTH EVERY DAY FOR HIGH CHOLESTEROL  2  . prazosin (MINIPRESS) 1 MG capsule Take 2 mg by mouth at bedtime.     . propranolol ER (INDERAL LA) 60 MG 24 hr capsule Take 60 mg by mouth daily.    . QUEtiapine (SEROQUEL) 300 MG tablet Take 300 mg by mouth at bedtime.    Marland Kitchen QUEtiapine (SEROQUEL) 400 MG tablet Take 400 mg by mouth at bedtime.  2  . tiZANidine (ZANAFLEX) 4 MG capsule Take 4 mg by mouth 3 (three) times daily.    Marland Kitchen topiramate (TOPAMAX) 50 MG tablet Limit 1-2 tabs by mouth twice a day if tolerated 120 tablet 2  . triamcinolone (KENALOG) 0.025 % cream APPLY TO AFFECTED AREA TWICE DAILY FOR UP TO TWO WEEKS  1  . zolpidem (AMBIEN) 5 MG tablet Take 5 mg by mouth at bedtime as needed for sleep.    . pantoprazole (PROTONIX) 40 MG tablet Take 40 mg by mouth daily.    .  sertraline (ZOLOFT) 100 MG tablet Take 100 mg by mouth 2 (two) times daily. Reported on 04/02/2016     Current Facility-Administered Medications on File Prior to Visit  Medication Dose Route Frequency Provider Last Rate Last Dose  . bupivacaine (PF) (MARCAINE) 0.25 % injection 30 mL  30 mL Other Once Mohammed Kindle, MD      . bupivacaine (PF) (MARCAINE) 0.25 % injection 30 mL  30 mL Other Once Mohammed Kindle, MD      . bupivacaine (PF) (MARCAINE) 0.25 % injection 30 mL  30 mL Other Once Mohammed Kindle, MD      . ceFAZolin (ANCEF) IVPB 1 g/50 mL premix  1 g Intravenous Once Mohammed Kindle, MD      .  ceFAZolin (ANCEF) IVPB 1 g/50 mL premix  1 g Intravenous Once Mohammed Kindle, MD      . ceFAZolin (ANCEF) IVPB 1 g/50 mL premix  1 g Intravenous Once Mohammed Kindle, MD      . ceFAZolin (ANCEF) IVPB 1 g/50 mL premix  1 g Intravenous Once Mohammed Kindle, MD      . ceFAZolin (ANCEF) IVPB 1 g/50 mL premix  1 g Intravenous Once Mohammed Kindle, MD      . fentaNYL (SUBLIMAZE) injection 100 mcg  100 mcg Intravenous Once Mohammed Kindle, MD      . fentaNYL (SUBLIMAZE) injection 100 mcg  100 mcg Intravenous Once Mohammed Kindle, MD      . fentaNYL (SUBLIMAZE) injection 100 mcg  100 mcg Intravenous Once Mohammed Kindle, MD      . lactated ringers infusion 1,000 mL  1,000 mL Intravenous Continuous Mohammed Kindle, MD      . lactated ringers infusion 1,000 mL  1,000 mL Intravenous Continuous Mohammed Kindle, MD      . lactated ringers infusion 1,000 mL  1,000 mL Intravenous Continuous Mohammed Kindle, MD      . lactated ringers infusion 1,000 mL  1,000 mL Intravenous Continuous Mohammed Kindle, MD      . lactated ringers infusion 1,000 mL  1,000 mL Intravenous Continuous Mohammed Kindle, MD      . lactated ringers infusion 1,000 mL  1,000 mL Intravenous Continuous Mohammed Kindle, MD      . lactated ringers infusion 1,000 mL  1,000 mL Intravenous Continuous Mohammed Kindle, MD      . lidocaine (PF) (XYLOCAINE) 1 %  injection 10 mL  10 mL Subcutaneous Once Mohammed Kindle, MD      . lidocaine (PF) (XYLOCAINE) 1 % injection 10 mL  10 mL Subcutaneous Once Mohammed Kindle, MD      . lidocaine (PF) (XYLOCAINE) 1 % injection 10 mL  10 mL Subcutaneous Once Mohammed Kindle, MD      . lidocaine (PF) (XYLOCAINE) 1 % injection 10 mL  10 mL Subcutaneous Once Mohammed Kindle, MD      . midazolam (VERSED) 5 MG/5ML injection 5 mg  5 mg Intravenous Once Mohammed Kindle, MD      . midazolam (VERSED) 5 MG/5ML injection 5 mg  5 mg Intravenous Once Mohammed Kindle, MD      . midazolam (VERSED) 5 MG/5ML injection 5 mg  5 mg Intravenous Once Mohammed Kindle, MD      . orphenadrine (NORFLEX) injection 60 mg  60 mg Intramuscular Once Mohammed Kindle, MD      . orphenadrine (NORFLEX) injection 60 mg  60 mg Intramuscular Once Mohammed Kindle, MD      . orphenadrine (NORFLEX) injection 60 mg  60 mg Intramuscular Once Mohammed Kindle, MD      . triamcinolone acetonide (KENALOG-40) injection 40 mg  40 mg Other Once Mohammed Kindle, MD      . triamcinolone acetonide (KENALOG-40) injection 40 mg  40 mg Other Once Mohammed Kindle, MD      . triamcinolone acetonide (KENALOG-40) injection 40 mg  40 mg Other Once Mohammed Kindle, MD        Objective:     Vitals:   03/03/17 1446  BP: 109/71  Pulse: 75    Endometrial biopsy results and ultrasound findings reviewed directly with the patient.            Assessment:    G0P0000 Patient Active Problem List   Diagnosis Date Noted  .  Bilateral occipital neuralgia 01/10/2016  . Cervical disc disorder with radiculopathy of cervical region 02/27/2015  . 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 DDD (degenerative disc disease), cervical with radiculopathy 01/23/2015  . CRPS 1 (complex regional pain syndrome I) of lower limb 01/23/2015  . DDD (degenerative disc disease), lumbosacral 01/23/2015     1. Dysfunctional uterine bleeding   2. Fibroids, intramural   3. Dysmenorrhea   4. Initiation of OCP  (BCP)   5. Dyspareunia in female     Her bleeding is likely secondary to her uterine fibroids as her endometrial biopsy is negative for hyperplasia or malignancy. Her pelvic pain discomfort dysmenorrhea and dyspareunia also likely to uterine fibroids.   Plan:            1.  Will attempt hormonal control of endometrium to fix her DUB and hope that her dysmenorrhea and dyspareunia also improve. Should she fail OCPs she will consider hysterectomy.   2.  OCPs The risks /benefits of OCPs have been explained to the patient in detail.  Product literature has been given to her.  I have instructed her in the use of OCPs and have given her literature reinforcing this information.  I have explained to the patient that OCPs are not as effective for birth control during the first month of use, and that another form of contraception should be used during this time.  Both first-day start and Sunday start have been explained.  The risks and benefits of each was discussed.  She has been made aware of  the fact that other medications may affect the efficacy of OCPs.  I have answered all of her questions, and I believe that she has an understanding of the effectiveness and use of OCPs.  Orders    Meds ordered this encounter  Medications  . Levonorgestrel-Ethinyl Estradiol (AMETHIA,CAMRESE) 0.15-0.03 &0.01 MG tablet    Sig: Take 1 tablet by mouth at bedtime.    Dispense:  84 tablet    Refill:  1        F/U  Return in about 3 months (around 06/03/2017). I spent 18 minutes with this patient of which greater than 50% was spent discussing uterine fibroids, pelvic pain from fibroids, management of fibroids and dysfunctional bleeding, OCP use, possible hysterectomy.  Finis Bud, M.D. 03/03/2017 3:28 PM

## 2017-03-31 NOTE — Telephone Encounter (Signed)
NA

## 2017-04-22 ENCOUNTER — Telehealth: Payer: Self-pay | Admitting: Obstetrics and Gynecology

## 2017-04-22 NOTE — Telephone Encounter (Signed)
After period stopped bleeding the 1st month with minimal spotting - Patient is into 2 1/2 weeks of having a period - a lot of cramping - sweating and pain doubles her over -   Please call

## 2017-04-28 ENCOUNTER — Encounter: Payer: Self-pay | Admitting: Obstetrics and Gynecology

## 2017-04-28 ENCOUNTER — Ambulatory Visit (INDEPENDENT_AMBULATORY_CARE_PROVIDER_SITE_OTHER): Payer: Medicare Other | Admitting: Obstetrics and Gynecology

## 2017-04-28 VITALS — BP 102/69 | HR 80 | Ht 67.0 in | Wt 191.3 lb

## 2017-04-28 DIAGNOSIS — N938 Other specified abnormal uterine and vaginal bleeding: Secondary | ICD-10-CM | POA: Diagnosis not present

## 2017-04-28 DIAGNOSIS — N921 Excessive and frequent menstruation with irregular cycle: Secondary | ICD-10-CM

## 2017-04-28 DIAGNOSIS — D251 Intramural leiomyoma of uterus: Secondary | ICD-10-CM

## 2017-04-28 DIAGNOSIS — N946 Dysmenorrhea, unspecified: Secondary | ICD-10-CM | POA: Diagnosis not present

## 2017-04-28 MED ORDER — NORETHINDRONE ACETATE 5 MG PO TABS: 5.0000 mg | ORAL_TABLET | Freq: Two times a day (BID) | ORAL | 0 refills | Status: DC

## 2017-04-28 NOTE — Progress Notes (Signed)
HPI:      [name] is a 46 y.o. G0P0000 who LMP was Patient's last menstrual period was 03/03/2017.  Subjective:   She presents today Stating that she has been bleeding off and on for the last 20 days. She is taking OCPs correctly. She is on a 3 month pack. Her bleeding has occurred during the active pills in the middle of her pack. Her "worst problem" is the crampy pain associated with the bleeding.    Hx: The following portions of the patient's history were reviewed and updated as appropriate:             She  has a past medical history of Anxiety; CRPS (complex regional pain syndrome); Depression; Kidney stones; Migraines; PTSD (post-traumatic stress disorder); Reflex sympathetic dystrophy; and Sleep apnea. She  does not have any pertinent problems on file. She  has a past surgical history that includes Cholecystectomy; Foot surgery (Left); Kidney surgery; Breast surgery; Breast enhancement surgery (Bilateral); and Augmentation mammaplasty (Bilateral, 2005). Her family history includes Breast cancer in her mother; Cancer in her mother. She  reports that she has never smoked. She has never used smokeless tobacco. She reports that she does not drink alcohol or use drugs. She is allergic to no known allergies.       Review of Systems:  Review of Systems  Constitutional: Denied constitutional symptoms, night sweats, recent illness, fatigue, fever, insomnia and weight loss.  Eyes: Denied eye symptoms, eye pain, photophobia, vision change and visual disturbance.  Ears/Nose/Throat/Neck: Denied ear, nose, throat or neck symptoms, hearing loss, nasal discharge, sinus congestion and sore throat.  Cardiovascular: Denied cardiovascular symptoms, arrhythmia, chest pain/pressure, edema, exercise intolerance, orthopnea and palpitations.  Respiratory: Denied pulmonary symptoms, asthma, pleuritic pain, productive sputum, cough, dyspnea and wheezing.  Gastrointestinal: Denied,  gastro-esophageal reflux, melena, nausea and vomiting.  Genitourinary: See HPI for additional information.  Musculoskeletal: Denied musculoskeletal symptoms, stiffness, swelling, muscle weakness and myalgia.  Dermatologic: Denied dermatology symptoms, rash and scar.  Neurologic: Denied neurology symptoms, dizziness, headache, neck pain and syncope.  Psychiatric: Denied psychiatric symptoms, anxiety and depression.  Endocrine: Denied endocrine symptoms including hot flashes and night sweats.   Meds:   Current Outpatient Prescriptions on File Prior to Visit  Medication Sig Dispense Refill  . cefUROXime (CEFTIN) 500 MG tablet Limit 1 tab by mouth twice per day if tolerated  ( NO CIPRO ) 14 tablet 0  . cetirizine (ZYRTEC) 10 MG tablet Take 10 mg by mouth daily.  5  . diazepam (VALIUM) 5 MG tablet Take 5 mg by mouth 4 (four) times daily.    . diclofenac sodium (VOLTAREN) 1 % GEL Apply 2-4 g to painful area of skin 4 times per day if tolerated. Patient with history of strain, sprain, and muscle spasms of extremities 500 g 0  . DULoxetine (CYMBALTA) 60 MG capsule Take 30 mg by mouth daily.     Marland Kitchen ibuprofen (ADVIL,MOTRIN) 200 MG tablet Take 200 mg by mouth every 6 (six) hours as needed.    . Levonorgestrel-Ethinyl Estradiol (AMETHIA,CAMRESE) 0.15-0.03 &0.01 MG tablet Take 1 tablet by mouth at bedtime. 84 tablet 1  . lidocaine (LIDODERM) 5 % Apply 1 - 2  patches to painful area of skin for 12 hours then remove for 12 hours and  Repeat process if tolerated 60 patch 0  . omeprazole (PRILOSEC) 40 MG capsule TAKE 1 TABLET BY MOUTH DAILY FOR REFLUX  5  . Oxycodone HCl 10 MG TABS Limit 4-6  tablets po daily if tolerated 180 tablet 0  . pravastatin (PRAVACHOL) 20 MG tablet TAKE 1 TABLET BY MOUTH EVERY DAY FOR HIGH CHOLESTEROL  2  . prazosin (MINIPRESS) 1 MG capsule Take 2 mg by mouth at bedtime.     . propranolol ER (INDERAL LA) 60 MG 24 hr capsule Take 60 mg by mouth daily.    . QUEtiapine (SEROQUEL) 400 MG  tablet Take 400 mg by mouth at bedtime.  2  . tiZANidine (ZANAFLEX) 4 MG capsule Take 4 mg by mouth 3 (three) times daily.    Marland Kitchen topiramate (TOPAMAX) 50 MG tablet Limit 1-2 tabs by mouth twice a day if tolerated 120 tablet 2  . triamcinolone (KENALOG) 0.025 % cream APPLY TO AFFECTED AREA TWICE DAILY FOR UP TO TWO WEEKS  1  . zolpidem (AMBIEN) 5 MG tablet Take 5 mg by mouth at bedtime as needed for sleep.    . cefUROXime (CEFTIN) 250 MG tablet Take 1 tablet (250 mg total) by mouth 2 (two) times daily with a meal. (Patient not taking: Reported on 04/28/2017) 14 tablet 0  . cefUROXime (CEFTIN) 250 MG tablet Take 1 tablet (250 mg total) by mouth 2 (two) times daily with a meal. (Patient not taking: Reported on 04/28/2017) 14 tablet 0  . cefUROXime (CEFTIN) 250 MG tablet Take 1 tablet (250 mg total) by mouth 2 (two) times daily with a meal. (Patient not taking: Reported on 04/28/2017) 14 tablet 0  . cefUROXime (CEFTIN) 250 MG tablet Take 1 tablet (250 mg total) by mouth 2 (two) times daily with a meal. (Patient not taking: Reported on 04/28/2017) 14 tablet 0  . cefUROXime (CEFTIN) 250 MG tablet Take 1 tablet (250 mg total) by mouth 2 (two) times daily with a meal. (Patient not taking: Reported on 04/28/2017) 14 tablet 0  . cefUROXime (CEFTIN) 250 MG tablet Take 1 tablet (250 mg total) by mouth 2 (two) times daily with a meal. (Patient not taking: Reported on 04/28/2017) 14 tablet 0  . cefUROXime (CEFTIN) 250 MG tablet Take 1 tablet (250 mg total) by mouth 2 (two) times daily with a meal. (Patient not taking: Reported on 04/28/2017) 14 tablet 0  . cefUROXime (CEFTIN) 250 MG tablet Take 1 tablet (250 mg total) by mouth 2 (two) times daily with a meal. (Patient not taking: Reported on 04/28/2017) 14 tablet 0  . cefUROXime (CEFTIN) 250 MG tablet Take 1 tablet (250 mg total) by mouth 2 (two) times daily with a meal. (Patient not taking: Reported on 04/28/2017) 14 tablet 0  . ciprofloxacin (CIPRO) 500 MG tablet Limit 1 tablet by  mouth twice per day if tolerated (Patient not taking: Reported on 04/28/2017) 14 tablet 0  . Cyanocobalamin (VITAMIN B-12 IJ) Inject as directed once a week. Reported on 01/21/2016    . lidocaine (LIDODERM) 5 % Apply 1 - 2  patches to painful area of skin for 12 hours then remove for 12 hours and  Repeat process if tolerated  Patient with history of shingles (Patient not taking: Reported on 04/28/2017) 60 patch 0  . loratadine (CLARITIN) 10 MG tablet Take 10 mg by mouth daily.    . pantoprazole (PROTONIX) 40 MG tablet Take 40 mg by mouth daily.    . QUEtiapine (SEROQUEL) 300 MG tablet Take 300 mg by mouth at bedtime.    . sertraline (ZOLOFT) 100 MG tablet Take 100 mg by mouth 2 (two) times daily. Reported on 04/02/2016     Current Facility-Administered Medications on  File Prior to Visit  Medication Dose Route Frequency Provider Last Rate Last Dose  . bupivacaine (PF) (MARCAINE) 0.25 % injection 30 mL  30 mL Other Once Mohammed Kindle, MD      . bupivacaine (PF) (MARCAINE) 0.25 % injection 30 mL  30 mL Other Once Mohammed Kindle, MD      . bupivacaine (PF) (MARCAINE) 0.25 % injection 30 mL  30 mL Other Once Mohammed Kindle, MD      . ceFAZolin (ANCEF) IVPB 1 g/50 mL premix  1 g Intravenous Once Mohammed Kindle, MD      . ceFAZolin (ANCEF) IVPB 1 g/50 mL premix  1 g Intravenous Once Mohammed Kindle, MD      . ceFAZolin (ANCEF) IVPB 1 g/50 mL premix  1 g Intravenous Once Mohammed Kindle, MD      . ceFAZolin (ANCEF) IVPB 1 g/50 mL premix  1 g Intravenous Once Mohammed Kindle, MD      . ceFAZolin (ANCEF) IVPB 1 g/50 mL premix  1 g Intravenous Once Mohammed Kindle, MD      . fentaNYL (SUBLIMAZE) injection 100 mcg  100 mcg Intravenous Once Mohammed Kindle, MD      . fentaNYL (SUBLIMAZE) injection 100 mcg  100 mcg Intravenous Once Mohammed Kindle, MD      . fentaNYL (SUBLIMAZE) injection 100 mcg  100 mcg Intravenous Once Mohammed Kindle, MD      . lactated ringers infusion 1,000 mL  1,000 mL Intravenous Continuous Mohammed Kindle, MD      . lactated ringers infusion 1,000 mL  1,000 mL Intravenous Continuous Mohammed Kindle, MD      . lactated ringers infusion 1,000 mL  1,000 mL Intravenous Continuous Mohammed Kindle, MD      . lactated ringers infusion 1,000 mL  1,000 mL Intravenous Continuous Mohammed Kindle, MD      . lactated ringers infusion 1,000 mL  1,000 mL Intravenous Continuous Mohammed Kindle, MD      . lactated ringers infusion 1,000 mL  1,000 mL Intravenous Continuous Mohammed Kindle, MD      . lactated ringers infusion 1,000 mL  1,000 mL Intravenous Continuous Mohammed Kindle, MD      . lidocaine (PF) (XYLOCAINE) 1 % injection 10 mL  10 mL Subcutaneous Once Mohammed Kindle, MD      . lidocaine (PF) (XYLOCAINE) 1 % injection 10 mL  10 mL Subcutaneous Once Mohammed Kindle, MD      . lidocaine (PF) (XYLOCAINE) 1 % injection 10 mL  10 mL Subcutaneous Once Mohammed Kindle, MD      . lidocaine (PF) (XYLOCAINE) 1 % injection 10 mL  10 mL Subcutaneous Once Mohammed Kindle, MD      . midazolam (VERSED) 5 MG/5ML injection 5 mg  5 mg Intravenous Once Mohammed Kindle, MD      . midazolam (VERSED) 5 MG/5ML injection 5 mg  5 mg Intravenous Once Mohammed Kindle, MD      . midazolam (VERSED) 5 MG/5ML injection 5 mg  5 mg Intravenous Once Mohammed Kindle, MD      . orphenadrine (NORFLEX) injection 60 mg  60 mg Intramuscular Once Mohammed Kindle, MD      . orphenadrine (NORFLEX) injection 60 mg  60 mg Intramuscular Once Mohammed Kindle, MD      . orphenadrine (NORFLEX) injection 60 mg  60 mg Intramuscular Once Mohammed Kindle, MD      . triamcinolone acetonide (KENALOG-40) injection 40 mg  40 mg Other Once Mohammed Kindle, MD      .  triamcinolone acetonide (KENALOG-40) injection 40 mg  40 mg Other Once Mohammed Kindle, MD      . triamcinolone acetonide (KENALOG-40) injection 40 mg  40 mg Other Once Mohammed Kindle, MD        Objective:     Vitals:   04/28/17 0838  BP: 102/69  Pulse: 80                Assessment:     G0P0000 Patient Active Problem List   Diagnosis Date Noted  . Bilateral occipital neuralgia 01/10/2016  . Cervical disc disorder with radiculopathy of cervical region 02/27/2015  . 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 DDD (degenerative disc disease), cervical with radiculopathy 01/23/2015  . CRPS 1 (complex regional pain syndrome I) of lower limb 01/23/2015  . DDD (degenerative disc disease), lumbosacral 01/23/2015     1. Dysfunctional uterine bleeding   2. Fibroids, intramural   3. Dysmenorrhea   4. Breakthrough bleeding on birth control pills     Patient having breakthrough bleeding on OCPs. Likely have not gotten control of the endometrium.   Plan:            1.  We'll prescribe Aygestin for the remainder of the pack to control her bleeding. Then I expect her next 3 month pack to be significantly better with cycle control. We have discussed this in detail. Should this fail we have discussed the option IUD or surgery. Orders No orders of the defined types were placed in this encounter.    Meds ordered this encounter  Medications  . norethindrone (AYGESTIN) 5 MG tablet    Sig: Take 1 tablet (5 mg total) by mouth 2 (two) times daily. Then decrease to once daily as directed.    Dispense:  42 tablet    Refill:  0        F/U  Return for As scheduled. I spent 18 minutes with this patient of which greater than 50% was spent discussing breakthrough bleeding, uterine fibroids, future options as listed above.  Finis Bud, M.D. 04/28/2017 9:18 AM

## 2017-05-26 ENCOUNTER — Encounter: Payer: Self-pay | Admitting: Obstetrics and Gynecology

## 2017-05-26 ENCOUNTER — Ambulatory Visit (INDEPENDENT_AMBULATORY_CARE_PROVIDER_SITE_OTHER): Payer: Medicare Other | Admitting: Obstetrics and Gynecology

## 2017-05-26 VITALS — BP 134/90 | HR 101 | Ht 67.0 in | Wt 195.0 lb

## 2017-05-26 DIAGNOSIS — N946 Dysmenorrhea, unspecified: Secondary | ICD-10-CM | POA: Diagnosis not present

## 2017-05-26 DIAGNOSIS — N951 Menopausal and female climacteric states: Secondary | ICD-10-CM | POA: Diagnosis not present

## 2017-05-26 DIAGNOSIS — N938 Other specified abnormal uterine and vaginal bleeding: Secondary | ICD-10-CM

## 2017-05-26 DIAGNOSIS — D251 Intramural leiomyoma of uterus: Secondary | ICD-10-CM

## 2017-05-26 NOTE — Progress Notes (Signed)
HPI:      [name] is a 46 y.o. G0P0000 who LMP was No LMP recorded. Patient is not currently having periods (Reason: Irregular Periods).  Subjective:   She presents today Stating that her bleeding has stopped and her cycle seems to now be regulated. She has completed her Aygestin and is on the last week of her 3 month pack of OCPs (first pack).  She states that her pelvic pain has not stopped however. She continues to have crampy type pelvic pain even when she is not bleeding. She has missed lifetime events because of this pain.  She is also complaining of hot flashes. These do not occur while she is on active OCPs, but during her inactive week she has experienced them.    Hx: The following portions of the patient's history were reviewed and updated as appropriate:             She  has a past medical history of Anxiety; CRPS (complex regional pain syndrome); Depression; Kidney stones; Migraines; PTSD (post-traumatic stress disorder); Reflex sympathetic dystrophy; and Sleep apnea. She  does not have any pertinent problems on file. She  has a past surgical history that includes Cholecystectomy; Foot surgery (Left); Kidney surgery; Breast surgery; Breast enhancement surgery (Bilateral); and Augmentation mammaplasty (Bilateral, 2005). Her family history includes Breast cancer in her mother; Cancer in her mother. She  reports that she has never smoked. She has never used smokeless tobacco. She reports that she does not drink alcohol or use drugs. She is allergic to no known allergies.       Review of Systems:  Review of Systems  Constitutional: Denied constitutional symptoms, night sweats, recent illness, fatigue, fever, insomnia and weight loss.  Eyes: Denied eye symptoms, eye pain, photophobia, vision change and visual disturbance.  Ears/Nose/Throat/Neck: Denied ear, nose, throat or neck symptoms, hearing loss, nasal discharge, sinus congestion and sore throat.  Cardiovascular:  Denied cardiovascular symptoms, arrhythmia, chest pain/pressure, edema, exercise intolerance, orthopnea and palpitations.  Respiratory: Denied pulmonary symptoms, asthma, pleuritic pain, productive sputum, cough, dyspnea and wheezing.  Gastrointestinal: Denied, gastro-esophageal reflux, melena, nausea and vomiting.  Genitourinary: See HPI for additional information.  Musculoskeletal: Denied musculoskeletal symptoms, stiffness, swelling, muscle weakness and myalgia.  Dermatologic: Denied dermatology symptoms, rash and scar.  Neurologic: Denied neurology symptoms, dizziness, headache, neck pain and syncope.  Psychiatric: Denied psychiatric symptoms, anxiety and depression.  Endocrine: See HPI for additional information.   Meds:   Current Outpatient Prescriptions on File Prior to Visit  Medication Sig Dispense Refill  . cefUROXime (CEFTIN) 250 MG tablet Take 1 tablet (250 mg total) by mouth 2 (two) times daily with a meal. 14 tablet 0  . cefUROXime (CEFTIN) 250 MG tablet Take 1 tablet (250 mg total) by mouth 2 (two) times daily with a meal. 14 tablet 0  . cefUROXime (CEFTIN) 250 MG tablet Take 1 tablet (250 mg total) by mouth 2 (two) times daily with a meal. 14 tablet 0  . cefUROXime (CEFTIN) 250 MG tablet Take 1 tablet (250 mg total) by mouth 2 (two) times daily with a meal. 14 tablet 0  . cefUROXime (CEFTIN) 250 MG tablet Take 1 tablet (250 mg total) by mouth 2 (two) times daily with a meal. 14 tablet 0  . cefUROXime (CEFTIN) 250 MG tablet Take 1 tablet (250 mg total) by mouth 2 (two) times daily with a meal. 14 tablet 0  . cefUROXime (CEFTIN) 250 MG tablet Take 1 tablet (250 mg total)  by mouth 2 (two) times daily with a meal. 14 tablet 0  . cefUROXime (CEFTIN) 250 MG tablet Take 1 tablet (250 mg total) by mouth 2 (two) times daily with a meal. 14 tablet 0  . cefUROXime (CEFTIN) 250 MG tablet Take 1 tablet (250 mg total) by mouth 2 (two) times daily with a meal. 14 tablet 0  . cefUROXime  (CEFTIN) 500 MG tablet Limit 1 tab by mouth twice per day if tolerated  ( NO CIPRO ) 14 tablet 0  . cetirizine (ZYRTEC) 10 MG tablet Take 10 mg by mouth daily.  5  . ciprofloxacin (CIPRO) 500 MG tablet Limit 1 tablet by mouth twice per day if tolerated 14 tablet 0  . Cyanocobalamin (VITAMIN B-12 IJ) Inject as directed once a week. Reported on 01/21/2016    . diazepam (VALIUM) 5 MG tablet Take 5 mg by mouth 4 (four) times daily.    . diclofenac sodium (VOLTAREN) 1 % GEL Apply 2-4 g to painful area of skin 4 times per day if tolerated. Patient with history of strain, sprain, and muscle spasms of extremities 500 g 0  . DULoxetine (CYMBALTA) 60 MG capsule Take 30 mg by mouth daily.     Marland Kitchen ibuprofen (ADVIL,MOTRIN) 200 MG tablet Take 200 mg by mouth every 6 (six) hours as needed.    . Levonorgestrel-Ethinyl Estradiol (AMETHIA,CAMRESE) 0.15-0.03 &0.01 MG tablet Take 1 tablet by mouth at bedtime. 84 tablet 1  . lidocaine (LIDODERM) 5 % Apply 1 - 2  patches to painful area of skin for 12 hours then remove for 12 hours and  Repeat process if tolerated 60 patch 0  . lidocaine (LIDODERM) 5 % Apply 1 - 2  patches to painful area of skin for 12 hours then remove for 12 hours and  Repeat process if tolerated  Patient with history of shingles 60 patch 0  . loratadine (CLARITIN) 10 MG tablet Take 10 mg by mouth daily.    Marland Kitchen omeprazole (PRILOSEC) 40 MG capsule TAKE 1 TABLET BY MOUTH DAILY FOR REFLUX  5  . Oxycodone HCl 10 MG TABS Limit 4-6 tablets po daily if tolerated 180 tablet 0  . pantoprazole (PROTONIX) 40 MG tablet Take 40 mg by mouth daily.    . pravastatin (PRAVACHOL) 20 MG tablet TAKE 1 TABLET BY MOUTH EVERY DAY FOR HIGH CHOLESTEROL  2  . prazosin (MINIPRESS) 1 MG capsule Take 2 mg by mouth at bedtime.     . propranolol ER (INDERAL LA) 60 MG 24 hr capsule Take 60 mg by mouth daily.    . QUEtiapine (SEROQUEL) 300 MG tablet Take 300 mg by mouth at bedtime.    Marland Kitchen QUEtiapine (SEROQUEL) 400 MG tablet Take 400 mg by  mouth at bedtime.  2  . sertraline (ZOLOFT) 100 MG tablet Take 100 mg by mouth 2 (two) times daily. Reported on 04/02/2016    . tiZANidine (ZANAFLEX) 4 MG capsule Take 4 mg by mouth 3 (three) times daily.    Marland Kitchen topiramate (TOPAMAX) 50 MG tablet Limit 1-2 tabs by mouth twice a day if tolerated 120 tablet 2  . triamcinolone (KENALOG) 0.025 % cream APPLY TO AFFECTED AREA TWICE DAILY FOR UP TO TWO WEEKS  1  . zolpidem (AMBIEN) 5 MG tablet Take 5 mg by mouth at bedtime as needed for sleep.    . norethindrone (AYGESTIN) 5 MG tablet Take 1 tablet (5 mg total) by mouth 2 (two) times daily. Then decrease to once daily as directed.  42 tablet 0   Current Facility-Administered Medications on File Prior to Visit  Medication Dose Route Frequency Provider Last Rate Last Dose  . bupivacaine (PF) (MARCAINE) 0.25 % injection 30 mL  30 mL Other Once Mohammed Kindle, MD      . bupivacaine (PF) (MARCAINE) 0.25 % injection 30 mL  30 mL Other Once Mohammed Kindle, MD      . bupivacaine (PF) (MARCAINE) 0.25 % injection 30 mL  30 mL Other Once Mohammed Kindle, MD      . ceFAZolin (ANCEF) IVPB 1 g/50 mL premix  1 g Intravenous Once Mohammed Kindle, MD      . ceFAZolin (ANCEF) IVPB 1 g/50 mL premix  1 g Intravenous Once Mohammed Kindle, MD      . ceFAZolin (ANCEF) IVPB 1 g/50 mL premix  1 g Intravenous Once Mohammed Kindle, MD      . ceFAZolin (ANCEF) IVPB 1 g/50 mL premix  1 g Intravenous Once Mohammed Kindle, MD      . ceFAZolin (ANCEF) IVPB 1 g/50 mL premix  1 g Intravenous Once Mohammed Kindle, MD      . fentaNYL (SUBLIMAZE) injection 100 mcg  100 mcg Intravenous Once Mohammed Kindle, MD      . fentaNYL (SUBLIMAZE) injection 100 mcg  100 mcg Intravenous Once Mohammed Kindle, MD      . fentaNYL (SUBLIMAZE) injection 100 mcg  100 mcg Intravenous Once Mohammed Kindle, MD      . lactated ringers infusion 1,000 mL  1,000 mL Intravenous Continuous Mohammed Kindle, MD      . lactated ringers infusion 1,000 mL  1,000 mL Intravenous  Continuous Mohammed Kindle, MD      . lactated ringers infusion 1,000 mL  1,000 mL Intravenous Continuous Mohammed Kindle, MD      . lactated ringers infusion 1,000 mL  1,000 mL Intravenous Continuous Mohammed Kindle, MD      . lactated ringers infusion 1,000 mL  1,000 mL Intravenous Continuous Mohammed Kindle, MD      . lactated ringers infusion 1,000 mL  1,000 mL Intravenous Continuous Mohammed Kindle, MD      . lactated ringers infusion 1,000 mL  1,000 mL Intravenous Continuous Mohammed Kindle, MD      . lidocaine (PF) (XYLOCAINE) 1 % injection 10 mL  10 mL Subcutaneous Once Mohammed Kindle, MD      . lidocaine (PF) (XYLOCAINE) 1 % injection 10 mL  10 mL Subcutaneous Once Mohammed Kindle, MD      . lidocaine (PF) (XYLOCAINE) 1 % injection 10 mL  10 mL Subcutaneous Once Mohammed Kindle, MD      . lidocaine (PF) (XYLOCAINE) 1 % injection 10 mL  10 mL Subcutaneous Once Mohammed Kindle, MD      . midazolam (VERSED) 5 MG/5ML injection 5 mg  5 mg Intravenous Once Mohammed Kindle, MD      . midazolam (VERSED) 5 MG/5ML injection 5 mg  5 mg Intravenous Once Mohammed Kindle, MD      . midazolam (VERSED) 5 MG/5ML injection 5 mg  5 mg Intravenous Once Mohammed Kindle, MD      . orphenadrine (NORFLEX) injection 60 mg  60 mg Intramuscular Once Mohammed Kindle, MD      . orphenadrine (NORFLEX) injection 60 mg  60 mg Intramuscular Once Mohammed Kindle, MD      . orphenadrine (NORFLEX) injection 60 mg  60 mg Intramuscular Once Mohammed Kindle, MD      . triamcinolone acetonide [facility]) injection 40  mg  40 mg Other Once Mohammed Kindle, MD      . triamcinolone acetonide (KENALOG-40) injection 40 mg  40 mg Other Once Mohammed Kindle, MD      . triamcinolone acetonide (KENALOG-40) injection 40 mg  40 mg Other Once Mohammed Kindle, MD        Objective:     Vitals:   05/26/17 1505  BP: 134/90  Pulse: (!) 101                Assessment:    G0P0000 Patient Active Problem List   Diagnosis Date Noted  . Bilateral  occipital neuralgia 01/10/2016  . Cervical disc disorder with radiculopathy of cervical region 02/27/2015  . 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 DDD (degenerative disc disease), cervical with radiculopathy 01/23/2015  . CRPS 1 (complex regional pain syndrome I) of lower limb 01/23/2015  . DDD (degenerative disc disease), lumbosacral 01/23/2015     1. Dysfunctional uterine bleeding   2. Fibroids, intramural   3. Dysmenorrhea   4. Symptomatic menopausal or female climacteric states     Bleeding now controlled-but patient continues having irregular pelvic cramping and pain.   Plan:            1.  I expect the patient is now that she is in her second pack of OCPs her bleeding and cramping should be reduced and controlled.  2.  She is having climacteric/menopausal symptoms. This may be a good sign because if she is truly entering menopause discontinuation of all medication should result in the shrinking of her fibroid and in her cessation of menstrual bleeding.  3.  Planned follow-up in 4 months and determination of future management at that time based on her symptoms. Orders No orders of the defined types were placed in this encounter.   No orders of the defined types were placed in this encounter.     F/U  Return in about 4 months (around 09/25/2017). I spent 17 minutes with this patient of which greater than 50% was spent discussing dysfunctional bleeding, pelvic pain, use of OCPs, menopause and its effect on fibroids and bleeding.  Finis Bud, M.D. 05/26/2017 3:59 PM

## 2017-07-10 ENCOUNTER — Emergency Department: Payer: Medicare Other

## 2017-07-10 ENCOUNTER — Encounter: Payer: Self-pay | Admitting: Emergency Medicine

## 2017-07-10 ENCOUNTER — Observation Stay
Admission: EM | Admit: 2017-07-10 | Discharge: 2017-07-11 | Disposition: A | Discharge status: Home / Self Care | Payer: Medicare Other | Attending: Internal Medicine | Admitting: Internal Medicine

## 2017-07-10 DIAGNOSIS — M5010 Cervical disc disorder with radiculopathy, unspecified cervical region: Secondary | ICD-10-CM | POA: Insufficient documentation

## 2017-07-10 DIAGNOSIS — F331 Major depressive disorder, recurrent, moderate: Secondary | ICD-10-CM

## 2017-07-10 DIAGNOSIS — M5481 Occipital neuralgia: Secondary | ICD-10-CM | POA: Insufficient documentation

## 2017-07-10 DIAGNOSIS — Z79891 Long term (current) use of opiate analgesic: Secondary | ICD-10-CM | POA: Insufficient documentation

## 2017-07-10 DIAGNOSIS — Z8249 Family history of ischemic heart disease and other diseases of the circulatory system: Secondary | ICD-10-CM | POA: Insufficient documentation

## 2017-07-10 DIAGNOSIS — R079 Chest pain, unspecified: Secondary | ICD-10-CM | POA: Diagnosis present

## 2017-07-10 DIAGNOSIS — R9431 Abnormal electrocardiogram [ECG] [EKG]: Secondary | ICD-10-CM | POA: Insufficient documentation

## 2017-07-10 DIAGNOSIS — E7800 Pure hypercholesterolemia, unspecified: Secondary | ICD-10-CM | POA: Insufficient documentation

## 2017-07-10 DIAGNOSIS — F4310 Post-traumatic stress disorder, unspecified: Secondary | ICD-10-CM | POA: Insufficient documentation

## 2017-07-10 DIAGNOSIS — G4730 Sleep apnea, unspecified: Secondary | ICD-10-CM | POA: Diagnosis not present

## 2017-07-10 DIAGNOSIS — F418 Other specified anxiety disorders: Secondary | ICD-10-CM | POA: Diagnosis not present

## 2017-07-10 DIAGNOSIS — M5137 Other intervertebral disc degeneration, lumbosacral region: Secondary | ICD-10-CM | POA: Insufficient documentation

## 2017-07-10 DIAGNOSIS — R112 Nausea with vomiting, unspecified: Secondary | ICD-10-CM | POA: Insufficient documentation

## 2017-07-10 DIAGNOSIS — G894 Chronic pain syndrome: Secondary | ICD-10-CM | POA: Diagnosis not present

## 2017-07-10 DIAGNOSIS — G9050 Complex regional pain syndrome I, unspecified: Secondary | ICD-10-CM | POA: Insufficient documentation

## 2017-07-10 DIAGNOSIS — G43909 Migraine, unspecified, not intractable, without status migrainosus: Secondary | ICD-10-CM | POA: Insufficient documentation

## 2017-07-10 DIAGNOSIS — F329 Major depressive disorder, single episode, unspecified: Secondary | ICD-10-CM | POA: Diagnosis not present

## 2017-07-10 LAB — COMPREHENSIVE METABOLIC PANEL
ALT: 8 U/L — AB (ref 14–54)
AST: 16 U/L (ref 15–41)
Albumin: 3.8 g/dL (ref 3.5–5.0)
Alkaline Phosphatase: 42 U/L (ref 38–126)
Anion gap: 10 (ref 5–15)
BUN: 8 mg/dL (ref 6–20)
CHLORIDE: 109 mmol/L (ref 101–111)
CO2: 21 mmol/L — AB (ref 22–32)
CREATININE: 1.14 mg/dL — AB (ref 0.44–1.00)
Calcium: 8.2 mg/dL — ABNORMAL LOW (ref 8.9–10.3)
GFR calc non Af Amer: 56 mL/min — ABNORMAL LOW (ref >60)
Glucose, Bld: 111 mg/dL — ABNORMAL HIGH (ref 65–99)
POTASSIUM: 3.1 mmol/L — AB (ref 3.5–5.1)
SODIUM: 140 mmol/L (ref 135–145)
Total Bilirubin: 0.6 mg/dL (ref 0.3–1.2)
Total Protein: 7.2 g/dL (ref 6.5–8.1)

## 2017-07-10 LAB — CBC
HCT: 40.3 % (ref 35.0–47.0)
Hemoglobin: 13.5 g/dL (ref 12.0–16.0)
MCH: 29.3 pg (ref 26.0–34.0)
MCHC: 33.5 g/dL (ref 32.0–36.0)
MCV: 87.4 fL (ref 80.0–100.0)
PLATELETS: 270 10*3/uL (ref 150–440)
RBC: 4.61 MIL/uL (ref 3.80–5.20)
RDW: 13.3 % (ref 11.5–14.5)
WBC: 9.1 10*3/uL (ref 3.6–11.0)

## 2017-07-10 LAB — TROPONIN I
Troponin I: <0.03 ng/mL (ref <0.03)
Troponin I: <0.03 ng/mL (ref <0.03)

## 2017-07-10 MED ORDER — METOPROLOL TARTRATE 25 MG PO TABS
25.0000 mg | ORAL_TABLET | Freq: Two times a day (BID) | ORAL | Status: DC
  Administered 2017-07-10 – 2017-07-11 (×2): 25 mg via ORAL
  Filled 2017-07-10 (×2): qty 1

## 2017-07-10 MED ORDER — ONDANSETRON HCL 4 MG/2ML IJ SOLN
4.0000 mg | Freq: Four times a day (QID) | INTRAMUSCULAR | Status: DC | PRN
  Administered 2017-07-10 – 2017-07-11 (×3): 4 mg via INTRAVENOUS
  Filled 2017-07-10 (×3): qty 2

## 2017-07-10 MED ORDER — ASPIRIN 81 MG PO CHEW
81.0000 mg | CHEWABLE_TABLET | Freq: Every day | ORAL | Status: DC
  Administered 2017-07-10 – 2017-07-11 (×2): 81 mg via ORAL
  Filled 2017-07-10 (×2): qty 1

## 2017-07-10 MED ORDER — QUETIAPINE FUMARATE 200 MG PO TABS
400.0000 mg | ORAL_TABLET | Freq: Every day | ORAL | Status: DC
  Administered 2017-07-10: 400 mg via ORAL
  Filled 2017-07-10 (×2): qty 2

## 2017-07-10 MED ORDER — ACETAMINOPHEN 325 MG PO TABS: 650.0000 mg | ORAL_TABLET | Freq: Four times a day (QID) | ORAL | Status: DC | PRN

## 2017-07-10 MED ORDER — TIZANIDINE HCL 4 MG PO TABS
6.0000 mg | ORAL_TABLET | Freq: Three times a day (TID) | ORAL | Status: DC | PRN
  Administered 2017-07-11: 8 mg via ORAL
  Filled 2017-07-10 (×2): qty 2

## 2017-07-10 MED ORDER — PANTOPRAZOLE SODIUM 40 MG PO TBEC
40.0000 mg | DELAYED_RELEASE_TABLET | Freq: Every day | ORAL | Status: DC
  Administered 2017-07-11: 40 mg via ORAL
  Filled 2017-07-10: qty 1

## 2017-07-10 MED ORDER — ENOXAPARIN SODIUM 40 MG/0.4ML ~~LOC~~ SOLN
40.0000 mg | SUBCUTANEOUS | Status: DC
  Administered 2017-07-10: 40 mg via SUBCUTANEOUS
  Filled 2017-07-10: qty 0.4

## 2017-07-10 MED ORDER — ONDANSETRON HCL 4 MG PO TABS: 4.0000 mg | ORAL_TABLET | Freq: Four times a day (QID) | ORAL | Status: DC | PRN

## 2017-07-10 MED ORDER — DULOXETINE HCL 30 MG PO CPEP
30.0000 mg | ORAL_CAPSULE | Freq: Every day | ORAL | Status: DC
  Administered 2017-07-10: 30 mg via ORAL
  Filled 2017-07-10: qty 1

## 2017-07-10 MED ORDER — PRAVASTATIN SODIUM 20 MG PO TABS
20.0000 mg | ORAL_TABLET | Freq: Every day | ORAL | Status: DC
  Administered 2017-07-11: 20 mg via ORAL
  Filled 2017-07-10: qty 1

## 2017-07-10 MED ORDER — DULOXETINE HCL 30 MG PO CPEP
60.0000 mg | ORAL_CAPSULE | Freq: Every day | ORAL | Status: DC
  Administered 2017-07-11: 60 mg via ORAL
  Filled 2017-07-10: qty 2

## 2017-07-10 MED ORDER — DOCUSATE SODIUM 100 MG PO CAPS
100.0000 mg | ORAL_CAPSULE | Freq: Two times a day (BID) | ORAL | Status: DC
  Administered 2017-07-10: 100 mg via ORAL
  Filled 2017-07-10 (×2): qty 1

## 2017-07-10 MED ORDER — DULOXETINE HCL 30 MG PO CPEP: 30.0000 mg | ORAL_CAPSULE | Freq: Two times a day (BID) | ORAL | Status: DC

## 2017-07-10 MED ORDER — BISACODYL 5 MG PO TBEC: 5.0000 mg | DELAYED_RELEASE_TABLET | Freq: Every day | ORAL | Status: DC | PRN

## 2017-07-10 MED ORDER — TRAZODONE HCL 50 MG PO TABS: 25.0000 mg | ORAL_TABLET | Freq: Every evening | ORAL | Status: DC | PRN

## 2017-07-10 MED ORDER — ACETAMINOPHEN 650 MG RE SUPP: 650.0000 mg | Freq: Four times a day (QID) | RECTAL | Status: DC | PRN

## 2017-07-10 MED ORDER — NITROGLYCERIN 2 % TD OINT
0.5000 [in_us] | TOPICAL_OINTMENT | Freq: Four times a day (QID) | TRANSDERMAL | Status: DC
  Administered 2017-07-10: 0.5 [in_us] via TOPICAL
  Filled 2017-07-10 (×2): qty 1

## 2017-07-10 MED ORDER — ZOLPIDEM TARTRATE 5 MG PO TABS
5.0000 mg | ORAL_TABLET | Freq: Every evening | ORAL | Status: DC | PRN
  Administered 2017-07-10: 5 mg via ORAL
  Filled 2017-07-10: qty 1

## 2017-07-10 MED ORDER — GI COCKTAIL ~~LOC~~
30.0000 mL | Freq: Once | ORAL | Status: AC
  Administered 2017-07-10: 30 mL via ORAL
  Filled 2017-07-10: qty 30

## 2017-07-10 NOTE — H&P (Signed)
[place] [facility] [place] NAME: Jill Rivas    MR#:  [id number]  DATE OF BIRTH:  December 01, 1970  DATE OF ADMISSION:  07/10/2017  PRIMARY CARE PHYSICIAN: [facility] [place]   REQUESTING/REFERRING PHYSICIAN: Dr. Corky Downs  CHIEF COMPLAINT:chest pain   Chief Complaint  Patient presents with  . Chest Pain    HISTORY OF PRESENT ILLNESS:  Jill Rivas  is a 46 y.o. female with a known history of visit anxiety, RSD, PTSD, depression came in because of severe left-sided chest pain, pain was going to the jaw associated with nausea, sweating, dizziness. Patient has significant family history of premature coronary artery disease before age 45 on both the maternal and paternal side.So she is going to be admitted to observation status to telemetry for chest pain. Initial workup showed normal EKG without any ST-T changes, negative troponins.and appears to have severe anxiety and also PTSD.she told me that her psychiatric change her her from Valium to prazosin recently a month ago, according to her and sister is not helping her with anxiety. It appears that her chest pain is related to panic attack rather than acute chest pain  of cardiac origin  PAST MEDICAL HISTORY:   Past Medical History:  Diagnosis Date  . Anxiety   . CRPS (complex regional pain syndrome)   . Depression   . Kidney stones   . Migraines   . PTSD (post-traumatic stress disorder)   . Reflex sympathetic dystrophy    left foot  . Sleep apnea    tested again approx 1 month ago and sleep apnea has resolved.    PAST SURGICAL HISTOIRY:   Past Surgical History:  Procedure Laterality Date  . AUGMENTATION MAMMAPLASTY Bilateral 2005   SALINE  . BREAST ENHANCEMENT SURGERY Bilateral   . BREAST SURGERY    . CHOLECYSTECTOMY    . FOOT SURGERY Left   . KIDNEY SURGERY      SOCIAL HISTORY:   Social History  Substance Use Topics  . Smoking status: Never  Smoker  . Smokeless tobacco: Never Used  . Alcohol use No    FAMILY HISTORY:   Family History  Problem Relation Age of Onset  . Cancer Mother   . Breast cancer Mother     DRUG ALLERGIES:   Allergies  Allergen Reactions  . No Known Allergies     REVIEW OF SYSTEMS:  CONSTITUTIONAL: No fever, fatigue or weakness.  EYES: No blurred or double vision.  EARS, NOSE, AND THROAT: No tinnitus or ear pain.  RESPIRATORY: No cough, shortness of breath, wheezing or hemoptysis.  CARDIOVASCULAR: leftside chest pain, nausea, vomiting, dizziness, sweating GASTROINTESTINAL: No nausea, vomiting, diarrhea or abdominal pain.  GENITOURINARY: No dysuria, hematuria.  ENDOCRINE: No polyuria, nocturia,  HEMATOLOGY: No anemia, easy bruising or bleeding SKIN: No rash or lesion. MUSCULOSKELETAL: No joint pain or arthritis.   NEUROLOGIC: severe anxiety PSYCHIATRY: severe anxiety, constant trembling of both hands  MEDICATIONS AT HOME:   Prior to Admission medications   Medication Sig Start Date End Date Taking? Authorizing Provider  cetirizine (ZYRTEC) 10 MG tablet Take 10 mg by mouth daily. 02/08/17  Yes [provider]  diazepam (VALIUM) 5 MG tablet Take 5 mg by mouth 4 (four) times daily.   Yes [provider]  diclofenac sodium (VOLTAREN) 1 % GEL Apply 2-4 g to painful area of skin 4 times per day if tolerated. Patient with history of strain, sprain, and muscle spasms of extremities  05/15/16  Yes Mohammed Kindle, MD  DULoxetine (CYMBALTA) 30 MG capsule Take 30-60 mg by mouth 2 (two) times daily. Take 60 mg by mouth in the morning and 30 mg by mouth at bedtime. 06/23/17  Yes [provider]  ibuprofen (ADVIL,MOTRIN) 200 MG tablet Take 200 mg by mouth every 6 (six) hours as needed.   Yes [provider]  Levonorgestrel-Ethinyl Estradiol (AMETHIA,CAMRESE) 0.15-0.03 &0.01 MG tablet Take 1 tablet by mouth at bedtime. 03/03/17  Yes Harlin Heys, MD  loratadine  (CLARITIN) 10 MG tablet Take 10 mg by mouth daily.   Yes [provider]  omeprazole (PRILOSEC) 40 MG capsule TAKE 1 TABLET BY MOUTH DAILY FOR REFLUX 02/08/17  Yes [provider]  Oxycodone HCl 10 MG TABS Limit 4-6 tablets po daily if tolerated 05/15/16  Yes Mohammed Kindle, MD  pravastatin (PRAVACHOL) 20 MG tablet TAKE 1 TABLET BY MOUTH EVERY DAY FOR HIGH CHOLESTEROL 01/26/17  Yes [provider]  prazosin (MINIPRESS) 1 MG capsule Take 2 mg by mouth 3 (three) times daily.    Yes [provider]  propranolol ER (INDERAL LA) 60 MG 24 hr capsule Take 60 mg by mouth daily.   Yes [provider]  QUEtiapine (SEROQUEL) 400 MG tablet Take 400 mg by mouth at bedtime. 01/22/17  Yes [provider]  tiZANidine (ZANAFLEX) 4 MG tablet Take 6-8 mg by mouth 3 (three) times daily as needed. 06/24/17  Yes [provider]  topiramate (TOPAMAX) 50 MG tablet Limit 1-2 tabs by mouth twice a day if tolerated 04/15/16  Yes Mohammed Kindle, MD  triamcinolone (KENALOG) 0.025 % cream APPLY TO AFFECTED AREA TWICE DAILY FOR UP TO TWO WEEKS 11/28/16  Yes [provider]  zolpidem (AMBIEN) 10 MG tablet Take 10 mg by mouth at bedtime as needed for sleep. 06/24/17  Yes [provider]  lidocaine (LIDODERM) 5 % Apply 1 - 2  patches to painful area of skin for 12 hours then remove for 12 hours and  Repeat process if tolerated Patient not taking: Reported on 07/10/2017 05/15/16   Mohammed Kindle, MD  lidocaine (LIDODERM) 5 % Apply 1 - 2  patches to painful area of skin for 12 hours then remove for 12 hours and  Repeat process if tolerated  Patient with history of shingles Patient not taking: Reported on 07/10/2017 05/15/16   Mohammed Kindle, MD  norethindrone (AYGESTIN) 5 MG tablet Take 1 tablet (5 mg total) by mouth 2 (two) times daily. Then decrease to once daily as directed. Patient not taking: Reported on 07/10/2017 04/28/17 05/18/17  Harlin Heys, MD       VITAL SIGNS:  Blood pressure (!) 160/89, pulse 78, temperature 98.3 F (36.8 C), temperature source Oral, resp. rate 10, height 5\' 7"  (1.702 m), weight 84.8 kg (187 lb), last menstrual period 05/11/2017, SpO2 100 %.  PHYSICAL EXAMINATION:  GENERAL:  46 y.o.-year-old patient lying in the bed with no acute distress.  EYES: Pupils equal, round, reactive to light. No scleral icterus. Extraocular muscles intact.  HEENT: Head atraumatic, normocephalic. Oropharynx and nasopharynx clear.  NECK:  Supple, no jugular venous distention. No thyroid enlargement, no tenderness.  LUNGS: Normal breath sounds bilaterally, no wheezing, rales,rhonchi or crepitation. No use of accessory muscles of respiration.  CARDIOVASCULAR: S1, S2 slight tachycardia.. No murmurs, rubs, or gallops.  ABDOMEN: Soft, nontender, nondistended. Bowel sounds present. No organomegaly or mass.  EXTREMITIES: No pedal edema, cyanosis, or clubbing.  NEUROLOGIC: Cranial nerves II through  XII are intact. Muscle strength 5/5 in all extremities. Sensation intact. Gait not checked.  PSYCHIATRIC: The patient is alert and oriented x 3.  SKIN: No obvious rash, lesion, or ulcer.   LABORATORY PANEL:   CBC  Recent Labs Lab 07/10/17 1705  WBC 9.1  HGB 13.5  HCT 40.3  PLT 270   ------------------------------------------------------------------------------------------------------------------  Chemistries   Recent Labs Lab 07/10/17 1705  NA 140  K 3.1*  CL 109  CO2 21*  GLUCOSE 111*  BUN 8  CREATININE 1.14*  CALCIUM 8.2*  AST 16  ALT 8*  ALKPHOS 42  BILITOT 0.6   ------------------------------------------------------------------------------------------------------------------  Cardiac Enzymes  Recent Labs Lab 07/10/17 1705  TROPONINI <0.03   ------------------------------------------------------------------------------------------------------------------  RADIOLOGY:  Dg Chest 1 View  Result Date:  07/10/2017 CLINICAL DATA:  Chest pain EXAM: CHEST 1 VIEW COMPARISON:  None. FINDINGS: The heart size and mediastinal contours are within normal limits. Both lungs are clear. The visualized skeletal structures are unremarkable. IMPRESSION: No active disease. Electronically Signed   By: Franchot Gallo M.D.   On: 07/10/2017 16:49    EKG:   Orders placed or performed during the hospital encounter of 07/10/17  . ED EKG  . ED EKG    IMPRESSION AND PLAN:   46 year old female patient with left-sided chest pain with nausea vomiting diaphoresis since yesterday, chest pain partially relieved with nitroglycerin given in the emergency room has the strong family history of premature coronary artery disease so yet physician wanted to admit for chest pain evaluation with observation status,check cardiac enzymes and troponins 2 more times, continue aspirin, beta blockers, statins, nitrates,follow echocardiogram results. If troponinsare elevated t thenequest cardiology consult. #2, severe anxiety, PTSD, RST. Patient is on chronic narcotics, follows up with pain management. Patient also psychiatric. And she still has severe anxiety, sweating and they are requesting To see a psychiatrist in the hospital.    All the records are reviewed and case discussed with ED provider. Management plans discussed with the patient, family and they are in agreement.  CODE STATUS: full  TOTAL TIME TAKING CARE OF THIS PATIENT: 55 minutes.    Epifanio Lesches M.D on 07/10/2017 at 8:32 PM  Between 7am to 6pm - Pager - [phone]  After 6pm go to [email or web address] - password [email or web address] [facility]  Office  [phone]  CC: Primary care physician; [facility] [place]  Note: This dictation was prepared with Diplomatic Services operational officer dictation along with smaller phrase technology. Any transcriptional errors that result from this process are unintentional.

## 2017-07-10 NOTE — ED Provider Notes (Signed)
[facility] Emergency Department Provider Note   ____________________________________________    I have reviewed the triage vital signs and the nursing notes.   HISTORY  Chief Complaint Chest Pain     HPI Jill Rivas is a 46 y.o. female who presents with complaints of chest pain.  Patient reports chest pain started yesterday morning while she was driving.  She describes it as a pressure-like pain.  She then developed nausea as well.  She reports this is been relatively constant over the last 24 hours.  No fevers or chills.  No cough.  No shortness of breath.  No recent travel.  No calf pain or swelling.  She has not taken anything for this.  She reports extensive history of cardiac disease in her family   Past Medical History:  Diagnosis Date  . Anxiety   . CRPS (complex regional pain syndrome)   . Depression   . Kidney stones   . Migraines   . PTSD (post-traumatic stress disorder)   . Reflex sympathetic dystrophy    left foot  . Sleep apnea    tested again approx 1 month ago and sleep apnea has resolved.    Patient Active Problem List   Diagnosis Date Noted  . Bilateral occipital neuralgia 01/10/2016  . Cervical disc disorder with radiculopathy of cervical region 02/27/2015  . 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 DDD (degenerative disc disease), cervical with radiculopathy 01/23/2015  . CRPS 1 (complex regional pain syndrome I) of lower limb 01/23/2015  . DDD (degenerative disc disease), lumbosacral 01/23/2015    Past Surgical History:  Procedure Laterality Date  . AUGMENTATION MAMMAPLASTY Bilateral 2005   SALINE  . BREAST ENHANCEMENT SURGERY Bilateral   . BREAST SURGERY    . CHOLECYSTECTOMY    . FOOT SURGERY Left   . KIDNEY SURGERY      Prior to Admission medications   Medication Sig Start Date End Date Taking? Authorizing Provider  cefUROXime (CEFTIN) 250 MG tablet Take 1 tablet (250 mg total) by mouth 2 (two) times  daily with a meal. 08/06/15   Mohammed Kindle, MD  cefUROXime (CEFTIN) 250 MG tablet Take 1 tablet (250 mg total) by mouth 2 (two) times daily with a meal. 09/10/15   Mohammed Kindle, MD  cefUROXime (CEFTIN) 250 MG tablet Take 1 tablet (250 mg total) by mouth 2 (two) times daily with a meal. 11/07/15   Mohammed Kindle, MD  cefUROXime (CEFTIN) 250 MG tablet Take 1 tablet (250 mg total) by mouth 2 (two) times daily with a meal. 11/14/15   Mohammed Kindle, MD  cefUROXime (CEFTIN) 250 MG tablet Take 1 tablet (250 mg total) by mouth 2 (two) times daily with a meal. 11/14/15   Mohammed Kindle, MD  cefUROXime (CEFTIN) 250 MG tablet Take 1 tablet (250 mg total) by mouth 2 (two) times daily with a meal. 12/12/15   Mohammed Kindle, MD  cefUROXime (CEFTIN) 250 MG tablet Take 1 tablet (250 mg total) by mouth 2 (two) times daily with a meal. 01/21/16   Mohammed Kindle, MD  cefUROXime (CEFTIN) 250 MG tablet Take 1 tablet (250 mg total) by mouth 2 (two) times daily with a meal. 04/02/16   Mohammed Kindle, MD  cefUROXime (CEFTIN) 250 MG tablet Take 1 tablet (250 mg total) by mouth 2 (two) times daily with a meal. 05/07/16   Mohammed Kindle, MD  cefUROXime (CEFTIN) 500 MG tablet Limit 1 tab by mouth twice per day if tolerated  (  NO CIPRO ) 12/06/15   Mohammed Kindle, MD  cetirizine (ZYRTEC) 10 MG tablet Take 10 mg by mouth daily. 02/08/17   [provider]  ciprofloxacin (CIPRO) 500 MG tablet Limit 1 tablet by mouth twice per day if tolerated 12/04/15   Mohammed Kindle, MD  Cyanocobalamin (VITAMIN B-12 IJ) Inject as directed once a week. Reported on 01/21/2016    [provider]  diazepam (VALIUM) 5 MG tablet Take 5 mg by mouth 4 (four) times daily.    [provider]  diclofenac sodium (VOLTAREN) 1 % GEL Apply 2-4 g to painful area of skin 4 times per day if tolerated. Patient with history of strain, sprain, and muscle spasms of extremities 05/15/16   Mohammed Kindle, MD  DULoxetine (CYMBALTA) 60 MG capsule Take 30  mg by mouth daily.     [provider]  ibuprofen (ADVIL,MOTRIN) 200 MG tablet Take 200 mg by mouth every 6 (six) hours as needed.    [provider]  Levonorgestrel-Ethinyl Estradiol (AMETHIA,CAMRESE) 0.15-0.03 &0.01 MG tablet Take 1 tablet by mouth at bedtime. 03/03/17   Harlin Heys, MD  lidocaine (LIDODERM) 5 % Apply 1 - 2  patches to painful area of skin for 12 hours then remove for 12 hours and  Repeat process if tolerated 05/15/16   Mohammed Kindle, MD  lidocaine (LIDODERM) 5 % Apply 1 - 2  patches to painful area of skin for 12 hours then remove for 12 hours and  Repeat process if tolerated  Patient with history of shingles 05/15/16   Mohammed Kindle, MD  loratadine (CLARITIN) 10 MG tablet Take 10 mg by mouth daily.    [provider]  norethindrone (AYGESTIN) 5 MG tablet Take 1 tablet (5 mg total) by mouth 2 (two) times daily. Then decrease to once daily as directed. 04/28/17 05/18/17  Harlin Heys, MD  omeprazole (PRILOSEC) 40 MG capsule TAKE 1 TABLET BY MOUTH DAILY FOR REFLUX 02/08/17   [provider]  Oxycodone HCl 10 MG TABS Limit 4-6 tablets po daily if tolerated 05/15/16   Mohammed Kindle, MD  pantoprazole (PROTONIX) 40 MG tablet Take 40 mg by mouth daily.    [provider]  pravastatin (PRAVACHOL) 20 MG tablet TAKE 1 TABLET BY MOUTH EVERY DAY FOR HIGH CHOLESTEROL 01/26/17   [provider]  prazosin (MINIPRESS) 1 MG capsule Take 2 mg by mouth at bedtime.     [provider]  propranolol ER (INDERAL LA) 60 MG 24 hr capsule Take 60 mg by mouth daily.    [provider]  QUEtiapine (SEROQUEL) 300 MG tablet Take 300 mg by mouth at bedtime.    [provider]  QUEtiapine (SEROQUEL) 400 MG tablet Take 400 mg by mouth at bedtime. 01/22/17   [provider]  sertraline (ZOLOFT) 100 MG tablet Take 100 mg by mouth 2 (two) times daily. Reported on 04/02/2016    [provider]  tiZANidine (ZANAFLEX)  4 MG capsule Take 4 mg by mouth 3 (three) times daily.    [provider]  topiramate (TOPAMAX) 50 MG tablet Limit 1-2 tabs by mouth twice a day if tolerated 04/15/16   Mohammed Kindle, MD  triamcinolone (KENALOG) 0.025 % cream APPLY TO AFFECTED AREA TWICE DAILY FOR UP TO TWO WEEKS 11/28/16   [provider]  zolpidem (AMBIEN) 5 MG tablet Take 5 mg by mouth at bedtime as needed for sleep.    [provider]     Allergies No  known allergies  Family History  Problem Relation Age of Onset  . Cancer Mother   . Breast cancer Mother     Social History Social History  Substance Use Topics  . Smoking status: Never Smoker  . Smokeless tobacco: Never Used  . Alcohol use No    Review of Systems  Constitutional: No fever/chills Eyes: No visual changes.  ENT: No sore throat. Cardiovascular: As above Respiratory: Denies shortness of breath. Gastrointestinal: No abdominal pain.  No nausea, no vomiting.   Genitourinary: Negative for dysuria. Musculoskeletal: Negative for back pain. Skin: Negative for rash. Neurological: Negative for headaches  ____________________________________________   PHYSICAL EXAM:  VITAL SIGNS: ED Triage Vitals  Enc Vitals Group     BP 07/10/17 1638 (!) 148/93     Pulse Rate 07/10/17 1638 78     Resp 07/10/17 1638 14     Temp 07/10/17 1638 98.3 F (36.8 C)     Temp Source 07/10/17 1638 Oral     SpO2 07/10/17 1638 100 %     Weight 07/10/17 1639 84.8 kg (187 lb)     Height 07/10/17 1639 1.702 m (5\' 7" )     Head Circumference --      Peak Flow --      Pain Score 07/10/17 1638 2     Pain Loc --      Pain Edu? --      Excl. in [place]? --    Constitutional: Alert and oriented. No acute distress.  Patient is anxious Eyes: Conjunctivae are normal.   Nose: No congestion/rhinnorhea. Mouth/Throat: Mucous membranes are moist.    Cardiovascular: Normal rate, regular rhythm. Grossly normal heart sounds.  Good peripheral  circulation. Respiratory: Normal respiratory effort.  No retractions. Lungs CTAB. Gastrointestinal: Soft and nontender. No distention.  No CVA tenderness. Genitourinary: deferred Musculoskeletal: No lower extremity tenderness nor edema.  Warm and well perfused Neurologic:  Normal speech and language. No gross focal neurologic deficits are appreciated.  Skin:  Skin is warm, dry and intact. No rash noted.   ____________________________________________   LABS (all labs ordered are listed, but only abnormal results are displayed)  Labs Reviewed  COMPREHENSIVE METABOLIC PANEL - Abnormal; Notable for the following:       Result Value   Potassium 3.1 (*)    CO2 21 (*)    Glucose, Bld 111 (*)    Creatinine, Ser 1.14 (*)    Calcium 8.2 (*)    ALT 8 (*)    GFR calc non Af Amer 56 (*)    All other components within normal limits  CBC  TROPONIN I   ____________________________________________  EKG  ED ECG REPORT I, Lavonia Drafts, the attending physician, personally viewed and interpreted this ECG.  Date: 07/10/2017 EKG Time: 1640 Rate: 74 Rhythm: normal sinus rhythm QRS Axis: normal Intervals: normal ST/T Wave abnormalities: normal   ____________________________________________  RADIOLOGY  Chest x-ray unremarkable ____________________________________________   PROCEDURES  Procedure(s) performed: No    Critical Care performed: No ____________________________________________   INITIAL IMPRESSION / ASSESSMENT AND PLAN / ED COURSE  Pertinent labs & imaging results that were available during my care of the patient were reviewed by me and considered in my medical decision making (see chart for details).  Patient well-appearing though anxious.  EKG is reassuring.  We will check cardiac enzymes, obtain chest x-ray, patient received nitro spray from EMS and reports this helped her feel better.  Medical records reviewed, noncontributory at this time  Differential  diagnosis  includes ACS, chest wall pain, myocarditis, pericarditis, PE  ----------------------------------------- 6:53 PM on 07/10/2017 -----------------------------------------  Lab work is unremarkable.  Patient reports feeling significant better but still burning pain in the center of the chest.  Will try GI cocktail as I suspect gastritis/GERD  ----------------------------------------- 7:58 PM on 07/10/2017 -----------------------------------------  Patient had minimal improvement from GI cocktail.  Given her extensive family history of cardiac disease, high cholesterol and continued chest discomfort will admit for further management    ____________________________________________   FINAL CLINICAL IMPRESSION(S) / ED DIAGNOSES  Final diagnoses:  Chest pain, unspecified type      NEW MEDICATIONS STARTED DURING THIS VISIT:  New Prescriptions   No medications on file     Note:  This document was prepared using Dragon voice recognition software and may include unintentional dictation errors.    Lavonia Drafts, MD 07/10/17 2012

## 2017-07-10 NOTE — ED Triage Notes (Signed)
Pt comes into the ED via [facility] from home c/o chest pain that started yesterday.  Patient having continual Nausea, dizziness, and shortness of breath.  H/o anxiety.  Patient given 324 asp, 2 nitro, and 4 zofran in route with EMS.  Patient in NAD at this time but presents trembling.

## 2017-07-10 NOTE — ED Notes (Signed)
Pt transport to 257 

## 2017-07-11 ENCOUNTER — Observation Stay
Admit: 2017-07-11 | Discharge: 2017-07-11 | Disposition: A | Discharge status: Home / Self Care | Payer: Medicare Other | Attending: Internal Medicine | Admitting: Internal Medicine

## 2017-07-11 DIAGNOSIS — F331 Major depressive disorder, recurrent, moderate: Secondary | ICD-10-CM | POA: Diagnosis not present

## 2017-07-11 LAB — BASIC METABOLIC PANEL
Anion gap: 7 (ref 5–15)
BUN: 9 mg/dL (ref 6–20)
CHLORIDE: 108 mmol/L (ref 101–111)
CO2: 23 mmol/L (ref 22–32)
CREATININE: 1.17 mg/dL — AB (ref 0.44–1.00)
Calcium: 8.4 mg/dL — ABNORMAL LOW (ref 8.9–10.3)
GFR calc non Af Amer: 54 mL/min — ABNORMAL LOW (ref >60)
Glucose, Bld: 114 mg/dL — ABNORMAL HIGH (ref 65–99)
POTASSIUM: 3.1 mmol/L — AB (ref 3.5–5.1)
Sodium: 138 mmol/L (ref 135–145)

## 2017-07-11 LAB — CBC
HEMATOCRIT: 39.6 % (ref 35.0–47.0)
HEMOGLOBIN: 13.3 g/dL (ref 12.0–16.0)
MCH: 29.3 pg (ref 26.0–34.0)
MCHC: 33.7 g/dL (ref 32.0–36.0)
MCV: 87 fL (ref 80.0–100.0)
Platelets: 258 10*3/uL (ref 150–440)
RBC: 4.55 MIL/uL (ref 3.80–5.20)
RDW: 13.4 % (ref 11.5–14.5)
WBC: 7.6 10*3/uL (ref 3.6–11.0)

## 2017-07-11 LAB — TROPONIN I: Troponin I: <0.03 ng/mL (ref <0.03)

## 2017-07-11 LAB — GLUCOSE, CAPILLARY
GLUCOSE-CAPILLARY: 97 mg/dL (ref 65–99)
GLUCOSE-CAPILLARY: 114 mg/dL — AB (ref 65–99)

## 2017-07-11 MED ORDER — DIAZEPAM 5 MG PO TABS
5.0000 mg | ORAL_TABLET | Freq: Once | ORAL | Status: AC
  Administered 2017-07-11: 5 mg via ORAL

## 2017-07-11 MED ORDER — BUSPIRONE HCL 5 MG PO TABS: 5.0000 mg | ORAL_TABLET | Freq: Three times a day (TID) | ORAL | 0 refills | Status: DC

## 2017-07-11 MED ORDER — BUSPIRONE HCL 5 MG PO TABS
5.0000 mg | ORAL_TABLET | Freq: Three times a day (TID) | ORAL | Status: DC
  Administered 2017-07-11: 5 mg via ORAL
  Filled 2017-07-11 (×2): qty 1

## 2017-07-11 MED ORDER — DIAZEPAM 5 MG PO TABS
ORAL_TABLET | ORAL | Status: AC
  Administered 2017-07-11: 5 mg via ORAL
  Filled 2017-07-11: qty 1
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
## 2017-07-11 NOTE — Discharge Summary (Addendum)
[facility] [place] NAME: Jill Rivas    MR#:  [id number]  DATE OF BIRTH:  Mar 04, 1971  DATE OF ADMISSION:  07/10/2017 ADMITTING PHYSICIAN: Epifanio Lesches, MD  DATE OF DISCHARGE: 07/11/17  PRIMARY CARE PHYSICIAN: [facility] [place]    ADMISSION DIAGNOSIS:  Chest pain, unspecified type [R07.9]  DISCHARGE DIAGNOSIS:  Chronic anxiety and Depression Chronic Pain syndrome   SECONDARY DIAGNOSIS:   Past Medical History:  Diagnosis Date  . Anxiety   . CRPS (complex regional pain syndrome)   . Depression   . Kidney stones   . Migraines   . PTSD (post-traumatic stress disorder)   . Reflex sympathetic dystrophy    left foot  . Sleep apnea    tested again approx 1 month ago and sleep apnea has resolved.    HOSPITAL COURSE:  Rusty Glodowski  is a 46 y.o. female with a known history of visit anxiety, RSD, PTSD, depression came in because of severe left-sided chest pain, pain was going to the jaw associated with nausea, sweating, dizziness  1. Chest pain Danielle Dess with shaking/tremors/palpitations increased sweating consistent with anxiety more than acute ACS -Normal sinus rhythm on telemetry and EKG -3 sets of cardiac enzymes negative -Spoke with patient at length--patient reports she has been feeling lonely and her chronic pain is causing more depression lately. It seems she does not have much social support other than her second cousin. She sees nurse practitioner. -Patient was seen by a psychiatrist recommend start BuSpar 5 mg 3 times a day -Patient denies any suicidal ideation -Give her 1 dose of Valium 5 mg..  2. Hyperlipidemia Continue statins  3. Chronic pain -Patient sees Dr. Primus Bravo at [facility] pain clinic -Continue pain meds -I will not be prescribing any pain medication   Discharge home today CONSULTS OBTAINED:  Treatment Team:  Jolene Schimke, MD  DRUG ALLERGIES:    Allergies  Allergen Reactions  . No Known Allergies     DISCHARGE MEDICATIONS:   Current Discharge Medication List    START taking these medications   Details  busPIRone (BUSPAR) 5 MG tablet Take 1 tablet (5 mg total) by mouth 3 (three) times daily. Qty: 30 tablet, Refills: 0      CONTINUE these medications which have NOT CHANGED   Details  cetirizine (ZYRTEC) 10 MG tablet Take 10 mg by mouth daily. Refills: 5    diclofenac sodium (VOLTAREN) 1 % GEL Apply 2-4 g to painful area of skin 4 times per day if tolerated. Patient with history of strain, sprain, and muscle spasms of extremities Qty: 500 g, Refills: 0    DULoxetine (CYMBALTA) 30 MG capsule Take 30-60 mg by mouth 2 (two) times daily. Take 60 mg by mouth in the morning and 30 mg by mouth at bedtime. Refills: 0    ibuprofen (ADVIL,MOTRIN) 200 MG tablet Take 200 mg by mouth every 6 (six) hours as needed.    Levonorgestrel-Ethinyl Estradiol (AMETHIA,CAMRESE) 0.15-0.03 &0.01 MG tablet Take 1 tablet by mouth at bedtime. Qty: 84 tablet, Refills: 1   Associated Diagnoses: Dysfunctional uterine bleeding; Dysmenorrhea; Initiation of OCP (BCP)    loratadine (CLARITIN) 10 MG tablet Take 10 mg by mouth daily.    omeprazole (PRILOSEC) 40 MG capsule TAKE 1 TABLET BY MOUTH DAILY FOR REFLUX Refills: 5    Oxycodone HCl 10 MG TABS Limit 4-6 tablets po daily if tolerated Qty: 180 tablet, Refills: 0    pravastatin (PRAVACHOL) 20  MG tablet TAKE 1 TABLET BY MOUTH EVERY DAY FOR HIGH CHOLESTEROL Refills: 2    prazosin (MINIPRESS) 1 MG capsule Take 2 mg by mouth 3 (three) times daily.     propranolol ER (INDERAL LA) 60 MG 24 hr capsule Take 60 mg by mouth daily.    QUEtiapine (SEROQUEL) 400 MG tablet Take 400 mg by mouth at bedtime. Refills: 2    tiZANidine (ZANAFLEX) 4 MG tablet Take 6-8 mg by mouth 3 (three) times daily as needed. Refills: 2    topiramate (TOPAMAX) 50 MG tablet Limit 1-2 tabs by mouth twice a day if  tolerated Qty: 120 tablet, Refills: 2    triamcinolone (KENALOG) 0.025 % cream APPLY TO AFFECTED AREA TWICE DAILY FOR UP TO TWO WEEKS Refills: 1    norethindrone (AYGESTIN) 5 MG tablet Take 1 tablet (5 mg total) by mouth 2 (two) times daily. Then decrease to once daily as directed. Qty: 42 tablet, Refills: 0   Associated Diagnoses: Dysfunctional uterine bleeding; Dysmenorrhea      STOP taking these medications     diazepam (VALIUM) 5 MG tablet      zolpidem (AMBIEN) 10 MG tablet      lidocaine (LIDODERM) 5 %      lidocaine (LIDODERM) 5 %         If you experience worsening of your admission symptoms, develop shortness of breath, life threatening emergency, suicidal or homicidal thoughts you must seek medical attention immediately by calling 911 or calling your MD immediately  if symptoms less severe.  You Must read complete instructions/literature along with all the possible adverse reactions/side effects for all the Medicines you take and that have been prescribed to you. Take any new Medicines after you have completely understood and accept all the possible adverse reactions/side effects.   Please note  You were cared for by a hospitalist during your hospital stay. If you have any questions about your discharge medications or the care you received while you were in the hospital after you are discharged, you can call the unit and asked to speak with the hospitalist on call if the hospitalist that took care of you is not available. Once you are discharged, your primary care physician will handle any further medical issues. Please note that NO REFILLS for any discharge medications will be authorized once you are discharged, as it is imperative that you return to your primary care physician (or establish a relationship with a primary care physician if you do not have one) for your aftercare needs so that they can reassess your need for medications and monitor your lab values. Today    SUBJECTIVE    Very tearful this am No family in the room Ashly RN with me VITAL SIGNS:  Blood pressure (!) 147/77, pulse 83, temperature 98.7 F (37.1 C), temperature source Oral, resp. rate 14, height 5\' 7"  (1.702 m), weight 88.9 kg (195 lb 14.4 oz), last menstrual period 05/11/2017, SpO2 98 %.  I/O:    Intake/Output Summary (Last 24 hours) at 07/11/17 1301 Last data filed at 07/11/17 1252  Gross per 24 hour  Intake              480 ml  Output              500 ml  Net              -20 ml    PHYSICAL EXAMINATION:  GENERAL:  46 y.o.-year-old patient lying in the bed with  no acute distress.  EYES: Pupils equal, round, reactive to light and accommodation. No scleral icterus. Extraocular muscles intact.  HEENT: Head atraumatic, normocephalic. Oropharynx and nasopharynx clear.  NECK:  Supple, no jugular venous distention. No thyroid enlargement, no tenderness.  LUNGS: Normal breath sounds bilaterally, no wheezing, rales,rhonchi or crepitation. No use of accessory muscles of respiration.  CARDIOVASCULAR: S1, S2 normal. No murmurs, rubs, or gallops.  ABDOMEN: Soft, non-tender, non-distended. Bowel sounds present. No organomegaly or mass.  EXTREMITIES: No pedal edema, cyanosis, or clubbing.  NEUROLOGIC: Cranial nerves II through XII are intact. Muscle strength 5/5 in all extremities. Sensation intact. Gait not checked.  PSYCHIATRIC: The patient is alert and oriented x 3. Anxious and shaky SKIN: No obvious rash, lesion, or ulcer.   DATA REVIEW:   CBC   Recent Labs Lab 07/11/17 0450  WBC 7.6  HGB 13.3  HCT 39.6  PLT 258    Chemistries   Recent Labs Lab 07/10/17 1705 07/11/17 0450  NA 140 138  K 3.1* 3.1*  CL 109 108  CO2 21* 23  GLUCOSE 111* 114*  BUN 8 9  CREATININE 1.14* 1.17*  CALCIUM 8.2* 8.4*  AST 16  --   ALT 8*  --   ALKPHOS 42  --   BILITOT 0.6  --     Microbiology Results   No results found for this or any previous visit (from the past 240  hour(s)).  RADIOLOGY:  Dg Chest 1 View  Result Date: 07/10/2017 CLINICAL DATA:  Chest pain EXAM: CHEST 1 VIEW COMPARISON:  None. FINDINGS: The heart size and mediastinal contours are within normal limits. Both lungs are clear. The visualized skeletal structures are unremarkable. IMPRESSION: No active disease. Electronically Signed   By: Franchot Gallo M.D.   On: 07/10/2017 16:49     Management plans discussed with the patient, family and they are in agreement.  CODE STATUS:     Code Status Orders        Start     Ordered   07/10/17 2028  Full code  Continuous     07/10/17 2031    Code Status History    Date Active Date Inactive Code Status Order ID Comments User Context   This patient has a current code status but no historical code status.      TOTAL TIME TAKING CARE OF THIS PATIENT: *40* minutes.    Hazael Olveda M.D on 07/11/2017 at 1:01 PM  Between 7am to 6pm - Pager - [phone] After 6pm go to [email or web address] - password [email or web address] [facility]  Office  [phone]  CC: Primary care physician; [facility] [place]

## 2017-07-11 NOTE — Progress Notes (Signed)
*  PRELIMINARY RESULTS* Echocardiogram 2D Echocardiogram has been performed.  Jill Rivas 07/11/2017, 3:45 PM

## 2017-07-11 NOTE — Progress Notes (Signed)
Pt. Has no complaints at this time, education given, prescriptions at pharmacy, VSS, pt. Will call Monday to follow up with Dr. Dorann Ou for psych. Home with sister via wheelchair.

## 2017-07-12 LAB — ECHOCARDIOGRAM COMPLETE
Height: 67 in
Weight: 3134.4 oz

## 2017-07-12 LAB — HIV ANTIBODY (ROUTINE TESTING W REFLEX): HIV SCREEN 4TH GENERATION: NONREACTIVE

## 2017-09-22 DIAGNOSIS — G43909 Migraine, unspecified, not intractable, without status migrainosus: Secondary | ICD-10-CM

## 2017-09-22 DIAGNOSIS — M5135 Other intervertebral disc degeneration, thoracolumbar region: Secondary | ICD-10-CM

## 2017-09-22 DIAGNOSIS — I10 Essential (primary) hypertension: Secondary | ICD-10-CM

## 2017-09-22 DIAGNOSIS — G9050 Complex regional pain syndrome I, unspecified: Secondary | ICD-10-CM

## 2017-09-22 HISTORY — DX: Complex regional pain syndrome I, unspecified: G90.50

## 2017-09-22 HISTORY — DX: Essential (primary) hypertension: I10

## 2017-09-22 HISTORY — DX: Migraine, unspecified, not intractable, without status migrainosus: G43.909

## 2017-09-22 HISTORY — DX: Other intervertebral disc degeneration, thoracolumbar region: M51.35

## 2017-09-29 ENCOUNTER — Encounter: Payer: Self-pay | Admitting: Obstetrics and Gynecology

## 2017-09-29 ENCOUNTER — Ambulatory Visit (INDEPENDENT_AMBULATORY_CARE_PROVIDER_SITE_OTHER): Payer: Medicare Other | Admitting: Obstetrics and Gynecology

## 2017-09-29 VITALS — BP 135/84 | HR 105 | Ht 67.0 in | Wt 193.1 lb

## 2017-09-29 DIAGNOSIS — D251 Intramural leiomyoma of uterus: Secondary | ICD-10-CM

## 2017-09-29 DIAGNOSIS — N946 Dysmenorrhea, unspecified: Secondary | ICD-10-CM | POA: Diagnosis not present

## 2017-09-29 DIAGNOSIS — N938 Other specified abnormal uterine and vaginal bleeding: Secondary | ICD-10-CM | POA: Diagnosis not present

## 2017-09-29 NOTE — Progress Notes (Signed)
HPI:      [name] is a 47 y.o. G0P0000 who LMP was Patient's last menstrual period was 09/03/2017 (approximate).  Subjective:   She presents today for follow-up of dysfunctional bleeding and dysmenorrhea caused by uterine fibroids.  She has been using Seasonique birth control to help to alleviate her symptoms.  She reports that her bleeding has been alleviated but she continues to experience significant cramping each month.  The bottom line is she does not feel the pills are giving her enough relief from her cramping and bloating to justify the continued use.  She has also declined other options regarding treatment.  She is requesting definitive management (hysterectomy).    Hx: The following portions of the patient's history were reviewed and updated as appropriate:             She  has a past medical history of Anxiety, CRPS (complex regional pain syndrome), Depression, Kidney stones, Migraines, PTSD (post-traumatic stress disorder), Reflex sympathetic dystrophy, and Sleep apnea. She does not have any pertinent problems on file. She  has a past surgical history that includes Cholecystectomy; Foot surgery (Left); Kidney surgery; Breast surgery; Breast enhancement surgery (Bilateral); and Augmentation mammaplasty (Bilateral, 2005). Her family history includes Breast cancer in her mother; Cancer in her mother. She  reports that  has never smoked. she has never used smokeless tobacco. She reports that she does not drink alcohol or use drugs. She is allergic to no known allergies.       Review of Systems:  Review of Systems  Constitutional: Denied constitutional symptoms, night sweats, recent illness, fatigue, fever, insomnia and weight loss.  Eyes: Denied eye symptoms, eye pain, photophobia, vision change and visual disturbance.  Ears/Nose/Throat/Neck: Denied ear, nose, throat or neck symptoms, hearing loss, nasal discharge, sinus congestion and sore throat.  Cardiovascular:  Denied cardiovascular symptoms, arrhythmia, chest pain/pressure, edema, exercise intolerance, orthopnea and palpitations.  Respiratory: Denied pulmonary symptoms, asthma, pleuritic pain, productive sputum, cough, dyspnea and wheezing.  Gastrointestinal: Denied, gastro-esophageal reflux, melena, nausea and vomiting.  Genitourinary: See HPI for additional information.  Musculoskeletal: Denied musculoskeletal symptoms, stiffness, swelling, muscle weakness and myalgia.  Dermatologic: Denied dermatology symptoms, rash and scar.  Neurologic: Denied neurology symptoms, dizziness, headache, neck pain and syncope.  Psychiatric: Denied psychiatric symptoms, anxiety and depression.  Endocrine: Denied endocrine symptoms including hot flashes and night sweats.   Meds:   Current Outpatient Medications on File Prior to Visit  Medication Sig Dispense Refill  . diclofenac sodium (VOLTAREN) 1 % GEL Apply 2-4 g to painful area of skin 4 times per day if tolerated. Patient with history of strain, sprain, and muscle spasms of extremities 500 g 0  . DULoxetine (CYMBALTA) 30 MG capsule Take 30-60 mg by mouth 2 (two) times daily. Take 60 mg by mouth in the morning and 30 mg by mouth at bedtime.  0  . ibuprofen (ADVIL,MOTRIN) 200 MG tablet Take 200 mg by mouth every 6 (six) hours as needed.    . loratadine (CLARITIN) 10 MG tablet Take 10 mg by mouth daily.    Rivas Kitchen omeprazole (PRILOSEC) 40 MG capsule TAKE 1 TABLET BY MOUTH DAILY FOR REFLUX  5  . Oxycodone HCl 10 MG TABS Limit 4-6 tablets po daily if tolerated 180 tablet 0  . pravastatin (PRAVACHOL) 20 MG tablet TAKE 1 TABLET BY MOUTH EVERY DAY FOR HIGH CHOLESTEROL  2  . prazosin (MINIPRESS) 1 MG capsule Take 2 mg by mouth 3 (three) times daily.     Rivas Kitchen  propranolol ER (INDERAL LA) 60 MG 24 hr capsule Take 60 mg by mouth daily.    . QUEtiapine (SEROQUEL) 400 MG tablet Take 400 mg by mouth at bedtime.  2  . tiZANidine (ZANAFLEX) 4 MG tablet Take 6-8 mg by mouth 3 (three)  times daily as needed.  2  . topiramate (TOPAMAX) 50 MG tablet Limit 1-2 tabs by mouth twice a day if tolerated 120 tablet 2  . triamcinolone (KENALOG) 0.025 % cream APPLY TO AFFECTED AREA TWICE DAILY FOR UP TO TWO WEEKS FOR ECZEMA  2  . zolpidem (AMBIEN) 10 MG tablet TAKE 1 TABLET BY MOUTH AT BEDTIME AS NEEDED FOR INSOMNIA  5  . Levonorgestrel-Ethinyl Estradiol (AMETHIA,CAMRESE) 0.15-0.03 &0.01 MG tablet Take 1 tablet by mouth at bedtime. (Patient not taking: Reported on 09/29/2017) 84 tablet 1  . norethindrone (AYGESTIN) 5 MG tablet Take 1 tablet (5 mg total) by mouth 2 (two) times daily. Then decrease to once daily as directed. (Patient not taking: Reported on 07/10/2017) 42 tablet 0   Current Facility-Administered Medications on File Prior to Visit  Medication Dose Route Frequency Provider Last Rate Last Dose  . lactated ringers infusion 1,000 mL  1,000 mL Intravenous Continuous Mohammed Kindle, MD      . lactated ringers infusion 1,000 mL  1,000 mL Intravenous Continuous Mohammed Kindle, MD        Objective:     Vitals:   09/29/17 1437  BP: 135/84  Pulse: (!) 105                Assessment:    G0P0000 Patient Active Problem List   Diagnosis Date Noted  . Moderate episode of recurrent major depressive disorder ([place])   . Chest pain 07/10/2017  . Bilateral occipital neuralgia 01/10/2016  . Cervical disc disorder with radiculopathy of cervical region 02/27/2015  . 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 DDD (degenerative disc disease), cervical with radiculopathy 01/23/2015  . CRPS 1 (complex regional pain syndrome I) of lower limb 01/23/2015  . DDD (degenerative disc disease), lumbosacral 01/23/2015     1. Dysfunctional uterine bleeding   2. Fibroids, intramural   3. Dysmenorrhea     Patient has given an adequate trial to hormonal control of menstrual periods and dysmenorrhea and she has failed.  She would like to have a hysterectomy.   Plan:            1.  I have  discussed other methods of cycle control and patient is not interested.  She is specifically requesting hysterectomy.  2.  Patient has never had a vaginal birth so the possibility of LAVH or possible TAH was discussed in detail.  3.  Patient to return for her preop appointment. Orders No orders of the defined types were placed in this encounter.   No orders of the defined types were placed in this encounter.     F/U  Return in about 1 week (around 10/06/2017). I spent 18 minutes with this patient of which greater than 50% was spent discussing dysmenorrhea, cycle control using hormones, surgical options, decision for surgery.  All of her questions were answered.  Finis Bud, M.D. 09/29/2017 3:38 PM

## 2017-10-06 ENCOUNTER — Ambulatory Visit (INDEPENDENT_AMBULATORY_CARE_PROVIDER_SITE_OTHER): Payer: Medicare Other | Admitting: Obstetrics and Gynecology

## 2017-10-06 ENCOUNTER — Encounter: Payer: Self-pay | Admitting: Obstetrics and Gynecology

## 2017-10-06 VITALS — BP 128/87 | HR 106 | Ht 67.0 in | Wt 192.4 lb

## 2017-10-06 DIAGNOSIS — N938 Other specified abnormal uterine and vaginal bleeding: Secondary | ICD-10-CM | POA: Diagnosis not present

## 2017-10-06 DIAGNOSIS — D251 Intramural leiomyoma of uterus: Secondary | ICD-10-CM

## 2017-10-06 DIAGNOSIS — N946 Dysmenorrhea, unspecified: Secondary | ICD-10-CM

## 2017-10-06 MED ORDER — METRONIDAZOLE 500 MG PO TABS: 500.0000 mg | ORAL_TABLET | Freq: Two times a day (BID) | ORAL | 0 refills | Status: DC

## 2017-10-06 NOTE — H&P (Signed)
PRE-OPERATIVE HISTORY AND PHYSICAL EXAM  PCP:  [facility] [place] Subjective:   HPI:  Jill Rivas is a 47 y.o. G0P0000.  No LMP recorded. Patient is not currently having periods (Reason: Irregular Periods).  She presents today for a pre-op discussion and PE.  She has the following symptoms: Heavy menstrual bleeding and cramping failure of OCPs.  Known uterine fibroids. Of significant note patient is a nulligravida.  Review of Systems:   Constitutional: Denied constitutional symptoms, night sweats, recent illness, fatigue, fever, insomnia and weight loss.  Eyes: Denied eye symptoms, eye pain, photophobia, vision change and visual disturbance.  Ears/Nose/Throat/Neck: Denied ear, nose, throat or neck symptoms, hearing loss, nasal discharge, sinus congestion and sore throat.  Cardiovascular: Denied cardiovascular symptoms, arrhythmia, chest pain/pressure, edema, exercise intolerance, orthopnea and palpitations.  Respiratory: Denied pulmonary symptoms, asthma, pleuritic pain, productive sputum, cough, dyspnea and wheezing.  Gastrointestinal: Denied, gastro-esophageal reflux, melena, nausea and vomiting.  Genitourinary: See HPI for additional information.  Musculoskeletal: Denied musculoskeletal symptoms, stiffness, swelling, muscle weakness and myalgia.  Dermatologic: Denied dermatology symptoms, rash and scar.  Neurologic: Denied neurology symptoms, dizziness, headache, neck pain and syncope.  Psychiatric: Denied psychiatric symptoms, anxiety and depression.  Endocrine: Denied endocrine symptoms including hot flashes and night sweats.   OB History  Gravida Para Term Preterm AB Living  0 0 0 0 0 0  SAB TAB Ectopic Multiple Live Births  0 0 0 0 0        Past Medical History:  Diagnosis Date  . Anxiety   . CRPS (complex regional pain syndrome)   . Depression   . Kidney stones   . Migraines   . PTSD (post-traumatic stress disorder)   . Reflex  sympathetic dystrophy    left foot  . Sleep apnea    tested again approx 1 month ago and sleep apnea has resolved.    Past Surgical History:  Procedure Laterality Date  . AUGMENTATION MAMMAPLASTY Bilateral 2005   SALINE  . BREAST ENHANCEMENT SURGERY Bilateral   . BREAST SURGERY    . CHOLECYSTECTOMY    . FOOT SURGERY Left   . KIDNEY SURGERY        SOCIAL HISTORY:  Social History   Tobacco Use  Smoking Status Never Smoker  Smokeless Tobacco Never Used   Social History   Substance and Sexual Activity  Alcohol Use No  . Alcohol/week: 0.0 oz    Social History   Substance and Sexual Activity  Drug Use No    Family History  Problem Relation Age of Onset  . Cancer Mother   . Breast cancer Mother     ALLERGIES:  No known allergies  MEDS:   Current Outpatient Medications on File Prior to Visit  Medication Sig Dispense Refill  . cetirizine (ZYRTEC) 10 MG tablet Take 10 mg by mouth daily.  5  . diclofenac sodium (VOLTAREN) 1 % GEL Apply 2-4 g to painful area of skin 4 times per day if tolerated. Patient with history of strain, sprain, and muscle spasms of extremities 500 g 0  . DULoxetine (CYMBALTA) 30 MG capsule Take 30-60 mg by mouth 2 (two) times daily. Take 60 mg by mouth in the morning and 30 mg by mouth at bedtime.  0  . ibuprofen (ADVIL,MOTRIN) 200 MG tablet Take 200 mg by mouth every 6 (six) hours as needed.    . Levonorgestrel-Ethinyl Estradiol (AMETHIA,CAMRESE) 0.15-0.03 &0.01 MG tablet Take 1 tablet by mouth  at bedtime. 84 tablet 1  . loratadine (CLARITIN) 10 MG tablet Take 10 mg by mouth daily.    Marland Kitchen omeprazole (PRILOSEC) 40 MG capsule TAKE 1 TABLET BY MOUTH DAILY FOR REFLUX  5  . Oxycodone HCl 10 MG TABS Limit 4-6 tablets po daily if tolerated 180 tablet 0  . pravastatin (PRAVACHOL) 20 MG tablet TAKE 1 TABLET BY MOUTH EVERY DAY FOR HIGH CHOLESTEROL  2  . prazosin (MINIPRESS) 1 MG capsule Take 2 mg by mouth 3 (three) times daily.     . propranolol ER  (INDERAL LA) 60 MG 24 hr capsule Take 60 mg by mouth daily.    . QUEtiapine (SEROQUEL) 400 MG tablet Take 400 mg by mouth at bedtime.  2  . tiZANidine (ZANAFLEX) 4 MG tablet Take 6-8 mg by mouth 3 (three) times daily as needed.  2  . topiramate (TOPAMAX) 50 MG tablet Limit 1-2 tabs by mouth twice a day if tolerated 120 tablet 2  . triamcinolone (KENALOG) 0.025 % cream APPLY TO AFFECTED AREA TWICE DAILY FOR UP TO TWO WEEKS FOR ECZEMA  2  . zolpidem (AMBIEN) 10 MG tablet TAKE 1 TABLET BY MOUTH AT BEDTIME AS NEEDED FOR INSOMNIA  5  . norethindrone (AYGESTIN) 5 MG tablet Take 1 tablet (5 mg total) by mouth 2 (two) times daily. Then decrease to once daily as directed. (Patient not taking: Reported on 07/10/2017) 42 tablet 0   Current Facility-Administered Medications on File Prior to Visit  Medication Dose Route Frequency Provider Last Rate Last Dose  . lactated ringers infusion 1,000 mL  1,000 mL Intravenous Continuous Mohammed Kindle, MD      . lactated ringers infusion 1,000 mL  1,000 mL Intravenous Continuous Mohammed Kindle, MD        No orders of the defined types were placed in this encounter.    Physical examination BP 128/87   Pulse (!) 106   Ht 5\' 7"  (1.702 m)   Wt 192 lb 6 oz (87.3 kg)   BMI 30.13 kg/m   General NAD, Conversant  HEENT Atraumatic; Op clear with mmm.  Normo-cephalic. Pupils reactive. Anicteric sclerae  Thyroid/Neck Smooth without nodularity or enlargement. Normal ROM.  Neck Supple.  Skin No rashes, lesions or ulceration. Normal palpated skin turgor. No nodularity.  Breasts: No masses or discharge.  Symmetric.  No axillary adenopathy.  Lungs: Clear to auscultation.No rales or wheezes. Normal Respiratory effort, no retractions.  Heart: NSR.  No murmurs or rubs appreciated. No periferal edema  Abdomen: Soft.  Non-tender.  No masses.  No HSM. No hernia  Extremities: Moves all appropriately.  Normal ROM for age. No lymphadenopathy.  Neuro: Oriented to PPT.  Normal  mood. Normal affect.     Pelvic:   Vulva: Normal appearance.  No lesions.  Vagina: No lesions or abnormalities noted.  Vagina very small consistent with nulligravida status.  Support: Normal pelvic support.  Urethra No masses tenderness or scarring.  Meatus Normal size without lesions or prolapse.  Cervix: Normal ectropion.  No lesions.  Anus: Normal exam.  No lesions.  Perineum: Normal exam.  No lesions.        Bimanual   Uterus: Normal size.  Non-tender.  Mobile.  AV.  Well supported  Adnexae: No masses.  Non-tender to palpation.  Cul-de-sac: Negative for abnormality.   Assessment:   G0P0000 Patient Active Problem List   Diagnosis Date Noted  . Moderate episode of recurrent major depressive disorder ([place])   . Chest pain  07/10/2017  . Bilateral occipital neuralgia 01/10/2016  . Cervical disc disorder with radiculopathy of cervical region 02/27/2015  . 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 DDD (degenerative disc disease), cervical with radiculopathy 01/23/2015  . CRPS 1 (complex regional pain syndrome I) of lower limb 01/23/2015  . DDD (degenerative disc disease), lumbosacral 01/23/2015    1. Dysfunctional uterine bleeding   2. Fibroids, intramural   3. Dysmenorrhea    Recommend TAH because of the well supported uterus and very small vagina.  The patient reports a history of colon breast and ovarian cancer in the family.  She is requesting oophorectomy.   Plan:   Orders: No orders of the defined types were placed in this encounter.    1.  total abdominal hysterectomy and bilateral oophorectomy    Finis Bud, M.D. 10/06/2017 11:15 AM

## 2017-10-06 NOTE — Progress Notes (Signed)
PRE-OPERATIVE HISTORY AND PHYSICAL EXAM  PCP:  [facility] [place] Subjective:   HPI:  Jill Rivas is a 47 y.o. G0P0000.  No LMP recorded. Patient is not currently having periods (Reason: Irregular Periods).  She presents today for a pre-op discussion and PE.  She has the following symptoms: Heavy menstrual bleeding and cramping failure of OCPs.  Known uterine fibroids. Of significant note patient is a nulligravida.  Review of Systems:   Constitutional: Denied constitutional symptoms, night sweats, recent illness, fatigue, fever, insomnia and weight loss.  Eyes: Denied eye symptoms, eye pain, photophobia, vision change and visual disturbance.  Ears/Nose/Throat/Neck: Denied ear, nose, throat or neck symptoms, hearing loss, nasal discharge, sinus congestion and sore throat.  Cardiovascular: Denied cardiovascular symptoms, arrhythmia, chest pain/pressure, edema, exercise intolerance, orthopnea and palpitations.  Respiratory: Denied pulmonary symptoms, asthma, pleuritic pain, productive sputum, cough, dyspnea and wheezing.  Gastrointestinal: Denied, gastro-esophageal reflux, melena, nausea and vomiting.  Genitourinary: See HPI for additional information.  Musculoskeletal: Denied musculoskeletal symptoms, stiffness, swelling, muscle weakness and myalgia.  Dermatologic: Denied dermatology symptoms, rash and scar.  Neurologic: Denied neurology symptoms, dizziness, headache, neck pain and syncope.  Psychiatric: Denied psychiatric symptoms, anxiety and depression.  Endocrine: Denied endocrine symptoms including hot flashes and night sweats.   OB History  Gravida Para Term Preterm AB Living  0 0 0 0 0 0  SAB TAB Ectopic Multiple Live Births  0 0 0 0 0        Past Medical History:  Diagnosis Date  . Anxiety   . CRPS (complex regional pain syndrome)   . Depression   . Kidney stones   . Migraines   . PTSD (post-traumatic stress disorder)   . Reflex  sympathetic dystrophy    left foot  . Sleep apnea    tested again approx 1 month ago and sleep apnea has resolved.    Past Surgical History:  Procedure Laterality Date  . AUGMENTATION MAMMAPLASTY Bilateral 2005   SALINE  . BREAST ENHANCEMENT SURGERY Bilateral   . BREAST SURGERY    . CHOLECYSTECTOMY    . FOOT SURGERY Left   . KIDNEY SURGERY        SOCIAL HISTORY:  Social History   Tobacco Use  Smoking Status Never Smoker  Smokeless Tobacco Never Used   Social History   Substance and Sexual Activity  Alcohol Use No  . Alcohol/week: 0.0 oz    Social History   Substance and Sexual Activity  Drug Use No    Family History  Problem Relation Age of Onset  . Cancer Mother   . Breast cancer Mother     ALLERGIES:  No known allergies  MEDS:   Current Outpatient Medications on File Prior to Visit  Medication Sig Dispense Refill  . cetirizine (ZYRTEC) 10 MG tablet Take 10 mg by mouth daily.  5  . diclofenac sodium (VOLTAREN) 1 % GEL Apply 2-4 g to painful area of skin 4 times per day if tolerated. Patient with history of strain, sprain, and muscle spasms of extremities 500 g 0  . DULoxetine (CYMBALTA) 30 MG capsule Take 30-60 mg by mouth 2 (two) times daily. Take 60 mg by mouth in the morning and 30 mg by mouth at bedtime.  0  . ibuprofen (ADVIL,MOTRIN) 200 MG tablet Take 200 mg by mouth every 6 (six) hours as needed.    . Levonorgestrel-Ethinyl Estradiol (AMETHIA,CAMRESE) 0.15-0.03 &0.01 MG tablet Take 1 tablet by mouth  at bedtime. 84 tablet 1  . loratadine (CLARITIN) 10 MG tablet Take 10 mg by mouth daily.    Marland Kitchen omeprazole (PRILOSEC) 40 MG capsule TAKE 1 TABLET BY MOUTH DAILY FOR REFLUX  5  . Oxycodone HCl 10 MG TABS Limit 4-6 tablets po daily if tolerated 180 tablet 0  . pravastatin (PRAVACHOL) 20 MG tablet TAKE 1 TABLET BY MOUTH EVERY DAY FOR HIGH CHOLESTEROL  2  . prazosin (MINIPRESS) 1 MG capsule Take 2 mg by mouth 3 (three) times daily.     . propranolol ER  (INDERAL LA) 60 MG 24 hr capsule Take 60 mg by mouth daily.    . QUEtiapine (SEROQUEL) 400 MG tablet Take 400 mg by mouth at bedtime.  2  . tiZANidine (ZANAFLEX) 4 MG tablet Take 6-8 mg by mouth 3 (three) times daily as needed.  2  . topiramate (TOPAMAX) 50 MG tablet Limit 1-2 tabs by mouth twice a day if tolerated 120 tablet 2  . triamcinolone (KENALOG) 0.025 % cream APPLY TO AFFECTED AREA TWICE DAILY FOR UP TO TWO WEEKS FOR ECZEMA  2  . zolpidem (AMBIEN) 10 MG tablet TAKE 1 TABLET BY MOUTH AT BEDTIME AS NEEDED FOR INSOMNIA  5  . norethindrone (AYGESTIN) 5 MG tablet Take 1 tablet (5 mg total) by mouth 2 (two) times daily. Then decrease to once daily as directed. (Patient not taking: Reported on 07/10/2017) 42 tablet 0   Current Facility-Administered Medications on File Prior to Visit  Medication Dose Route Frequency Provider Last Rate Last Dose  . lactated ringers infusion 1,000 mL  1,000 mL Intravenous Continuous Mohammed Kindle, MD      . lactated ringers infusion 1,000 mL  1,000 mL Intravenous Continuous Mohammed Kindle, MD        No orders of the defined types were placed in this encounter.    Physical examination BP 128/87   Pulse (!) 106   Ht 5\' 7"  (1.702 m)   Wt 192 lb 6 oz (87.3 kg)   BMI 30.13 kg/m   General NAD, Conversant  HEENT Atraumatic; Op clear with mmm.  Normo-cephalic. Pupils reactive. Anicteric sclerae  Thyroid/Neck Smooth without nodularity or enlargement. Normal ROM.  Neck Supple.  Skin No rashes, lesions or ulceration. Normal palpated skin turgor. No nodularity.  Breasts: No masses or discharge.  Symmetric.  No axillary adenopathy.  Lungs: Clear to auscultation.No rales or wheezes. Normal Respiratory effort, no retractions.  Heart: NSR.  No murmurs or rubs appreciated. No periferal edema  Abdomen: Soft.  Non-tender.  No masses.  No HSM. No hernia  Extremities: Moves all appropriately.  Normal ROM for age. No lymphadenopathy.  Neuro: Oriented to PPT.  Normal  mood. Normal affect.     Pelvic:   Vulva: Normal appearance.  No lesions.  Vagina: No lesions or abnormalities noted.  Vagina very small consistent with nulligravida status.  Support: Normal pelvic support.  Urethra No masses tenderness or scarring.  Meatus Normal size without lesions or prolapse.  Cervix: Normal ectropion.  No lesions.  Anus: Normal exam.  No lesions.  Perineum: Normal exam.  No lesions.        Bimanual   Uterus: Normal size.  Non-tender.  Mobile.  AV.  Well supported  Adnexae: No masses.  Non-tender to palpation.  Cul-de-sac: Negative for abnormality.   Assessment:   G0P0000 Patient Active Problem List   Diagnosis Date Noted  . Moderate episode of recurrent major depressive disorder ([place])   . Chest pain  07/10/2017  . Bilateral occipital neuralgia 01/10/2016  . Cervical disc disorder with radiculopathy of cervical region 02/27/2015  . 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 DDD (degenerative disc disease), cervical with radiculopathy 01/23/2015  . CRPS 1 (complex regional pain syndrome I) of lower limb 01/23/2015  . DDD (degenerative disc disease), lumbosacral 01/23/2015    1. Dysfunctional uterine bleeding   2. Fibroids, intramural   3. Dysmenorrhea    Recommend TAH because of the well supported uterus and very small vagina.  The patient reports a history of colon breast and ovarian cancer in the family.  She is requesting oophorectomy.   Plan:   Orders: No orders of the defined types were placed in this encounter.    1.  total abdominal hysterectomy and bilateral oophorectomy  Pre-op discussions regarding Risks and Benefits of her scheduled surgery.  TAH The procedure of Total Abdominal Hysterectomy was described to the patient in detail.  We reviewed the rationale for Hysterectomy and the patient was again informed of other non-surgical management possibilities for her condition.  She has considered these other options, and desires a Hysterectomy.   We have reviewed the fact that Hysterectomy is permanent and that following the procedure she will not be able to become pregnant or bear children.  We have discussed the following risk factors specifically, and the patient has also been informed that additional complications not mentioned may develop;  damage to bowel, bladder, ureters, or to other internal organs, bleeding, infection and the risk from anesthesia.  We have discussed the procedure itself in detail and she has an informed understanding of this surgery.  We have also discussed the recovery period in which physical and sexual activity will be restricted for a varying degree of time, often 3 - 6 weeks.  I have answered all of her questions and I believe she has an informed understanding of Abdominal Hysterectomy. Oophorectomy The option of Oophorectomy has been discussed with the patient.  Detailed risk/benefits have been reviewed.  The risks discussed include, but are not limited to, hemorrhage, infection, damage to ureter or other internal organ, and Ovarian Remnant Syndrome.  The benefits include a significant decrease in the risk of Ovarian Cancer and in benign Ovarian disease.  The risk of Ovarian CA has been estimated at 1 in 52.  This is a relatively small risk.  However, should Ovarian CA develop, it is often found late in the course of the disease.  We have also discussed the role of inheritance in the development of Ovarian disease.  Some women, who have close relatives with Ovarian CA, have a higher than 1 in 70 risk of Ovarian CA.  The benefits of Estrogen replacement therapy following Oophorectomy has been stressed.  If she is premenopausal, we have discussed the fact that this procedure will make her permanently sterile and that premature menopause will result if no ERT is begun.  I have answered all of her questions, and I believe that she has an adequate and informed understanding of the risks and benefits of Oophorectomy. Patient  strongly desires bilateral oophorectomy.  Finis Bud, M.D. 10/06/2017 11:15 AM

## 2017-10-06 NOTE — H&P (View-Only) (Signed)
PRE-OPERATIVE HISTORY AND PHYSICAL EXAM  PCP:  [facility] [place] Subjective:   HPI:  Jill Rivas is a 47 y.o. G0P0000.  No LMP recorded. Patient is not currently having periods (Reason: Irregular Periods).  She presents today for a pre-op discussion and PE.  She has the following symptoms: Heavy menstrual bleeding and cramping failure of OCPs.  Known uterine fibroids. Of significant note patient is a nulligravida.  Review of Systems:   Constitutional: Denied constitutional symptoms, night sweats, recent illness, fatigue, fever, insomnia and weight loss.  Eyes: Denied eye symptoms, eye pain, photophobia, vision change and visual disturbance.  Ears/Nose/Throat/Neck: Denied ear, nose, throat or neck symptoms, hearing loss, nasal discharge, sinus congestion and sore throat.  Cardiovascular: Denied cardiovascular symptoms, arrhythmia, chest pain/pressure, edema, exercise intolerance, orthopnea and palpitations.  Respiratory: Denied pulmonary symptoms, asthma, pleuritic pain, productive sputum, cough, dyspnea and wheezing.  Gastrointestinal: Denied, gastro-esophageal reflux, melena, nausea and vomiting.  Genitourinary: See HPI for additional information.  Musculoskeletal: Denied musculoskeletal symptoms, stiffness, swelling, muscle weakness and myalgia.  Dermatologic: Denied dermatology symptoms, rash and scar.  Neurologic: Denied neurology symptoms, dizziness, headache, neck pain and syncope.  Psychiatric: Denied psychiatric symptoms, anxiety and depression.  Endocrine: Denied endocrine symptoms including hot flashes and night sweats.   OB History  Gravida Para Term Preterm AB Living  0 0 0 0 0 0  SAB TAB Ectopic Multiple Live Births  0 0 0 0 0        Past Medical History:  Diagnosis Date  . Anxiety   . CRPS (complex regional pain syndrome)   . Depression   . Kidney stones   . Migraines   . PTSD (post-traumatic stress disorder)   . Reflex  sympathetic dystrophy    left foot  . Sleep apnea    tested again approx 1 month ago and sleep apnea has resolved.    Past Surgical History:  Procedure Laterality Date  . AUGMENTATION MAMMAPLASTY Bilateral 2005   SALINE  . BREAST ENHANCEMENT SURGERY Bilateral   . BREAST SURGERY    . CHOLECYSTECTOMY    . FOOT SURGERY Left   . KIDNEY SURGERY        SOCIAL HISTORY:  Social History   Tobacco Use  Smoking Status Never Smoker  Smokeless Tobacco Never Used   Social History   Substance and Sexual Activity  Alcohol Use No  . Alcohol/week: 0.0 oz    Social History   Substance and Sexual Activity  Drug Use No    Family History  Problem Relation Age of Onset  . Cancer Mother   . Breast cancer Mother     ALLERGIES:  No known allergies  MEDS:   Current Outpatient Medications on File Prior to Visit  Medication Sig Dispense Refill  . cetirizine (ZYRTEC) 10 MG tablet Take 10 mg by mouth daily.  5  . diclofenac sodium (VOLTAREN) 1 % GEL Apply 2-4 g to painful area of skin 4 times per day if tolerated. Patient with history of strain, sprain, and muscle spasms of extremities 500 g 0  . DULoxetine (CYMBALTA) 30 MG capsule Take 30-60 mg by mouth 2 (two) times daily. Take 60 mg by mouth in the morning and 30 mg by mouth at bedtime.  0  . ibuprofen (ADVIL,MOTRIN) 200 MG tablet Take 200 mg by mouth every 6 (six) hours as needed.    . Levonorgestrel-Ethinyl Estradiol (AMETHIA,CAMRESE) 0.15-0.03 &0.01 MG tablet Take 1 tablet by mouth  at bedtime. 84 tablet 1  . loratadine (CLARITIN) 10 MG tablet Take 10 mg by mouth daily.    Marland Kitchen omeprazole (PRILOSEC) 40 MG capsule TAKE 1 TABLET BY MOUTH DAILY FOR REFLUX  5  . Oxycodone HCl 10 MG TABS Limit 4-6 tablets po daily if tolerated 180 tablet 0  . pravastatin (PRAVACHOL) 20 MG tablet TAKE 1 TABLET BY MOUTH EVERY DAY FOR HIGH CHOLESTEROL  2  . prazosin (MINIPRESS) 1 MG capsule Take 2 mg by mouth 3 (three) times daily.     . propranolol ER  (INDERAL LA) 60 MG 24 hr capsule Take 60 mg by mouth daily.    . QUEtiapine (SEROQUEL) 400 MG tablet Take 400 mg by mouth at bedtime.  2  . tiZANidine (ZANAFLEX) 4 MG tablet Take 6-8 mg by mouth 3 (three) times daily as needed.  2  . topiramate (TOPAMAX) 50 MG tablet Limit 1-2 tabs by mouth twice a day if tolerated 120 tablet 2  . triamcinolone (KENALOG) 0.025 % cream APPLY TO AFFECTED AREA TWICE DAILY FOR UP TO TWO WEEKS FOR ECZEMA  2  . zolpidem (AMBIEN) 10 MG tablet TAKE 1 TABLET BY MOUTH AT BEDTIME AS NEEDED FOR INSOMNIA  5  . norethindrone (AYGESTIN) 5 MG tablet Take 1 tablet (5 mg total) by mouth 2 (two) times daily. Then decrease to once daily as directed. (Patient not taking: Reported on 07/10/2017) 42 tablet 0   Current Facility-Administered Medications on File Prior to Visit  Medication Dose Route Frequency Provider Last Rate Last Dose  . lactated ringers infusion 1,000 mL  1,000 mL Intravenous Continuous Mohammed Kindle, MD      . lactated ringers infusion 1,000 mL  1,000 mL Intravenous Continuous Mohammed Kindle, MD        No orders of the defined types were placed in this encounter.    Physical examination BP 128/87   Pulse (!) 106   Ht 5\' 7"  (1.702 m)   Wt 192 lb 6 oz (87.3 kg)   BMI 30.13 kg/m   General NAD, Conversant  HEENT Atraumatic; Op clear with mmm.  Normo-cephalic. Pupils reactive. Anicteric sclerae  Thyroid/Neck Smooth without nodularity or enlargement. Normal ROM.  Neck Supple.  Skin No rashes, lesions or ulceration. Normal palpated skin turgor. No nodularity.  Breasts: No masses or discharge.  Symmetric.  No axillary adenopathy.  Lungs: Clear to auscultation.No rales or wheezes. Normal Respiratory effort, no retractions.  Heart: NSR.  No murmurs or rubs appreciated. No periferal edema  Abdomen: Soft.  Non-tender.  No masses.  No HSM. No hernia  Extremities: Moves all appropriately.  Normal ROM for age. No lymphadenopathy.  Neuro: Oriented to PPT.  Normal  mood. Normal affect.     Pelvic:   Vulva: Normal appearance.  No lesions.  Vagina: No lesions or abnormalities noted.  Vagina very small consistent with nulligravida status.  Support: Normal pelvic support.  Urethra No masses tenderness or scarring.  Meatus Normal size without lesions or prolapse.  Cervix: Normal ectropion.  No lesions.  Anus: Normal exam.  No lesions.  Perineum: Normal exam.  No lesions.        Bimanual   Uterus: Normal size.  Non-tender.  Mobile.  AV.  Well supported  Adnexae: No masses.  Non-tender to palpation.  Cul-de-sac: Negative for abnormality.   Assessment:   G0P0000 Patient Active Problem List   Diagnosis Date Noted  . Moderate episode of recurrent major depressive disorder ([place])   . Chest pain  07/10/2017  . Bilateral occipital neuralgia 01/10/2016  . Cervical disc disorder with radiculopathy of cervical region 02/27/2015  . 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 DDD (degenerative disc disease), cervical with radiculopathy 01/23/2015  . CRPS 1 (complex regional pain syndrome I) of lower limb 01/23/2015  . DDD (degenerative disc disease), lumbosacral 01/23/2015    1. Dysfunctional uterine bleeding   2. Fibroids, intramural   3. Dysmenorrhea    Recommend TAH because of the well supported uterus and very small vagina.  The patient reports a history of colon breast and ovarian cancer in the family.  She is requesting oophorectomy.   Plan:   Orders: No orders of the defined types were placed in this encounter.    1.  total abdominal hysterectomy and bilateral oophorectomy    Finis Bud, M.D. 10/06/2017 11:15 AM

## 2017-10-22 ENCOUNTER — Encounter
Admission: RE | Admit: 2017-10-22 | Discharge: 2017-10-22 | Disposition: A | Discharge status: Home / Self Care | Payer: Medicare Other | Source: Ambulatory Visit | Attending: Obstetrics and Gynecology | Admitting: Obstetrics and Gynecology

## 2017-10-22 ENCOUNTER — Other Ambulatory Visit: Payer: Self-pay

## 2017-10-22 DIAGNOSIS — N938 Other specified abnormal uterine and vaginal bleeding: Secondary | ICD-10-CM | POA: Insufficient documentation

## 2017-10-22 DIAGNOSIS — Z01818 Encounter for other preprocedural examination: Secondary | ICD-10-CM | POA: Diagnosis present

## 2017-10-22 DIAGNOSIS — N946 Dysmenorrhea, unspecified: Secondary | ICD-10-CM | POA: Insufficient documentation

## 2017-10-22 DIAGNOSIS — D251 Intramural leiomyoma of uterus: Secondary | ICD-10-CM | POA: Diagnosis not present

## 2017-10-22 HISTORY — DX: Essential (primary) hypertension: I10

## 2017-10-22 HISTORY — DX: Cardiac arrhythmia, unspecified: I49.9

## 2017-10-22 HISTORY — DX: Fracture of neck, unspecified, initial encounter: S12.9XXA

## 2017-10-22 HISTORY — DX: Gastro-esophageal reflux disease without esophagitis: K21.9

## 2017-10-22 HISTORY — DX: Other intervertebral disc degeneration, thoracolumbar region: M51.35

## 2017-10-22 HISTORY — DX: Nausea with vomiting, unspecified: R11.2

## 2017-10-22 HISTORY — DX: Occipital neuralgia: M54.81

## 2017-10-22 HISTORY — DX: Complex regional pain syndrome i of unspecified lower limb: G90.529

## 2017-10-22 HISTORY — DX: Personal history of urinary calculi: Z87.442

## 2017-10-22 HISTORY — DX: Other specified postprocedural states: Z98.890

## 2017-10-22 HISTORY — DX: Complex regional pain syndrome I, unspecified: G90.50

## 2017-10-22 LAB — BASIC METABOLIC PANEL
Anion gap: 7 (ref 5–15)
BUN: 13 mg/dL (ref 6–20)
CALCIUM: 9 mg/dL (ref 8.9–10.3)
CHLORIDE: 107 mmol/L (ref 101–111)
CO2: 26 mmol/L (ref 22–32)
CREATININE: 1.03 mg/dL — AB (ref 0.44–1.00)
GFR calc Af Amer: >60 mL/min (ref >60)
GFR calc non Af Amer: >60 mL/min (ref >60)
GLUCOSE: 106 mg/dL — AB (ref 65–99)
Potassium: 4.1 mmol/L (ref 3.5–5.1)
Sodium: 140 mmol/L (ref 135–145)

## 2017-10-22 LAB — CBC
HCT: 39.2 % (ref 35.0–47.0)
Hemoglobin: 13.1 g/dL (ref 12.0–16.0)
MCH: 29.4 pg (ref 26.0–34.0)
MCHC: 33.3 g/dL (ref 32.0–36.0)
MCV: 88.3 fL (ref 80.0–100.0)
Platelets: 254 10*3/uL (ref 150–440)
RBC: 4.44 MIL/uL (ref 3.80–5.20)
RDW: 12.6 % (ref 11.5–14.5)
WBC: 5.5 10*3/uL (ref 3.6–11.0)

## 2017-10-22 NOTE — Patient Instructions (Addendum)
Your procedure is scheduled on: Monday, February 4th  Report to [facility]  To find out your arrival time please call [phone] between 1PM - 3PM on Friday, February 1st  Remember: Instructions that are not followed completely may result in serious medical risk, up to and including death, or upon the discretion of your surgeon and anesthesiologist your surgery may need to be rescheduled.     _X__ 1. Do not eat food after midnight the night before your procedure.                 No gum chewing or hard candies.                   You may drink clear liquids up to 2 hours                 before you are scheduled to arrive for your surgery.                   Clear Liquids include:  water, apple juice without pulp, clear carbohydrate                 drink such as Clearfast of Gatorade, Black Coffee or Tea (Do not add                 anything to coffee or tea).  __X__2.  On the morning of surgery brush your teeth with toothpaste and water,                             you may rinse your mouth with mouthwash if you wish.                                         Do not swallow any toothpaste of mouthwash.     _X__ 3.  No Alcohol for 24 hours before or after surgery.   _X__ 4.  Do Not Smoke or use e-cigarettes For 24 Hours Prior to Your Surgery.                 Do not use any chewable tobacco products for at least 6 hours prior to                 surgery.  ____  5.  Bring all medications with you on the day of surgery if instructed.   ____  6.  Notify your doctor if there is any change in your medical condition      (cold, fever, infections).     Do not wear jewelry, make-up, hairpins, clips or nail polish. Do not wear lotions, powders, or perfumes. You may wear deodorant. Do not shave 48 hours prior to surgery. Men may shave face and neck. Do not bring valuables to the hospital.    [facility] is not responsible for any belongings or  valuables.  Contacts, dentures or bridgework may not be worn into surgery. [place] WITH YOU. For patients admitted to the hospital, discharge time is determined by your treatment team.   Patients discharged the day of surgery will not be allowed to drive home.   Please read over the following fact sheets that you were given:   PREPARING FOR SURGERY  MEDICAL DIRECTIVES              ____ Take these medicines the morning of surgery with A SIP OF WATER:    1. INDERAL  2. MINIPRESS  3. PRILOSEC (TAKE THE NIGHT BEFORE AND MORNING OF SURGERY)  4.OXYCODONE, IF NEEDED  5. KLONOPIN  6. CYMBLATA                   ____ Fleet Enema (as directed)   __X__ Use CHG Soap as directed  __X___ Stop ALL ASPIRIN PRODUCTS AS OF TODAY  __X__ Stop Anti-inflammatories AS OF TODAY.                        THIS INCLUDES IBUPROFEN / MOTRIN / ADVIL / ALEVE / NAPROSYN / GOODYS POWDERS   ____ Stop supplements until after surgery. STOP VOLTAREN GEL AS OF TODAY.   ____ Bring C-Pap to the hospital.   CONTINUE TAKING:             SEROQUEL             PRAVACHOL             ZANAFLEX             TOPAMAX             VITAMIN B12             ZYRTEC BUT DO NOT TAKE THESE ON THE MORNING OF SURGERY.

## 2017-10-23 ENCOUNTER — Encounter
Admission: RE | Admit: 2017-10-23 | Discharge: 2017-10-23 | Disposition: A | Discharge status: Home / Self Care | Payer: Medicare Other | Source: Ambulatory Visit | Attending: Obstetrics and Gynecology | Admitting: Obstetrics and Gynecology

## 2017-10-23 LAB — TYPE AND SCREEN
ABO/RH(D): B POS
Antibody Screen: NEGATIVE

## 2017-10-25 MED ORDER — CEFAZOLIN SODIUM-DEXTROSE 2-4 GM/100ML-% IV SOLN
2.0000 g | INTRAVENOUS | Status: AC
  Administered 2017-10-26: 2 g via INTRAVENOUS

## 2017-10-26 ENCOUNTER — Inpatient Hospital Stay: Payer: Medicare Other | Admitting: Anesthesiology

## 2017-10-26 ENCOUNTER — Inpatient Hospital Stay: Payer: Medicare Other | Admitting: Registered Nurse

## 2017-10-26 ENCOUNTER — Encounter: Payer: Self-pay | Admitting: Emergency Medicine

## 2017-10-26 ENCOUNTER — Ambulatory Visit: Admit: 2017-10-26 | Payer: PRIVATE HEALTH INSURANCE | Admitting: Obstetrics and Gynecology

## 2017-10-26 ENCOUNTER — Encounter
Admission: RE | Disposition: A | Discharge status: Home / Self Care | Payer: Self-pay | Source: Ambulatory Visit | Attending: Obstetrics and Gynecology

## 2017-10-26 ENCOUNTER — Inpatient Hospital Stay
Admission: RE | Admit: 2017-10-26 | Discharge: 2017-10-29 | DRG: 742 | Disposition: A | Discharge status: Home / Self Care | Payer: Medicare Other | Source: Ambulatory Visit | Attending: Obstetrics and Gynecology | Admitting: Obstetrics and Gynecology

## 2017-10-26 DIAGNOSIS — K219 Gastro-esophageal reflux disease without esophagitis: Secondary | ICD-10-CM | POA: Diagnosis present

## 2017-10-26 DIAGNOSIS — N946 Dysmenorrhea, unspecified: Secondary | ICD-10-CM | POA: Diagnosis present

## 2017-10-26 DIAGNOSIS — I10 Essential (primary) hypertension: Secondary | ICD-10-CM | POA: Diagnosis present

## 2017-10-26 DIAGNOSIS — N736 Female pelvic peritoneal adhesions (postinfective): Secondary | ICD-10-CM

## 2017-10-26 DIAGNOSIS — D251 Intramural leiomyoma of uterus: Secondary | ICD-10-CM | POA: Diagnosis present

## 2017-10-26 DIAGNOSIS — N99820 Postprocedural hemorrhage and hematoma of a genitourinary system organ or structure following a genitourinary system procedure: Secondary | ICD-10-CM | POA: Diagnosis not present

## 2017-10-26 DIAGNOSIS — N926 Irregular menstruation, unspecified: Secondary | ICD-10-CM | POA: Diagnosis present

## 2017-10-26 DIAGNOSIS — N938 Other specified abnormal uterine and vaginal bleeding: Secondary | ICD-10-CM | POA: Diagnosis present

## 2017-10-26 DIAGNOSIS — D259 Leiomyoma of uterus, unspecified: Secondary | ICD-10-CM | POA: Diagnosis present

## 2017-10-26 DIAGNOSIS — Z9889 Other specified postprocedural states: Secondary | ICD-10-CM

## 2017-10-26 HISTORY — PX: REPAIR VAGINAL CUFF: SHX6067

## 2017-10-26 LAB — HCG, QUANTITATIVE, PREGNANCY: HCG, BETA CHAIN, QUANT, S: 5 m[IU]/mL — AB (ref <5)

## 2017-10-26 LAB — ABO/RH: ABO/RH(D): B POS

## 2017-10-26 LAB — POCT PREGNANCY, URINE: Preg Test, Ur: NEGATIVE

## 2017-10-26 SURGERY — HYSTERECTOMY, ABDOMINAL, WITH SALPINGO-OOPHORECTOMY
Anesthesia: General | Laterality: Bilateral | Wound class: Clean Contaminated

## 2017-10-26 SURGERY — REPAIR, VAGINAL CUFF
Anesthesia: General | Site: Vagina | Wound class: Clean Contaminated

## 2017-10-26 MED ORDER — FENTANYL CITRATE (PF) 100 MCG/2ML IJ SOLN
50.0000 ug | Freq: Once | INTRAMUSCULAR | Status: AC
  Administered 2017-10-26: 50 ug via INTRAVENOUS

## 2017-10-26 MED ORDER — SUGAMMADEX SODIUM 200 MG/2ML IV SOLN
INTRAVENOUS | Status: AC
  Filled 2017-10-26: qty 2

## 2017-10-26 MED ORDER — FENTANYL CITRATE (PF) 100 MCG/2ML IJ SOLN
INTRAMUSCULAR | Status: AC
  Administered 2017-10-26: 25 ug via INTRAVENOUS
  Filled 2017-10-26: qty 2

## 2017-10-26 MED ORDER — SUGAMMADEX SODIUM 500 MG/5ML IV SOLN
INTRAVENOUS | Status: DC | PRN
  Administered 2017-10-26: 180 mg via INTRAVENOUS

## 2017-10-26 MED ORDER — SCOPOLAMINE 1 MG/3DAYS TD PT72
1.0000 | MEDICATED_PATCH | TRANSDERMAL | Status: DC
  Administered 2017-10-26: 1.5 mg via TRANSDERMAL
  Filled 2017-10-26: qty 1

## 2017-10-26 MED ORDER — PROPOFOL 10 MG/ML IV BOLUS
INTRAVENOUS | Status: AC
  Filled 2017-10-26: qty 20

## 2017-10-26 MED ORDER — LACTATED RINGERS IV SOLN
INTRAVENOUS | Status: DC
  Administered 2017-10-26 (×4): via INTRAVENOUS

## 2017-10-26 MED ORDER — HYDROMORPHONE HCL 1 MG/ML IJ SOLN
0.5000 mg | INTRAMUSCULAR | Status: DC | PRN
  Administered 2017-10-26 (×2): 0.5 mg via INTRAVENOUS

## 2017-10-26 MED ORDER — MIDAZOLAM HCL 2 MG/2ML IJ SOLN
INTRAMUSCULAR | Status: AC
  Filled 2017-10-26: qty 2

## 2017-10-26 MED ORDER — DEXAMETHASONE SODIUM PHOSPHATE 10 MG/ML IJ SOLN
INTRAMUSCULAR | Status: DC | PRN
  Administered 2017-10-26: 10 mg via INTRAVENOUS

## 2017-10-26 MED ORDER — FENTANYL CITRATE (PF) 100 MCG/2ML IJ SOLN
INTRAMUSCULAR | Status: DC | PRN
  Administered 2017-10-26 (×4): 50 ug via INTRAVENOUS

## 2017-10-26 MED ORDER — ACETAMINOPHEN 10 MG/ML IV SOLN
INTRAVENOUS | Status: AC
  Filled 2017-10-26: qty 100

## 2017-10-26 MED ORDER — PROPOFOL 10 MG/ML IV BOLUS
INTRAVENOUS | Status: DC | PRN
  Administered 2017-10-26: 110 mg via INTRAVENOUS

## 2017-10-26 MED ORDER — ONDANSETRON HCL 4 MG/2ML IJ SOLN
INTRAMUSCULAR | Status: DC | PRN
  Administered 2017-10-26: 4 mg via INTRAVENOUS

## 2017-10-26 MED ORDER — FENTANYL CITRATE (PF) 100 MCG/2ML IJ SOLN
INTRAMUSCULAR | Status: AC
  Filled 2017-10-26: qty 2

## 2017-10-26 MED ORDER — PROPOFOL 10 MG/ML IV BOLUS
INTRAVENOUS | Status: DC | PRN
  Administered 2017-10-26: 150 mg via INTRAVENOUS

## 2017-10-26 MED ORDER — KETOROLAC TROMETHAMINE 30 MG/ML IJ SOLN
INTRAMUSCULAR | Status: AC
  Administered 2017-10-26: 30 mg via INTRAVENOUS
  Filled 2017-10-26: qty 1

## 2017-10-26 MED ORDER — FENTANYL CITRATE (PF) 100 MCG/2ML IJ SOLN
25.0000 ug | INTRAMUSCULAR | Status: AC | PRN
  Administered 2017-10-26 (×4): 25 ug via INTRAVENOUS
  Administered 2017-10-26: 100 ug via INTRAVENOUS
  Administered 2017-10-26: 25 ug via INTRAVENOUS

## 2017-10-26 MED ORDER — ESTROGENS CONJUGATED 0.3 MG PO TABS
0.9000 mg | ORAL_TABLET | Freq: Every day | ORAL | Status: DC
  Administered 2017-10-27 – 2017-10-29 (×3): 0.9 mg via ORAL
  Filled 2017-10-26 (×4): qty 3

## 2017-10-26 MED ORDER — DEXAMETHASONE SODIUM PHOSPHATE 10 MG/ML IJ SOLN
INTRAMUSCULAR | Status: AC
  Filled 2017-10-26: qty 1

## 2017-10-26 MED ORDER — MIDAZOLAM HCL 2 MG/2ML IJ SOLN
INTRAMUSCULAR | Status: AC
  Administered 2017-10-26: 2 mg
  Filled 2017-10-26: qty 2

## 2017-10-26 MED ORDER — ONDANSETRON HCL 4 MG/2ML IJ SOLN
4.0000 mg | Freq: Once | INTRAMUSCULAR | Status: AC | PRN
  Administered 2017-10-26: 4 mg via INTRAVENOUS

## 2017-10-26 MED ORDER — ONDANSETRON HCL 4 MG/2ML IJ SOLN
INTRAMUSCULAR | Status: AC
  Administered 2017-10-26: 4 mg via INTRAVENOUS
  Filled 2017-10-26: qty 2

## 2017-10-26 MED ORDER — EPHEDRINE SULFATE 50 MG/ML IJ SOLN
INTRAMUSCULAR | Status: DC | PRN
  Administered 2017-10-26 (×2): 10 mg via INTRAVENOUS
  Administered 2017-10-26: 5 mg via INTRAVENOUS
  Administered 2017-10-26: 10 mg via INTRAVENOUS

## 2017-10-26 MED ORDER — ROCURONIUM BROMIDE 100 MG/10ML IV SOLN
INTRAVENOUS | Status: DC | PRN
  Administered 2017-10-26: 30 mg via INTRAVENOUS

## 2017-10-26 MED ORDER — EPHEDRINE SULFATE 50 MG/ML IJ SOLN
INTRAMUSCULAR | Status: DC | PRN
  Administered 2017-10-26 (×3): 15 mg via INTRAVENOUS

## 2017-10-26 MED ORDER — EPHEDRINE SULFATE 50 MG/ML IJ SOLN
INTRAMUSCULAR | Status: AC
  Filled 2017-10-26: qty 1

## 2017-10-26 MED ORDER — SUGAMMADEX SODIUM 200 MG/2ML IV SOLN
INTRAVENOUS | Status: DC | PRN
  Administered 2017-10-26: 200 mg via INTRAVENOUS

## 2017-10-26 MED ORDER — LACTATED RINGERS IV SOLN: INTRAVENOUS | Status: DC

## 2017-10-26 MED ORDER — ROCURONIUM BROMIDE 100 MG/10ML IV SOLN
INTRAVENOUS | Status: DC | PRN
  Administered 2017-10-26: 10 mg via INTRAVENOUS
  Administered 2017-10-26: 40 mg via INTRAVENOUS
  Administered 2017-10-26: 20 mg via INTRAVENOUS

## 2017-10-26 MED ORDER — DEXTROSE IN LACTATED RINGERS 5 % IV SOLN
INTRAVENOUS | Status: DC
  Administered 2017-10-26 – 2017-10-28 (×5): via INTRAVENOUS

## 2017-10-26 MED ORDER — HYDROMORPHONE HCL 1 MG/ML IJ SOLN
INTRAMUSCULAR | Status: AC
  Administered 2017-10-26: 0.5 mg via INTRAVENOUS
  Filled 2017-10-26: qty 1

## 2017-10-26 MED ORDER — ONDANSETRON HCL 4 MG/2ML IJ SOLN
4.0000 mg | Freq: Four times a day (QID) | INTRAMUSCULAR | Status: DC | PRN
  Administered 2017-10-26 – 2017-10-28 (×6): 4 mg via INTRAVENOUS
  Filled 2017-10-26 (×6): qty 2

## 2017-10-26 MED ORDER — KETOROLAC TROMETHAMINE 30 MG/ML IJ SOLN
30.0000 mg | Freq: Four times a day (QID) | INTRAMUSCULAR | Status: DC
  Filled 2017-10-26: qty 1

## 2017-10-26 MED ORDER — KETOROLAC TROMETHAMINE 30 MG/ML IJ SOLN
30.0000 mg | Freq: Once | INTRAMUSCULAR | Status: AC
  Administered 2017-10-26: 30 mg via INTRAVENOUS

## 2017-10-26 MED ORDER — SIMETHICONE 80 MG PO CHEW
80.0000 mg | CHEWABLE_TABLET | Freq: Four times a day (QID) | ORAL | Status: DC | PRN
  Administered 2017-10-27 – 2017-10-28 (×3): 80 mg via ORAL
  Filled 2017-10-26 (×3): qty 1

## 2017-10-26 MED ORDER — LIDOCAINE HCL (CARDIAC) 20 MG/ML IV SOLN
INTRAVENOUS | Status: DC | PRN
  Administered 2017-10-26: 80 mg via INTRAVENOUS

## 2017-10-26 MED ORDER — LIDOCAINE HCL (CARDIAC) 20 MG/ML IV SOLN
INTRAVENOUS | Status: DC | PRN
  Administered 2017-10-26: 40 mg via INTRAVENOUS

## 2017-10-26 MED ORDER — HYDROMORPHONE HCL 1 MG/ML IJ SOLN
0.2000 mg | INTRAMUSCULAR | Status: AC | PRN
  Administered 2017-10-26 – 2017-10-27 (×4): 0.4 mg via INTRAVENOUS
  Administered 2017-10-27: 0.6 mg via INTRAVENOUS
  Administered 2017-10-27 (×2): 0.4 mg via INTRAVENOUS
  Filled 2017-10-26 (×7): qty 1

## 2017-10-26 MED ORDER — FAMOTIDINE 20 MG PO TABS
ORAL_TABLET | ORAL | Status: AC
  Filled 2017-10-26: qty 1

## 2017-10-26 MED ORDER — SCOPOLAMINE 1 MG/3DAYS TD PT72
MEDICATED_PATCH | TRANSDERMAL | Status: AC
  Filled 2017-10-26: qty 1

## 2017-10-26 MED ORDER — LIDOCAINE HCL (PF) 2 % IJ SOLN
INTRAMUSCULAR | Status: AC
  Filled 2017-10-26: qty 10

## 2017-10-26 MED ORDER — CEFAZOLIN SODIUM-DEXTROSE 2-4 GM/100ML-% IV SOLN
INTRAVENOUS | Status: AC
  Filled 2017-10-26: qty 100

## 2017-10-26 MED ORDER — IBUPROFEN 600 MG PO TABS
600.0000 mg | ORAL_TABLET | Freq: Four times a day (QID) | ORAL | Status: DC | PRN
  Administered 2017-10-27 – 2017-10-29 (×7): 600 mg via ORAL
  Filled 2017-10-26 (×7): qty 1

## 2017-10-26 MED ORDER — MIDAZOLAM HCL 2 MG/2ML IJ SOLN
INTRAMUSCULAR | Status: DC | PRN
  Administered 2017-10-26: 2 mg via INTRAVENOUS

## 2017-10-26 MED ORDER — KETOROLAC TROMETHAMINE 30 MG/ML IJ SOLN
30.0000 mg | Freq: Four times a day (QID) | INTRAMUSCULAR | Status: DC
  Administered 2017-10-26 – 2017-10-27 (×3): 30 mg via INTRAVENOUS
  Filled 2017-10-26 (×2): qty 1

## 2017-10-26 SURGICAL SUPPLY — 41 items
ADH LQ OCL WTPRF AMP STRL LF (MISCELLANEOUS) ×1
ADHESIVE MASTISOL STRL (MISCELLANEOUS) ×2 IMPLANT
BLADE SURG SZ10 CARB STEEL (BLADE) ×2 IMPLANT
CANISTER SUCT 1200ML W/VALVE (MISCELLANEOUS) ×2 IMPLANT
CELL SAVER LIPIGURD (MISCELLANEOUS) IMPLANT
CHLORAPREP W/TINT 26ML (MISCELLANEOUS) ×2 IMPLANT
DEVICE RETRIEVAL ALEXIS 14 (MISCELLANEOUS) IMPLANT
DRAPE LAPAROTOMY 100X77 ABD (DRAPES) IMPLANT
DRAPE LAPAROTOMY TRNSV 106X77 (MISCELLANEOUS) IMPLANT
DRSG TELFA 3X8 NADH (GAUZE/BANDAGES/DRESSINGS) ×2 IMPLANT
ELECT BLADE 6 FLAT ULTRCLN (ELECTRODE) ×2 IMPLANT
ELECT BLADE 6.5 EXT (BLADE) ×2 IMPLANT
ELECT CAUTERY BLADE 6.4 (BLADE) ×2 IMPLANT
ELECT REM PT RETURN 9FT ADLT (ELECTROSURGICAL) ×2
ELECTRODE REM PT RTRN 9FT ADLT (ELECTROSURGICAL) ×1 IMPLANT
EXTRT SYSTEM ALEXIS 14CM (MISCELLANEOUS)
GAUZE SPONGE 4X4 12PLY STRL (GAUZE/BANDAGES/DRESSINGS) ×2 IMPLANT
GLOVE ORTHO TXT STRL SZ7.5 (GLOVE) ×4 IMPLANT
GOWN STRL REUS W/ TWL LRG LVL3 (GOWN DISPOSABLE) ×2 IMPLANT
GOWN STRL REUS W/ TWL XL LVL3 (GOWN DISPOSABLE) ×1 IMPLANT
GOWN STRL REUS W/TWL LRG LVL3 (GOWN DISPOSABLE) ×4
GOWN STRL REUS W/TWL XL LVL3 (GOWN DISPOSABLE) ×2
KIT TURNOVER KIT A (KITS) ×2 IMPLANT
LABEL OR SOLS (LABEL) ×2 IMPLANT
LIGASURE IMPACT 36 18CM CVD LR (INSTRUMENTS) IMPLANT
NS IRRIG 1000ML POUR BTL (IV SOLUTION) ×2 IMPLANT
PACK BASIN MAJOR ARMC (MISCELLANEOUS) ×2 IMPLANT
PAD DRESSING TELFA 3X8 NADH (GAUZE/BANDAGES/DRESSINGS) ×1 IMPLANT
PAD OB MATERNITY 4.3X12.25 (PERSONAL CARE ITEMS) ×2 IMPLANT
SPONGE LAP 18X18 5 PK (GAUZE/BANDAGES/DRESSINGS) ×2 IMPLANT
SPONGE XRAY 4X4 16PLY STRL (MISCELLANEOUS) ×2 IMPLANT
STAPLER SKIN PROX 35W (STAPLE) IMPLANT
STRIP CLOSURE SKIN 1/2X4 (GAUZE/BANDAGES/DRESSINGS) ×2 IMPLANT
SUT VIC AB 1 CT1 18XCR BRD 8 (SUTURE) IMPLANT
SUT VIC AB 1 CT1 36 (SUTURE) ×4 IMPLANT
SUT VIC AB 1 CT1 8-18 (SUTURE) ×2
SUT VICRYL+ 3-0 36IN CT-1 (SUTURE) ×2 IMPLANT
SUTURE VIC 1-0 (SUTURE) ×2 IMPLANT
TOWEL OR 17X26 4PK STRL BLUE (TOWEL DISPOSABLE) ×2 IMPLANT
TRAY FOLEY W/METER SILVER 16FR (SET/KITS/TRAYS/PACK) ×2 IMPLANT
TRAY PREP VAG/GEN (MISCELLANEOUS) ×2 IMPLANT

## 2017-10-26 SURGICAL SUPPLY — 4 items
DRAPE LEGGINS SURG 28X43 STRL (DRAPES) ×1 IMPLANT
PACK BASIN MAJOR ARMC (MISCELLANEOUS) ×1 IMPLANT
SPONGE LAP 18X18 5 PK (GAUZE/BANDAGES/DRESSINGS) ×2 IMPLANT
SUT VIC AB 0 CT2 27 (SUTURE) ×3 IMPLANT

## 2017-10-26 NOTE — Op Note (Signed)
    OPERATIVE NOTE 10/26/2017 3:26 PM  PRE-OPERATIVE DIAGNOSIS:  1) post op vaginal bleeding  POST-OPERATIVE DIAGNOSIS:  Same  OPERATION:  D&E/C  SURGEON(S): Surgeon(s) and Role:    Harlin Heys, MD - Primary   ANESTHESIA: Choice  ESTIMATED BLOOD LOSS: 100 mL's  OPERATIVE FINDINGS: Bleeding from the left side of vaginal cuff  SPECIMEN: * No specimens in log *  COMPLICATIONS: None  DRAINS: Foley to gravity  DISPOSITION: Stable to recovery room  DESCRIPTION OF PROCEDURE:  The patient was in the recovery room and I was called to evaluate her for heavier than normal vaginal bleeding postop.  She indeed had a significant amount of blood loss in the postop area.  We changed beds and put her in stirrups, gave her additional pain medication, and placed a speculum in the vagina to try to identify the area of bleeding.  The area of bleeding could be noted and the suture was placed in this area but did not control the bleeding.  Because of her continued bleeding it was decided to take her back to the operating room to further evaluate this site of bleeding and to control it.  The vagina was packed as the patient was prepared to go to the operating room.      The patient was prepped and draped in the dorsal lithotomy position and placed under general anesthesia.  After removal of the packing a weighted speculum was placed posteriorly and an anterior right angle retractor was placed allowing improved visualization of the vaginal cuff.  The area of the suture previously placed was noted and a small amount of bleeding was noted from this area.  The amount of bleeding was significantly less than previously noted in the recovery area.  3 figure-of-eight sutures were placed in the vaginal cuff near the left angle and progressing toward the midline of the vaginal cuff.  Care was taken to suture only the cuff and avoid deep penetrating sutures laterally.  Hemostasis was noted.  The patient was  taken out of Trendelenburg position and hemostasis was again noted. Patient went to recovery room in stable condition.  All sponge and needle counts were correct.   Finis Bud, M.D. 10/26/2017 3:26 PM

## 2017-10-26 NOTE — Anesthesia Postprocedure Evaluation (Signed)
Anesthesia Post Note  Patient: Jill Rivas  Procedure(s) Performed: HYSTERECTOMY ABDOMINAL WITH BILATERAL SALPINGO OOPHERECTOMY (Bilateral )  Patient location during evaluation: PACU Anesthesia Type: General Level of consciousness: awake and alert and oriented Pain management: pain level controlled Vital Signs Assessment: post-procedure vital signs reviewed and stable Respiratory status: spontaneous breathing, nonlabored ventilation and respiratory function stable Cardiovascular status: blood pressure returned to baseline and stable Postop Assessment: no signs of nausea or vomiting Anesthetic complications: no   Pt taken back to OR due to bleeding from vaginal cuff  Last Vitals:  Vitals:   10/26/17 1402 10/26/17 1417  BP: (!) 142/99 (!) 136/56  Pulse: 70 73  Resp: 11 14  Temp:    SpO2: 98% 99%    Last Pain:  Vitals:   10/26/17 1302  TempSrc:   PainSc: 9                  Arval Brandstetter

## 2017-10-26 NOTE — Anesthesia Preprocedure Evaluation (Signed)
Anesthesia Evaluation  Patient identified by MRN, date of birth, ID band Patient awake    Reviewed: Allergy & Precautions, NPO status , Patient's Chart, lab work & pertinent test results  History of Anesthesia Complications (+) PONV and history of anesthetic complications  Airway Mallampati: I       Dental  (+) Dental Advidsory Given   Pulmonary sleep apnea (none since losing weight) , neg COPD,           Cardiovascular hypertension, Pt. on medications (-) Past MI and (-) CHF + dysrhythmias (tachycardia)      Neuro/Psych neg Seizures Anxiety Depression    GI/Hepatic Neg liver ROS, GERD  Medicated,  Endo/Other  neg diabetes  Renal/GU Renal disease (stones)     Musculoskeletal   Abdominal   Peds  Hematology   Anesthesia Other Findings Patient underwent hysterectomy early and was found to have continued bleeding.  They attempted to repair the bleeding in the PACU, but were unable to do so.  We will proceed emergently to the OR for exam and repair under general anesthesia.  Reproductive/Obstetrics                             Anesthesia Physical  Anesthesia Plan  ASA: II and emergent  Anesthesia Plan: General   Post-op Pain Management:    Induction: Intravenous  PONV Risk Score and Plan: 3 and 4 or greater and Dexamethasone, Ondansetron, Midazolam and Treatment may vary due to age or medical condition  Airway Management Planned: Oral ETT  Additional Equipment:   Intra-op Plan:   Post-operative Plan: Extubation in OR  Informed Consent: I have reviewed the patients History and Physical, chart, labs and discussed the procedure including the risks, benefits and alternatives for the proposed anesthesia with the patient or authorized representative who has indicated his/her understanding and acceptance.     Plan Discussed with: Anesthesiologist, CRNA and Surgeon  Anesthesia Plan  Comments:         Anesthesia Quick Evaluation

## 2017-10-26 NOTE — Transfer of Care (Signed)
Immediate Anesthesia Transfer of Care Note  Patient: Jill Rivas  Procedure(s) Performed: HYSTERECTOMY ABDOMINAL WITH BILATERAL SALPINGO OOPHERECTOMY (Bilateral )  Patient Location: PACU  Anesthesia Type:General  Level of Consciousness: awake  Airway & Oxygen Therapy: Patient Spontanous Breathing and Patient connected to face mask oxygen  Post-op Assessment: Report given to RN and Post -op Vital signs reviewed and stable  Post vital signs: Reviewed and stable  Last Vitals:  Vitals:   10/26/17 0740 10/26/17 1302  BP: 138/83   Pulse: 75   Resp: 17 (P) 12  Temp: (!) 36.4 C (P) 36.6 C  SpO2: 100%     Last Pain:  Vitals:   10/26/17 0740  TempSrc: Tympanic  PainSc: 8          Complications: No apparent anesthesia complications

## 2017-10-26 NOTE — Anesthesia Preprocedure Evaluation (Signed)
Anesthesia Evaluation  Patient identified by MRN, date of birth, ID band Patient awake    Reviewed: Allergy & Precautions, NPO status , Patient's Chart, lab work & pertinent test results  History of Anesthesia Complications (+) PONV  Airway Mallampati: I       Dental   Pulmonary sleep apnea (none since losing weight) , neg COPD,           Cardiovascular hypertension, Pt. on medications (-) Past MI and (-) CHF + dysrhythmias (tachycardia)      Neuro/Psych neg Seizures Anxiety Depression    GI/Hepatic Neg liver ROS, GERD  Medicated,  Endo/Other  neg diabetes  Renal/GU Renal disease (stones)     Musculoskeletal   Abdominal   Peds  Hematology   Anesthesia Other Findings   Reproductive/Obstetrics                            Anesthesia Physical Anesthesia Plan  ASA: II  Anesthesia Plan: General   Post-op Pain Management:    Induction: Intravenous  PONV Risk Score and Plan: 3 and 4 or greater and Dexamethasone, Ondansetron, Midazolam and Treatment may vary due to age or medical condition  Airway Management Planned: Oral ETT  Additional Equipment:   Intra-op Plan:   Post-operative Plan:   Informed Consent: I have reviewed the patients History and Physical, chart, labs and discussed the procedure including the risks, benefits and alternatives for the proposed anesthesia with the patient or authorized representative who has indicated his/her understanding and acceptance.     Plan Discussed with:   Anesthesia Plan Comments:         Anesthesia Quick Evaluation

## 2017-10-26 NOTE — Anesthesia Procedure Notes (Addendum)

## 2017-10-26 NOTE — Op Note (Signed)
     OPERATIVE NOTE 10/26/2017 12:43 PM  PRE-OPERATIVE DIAGNOSIS:  1) DUB, FIBROIDS, DYSMENORRHEA  POST-OPERATIVE DIAGNOSIS:  DUB, FIBROIDS, DYSMENORRHEA  OPERATION: Total Abdominal Hysterectomy with Bilateral Salpingectomy  SURGEON(S): Surgeon(s) and Role:    Harlin Heys, MD - Primary   ASSISTANT:     Cherry  ANESTHESIA: General  ESTIMATED BLOOD LOSS: 180ml  OPERATIVE FINDINGS:   SPECIMEN: * No specimens in log *  COMPLICATIONS: None  DRAINS: Foley to gravity  DISPOSITION: Stable to recovery room  DESCRIPTION OF PROCEDURE: The patient was prepped and draped in the supine position and placed under spinal anesthesia.  A transverse incision was made across the abdomen in a Pfannenstiel manner. We carried the dissection down to the level of the fascia.  The fascia was incised in a curvilinear manner.  The fascia was then elevated from the rectus muscles with blunt and sharp dissection.  The rectus muscles were separated laterally exposing the peritoneum.  The peritoneum was carefully entered with care being taken to avoid bowel and bladder.  A self-retaining retractor was placed. The uterus was grasped and elevated.  The left and right round ligaments were identified and fulgurated.  The anterior portion of the broad ligament was then incised and the bladder was taken down along the cervix mobilizing the bladder from the cervix.  A window was made in the broad ligament and the infundibulopelvic ligaments were carefully identified. The ligaments were clamped divided and doubly suture ligated. Hemostasis of the pedicles was noted. The uterine arteries were skeletonized. The uterine arteries were then clamped the pedicle was divided and suture ligatures were placed. The uterine arteries were double tied with the next pedicle. We proceeded down the lateral aspects of the cervix clamping dividing and suture ligating all pedicles to the level of the external cervical os. The  uterosacral ligaments were clamped divided and suture ligated. A stab incision was made into the vagina and the cervix was cut free of the vaginal mucosa in a circumferential manner using the angled Jergensen scissors. The vaginal cuff was identified on Kocher clamps. Richardson angle sutures were placed in the usual manner and the vaginal cuff was closed with interrupted sutures of Vicryl. Hemostasis was noted. The remainder of the pedicles were carefully inspected and found to be hemostatic. The self-retaining retaining retractor was removed and the sponges were removed from the abdomen and pelvis. Hemostasis was again noted. The fascia was then closed with a running suture of #1 Vicryl.  Hemostasis of the subcutaneous tissues was obtained using the Bovie.  The subcutaneous tissues were closed with a running suture of 000 Vicryl.  A subcuticular suture was placed.  Steri-Strips were applied in the usual manner.  A pressure dressing was placed.  The patient tolerated the procedure well.  All counts were correct.  The patient was taken to the recovery room in stable condition.  Finis Bud, M.D. 10/26/2017 12:43 PM

## 2017-10-26 NOTE — OR Nursing (Signed)
Patient came out to PACU complaining of pain she was moaning and groaning, report given that patient was bleeding pretty heavy from vaginal area, patient had moderate amount of blood on peri pad when checked and some on the pad below.  Patient rechecked in about 15 minutes.  Large baseball size clots noted and bed saturated in blood. Dr. Amalia Hailey notified and told to come see patient.  Patient vital signs are stable and just complaints of pain.  Dr. Amalia Hailey told me and tech what he needed got a OB bed from ED, and supplies from OR.  Lights and some OR staff at bedside trying to stitch patient.  Several attempts made and patient needs to reenter surgery for vaginal cuff repair.  Patient returned to OR at 2:40 p.m. [place] notified of all medications given in PACU during procedure.  All medications documented.  Family to be updated

## 2017-10-26 NOTE — Transfer of Care (Signed)
Immediate Anesthesia Transfer of Care Note  Patient: Jill Rivas  Procedure(s) Performed: Procedure(s): REPAIR VAGINAL CUFF (N/A)  Patient Location: PACU  Anesthesia Type:General  Level of Consciousness: sedated  Airway & Oxygen Therapy: Patient Spontanous Breathing and Patient connected to face mask oxygen  Post-op Assessment: Report given to RN and Post -op Vital signs reviewed and stable  Post vital signs: Reviewed and stable  Last Vitals:  Vitals:   10/26/17 1417 10/26/17 1533  BP: (!) 136/56 127/75  Pulse: 73   Resp: 14 11  Temp:    SpO2: 21% 031%    Complications: No apparent anesthesia complications

## 2017-10-26 NOTE — Anesthesia Post-op Follow-up Note (Signed)
Anesthesia QCDR form completed.        

## 2017-10-26 NOTE — OR Nursing (Signed)
Patient brought back to OR for emergency procedure. 3 raytec sponges  were used in pacu by dr Meliton Rattan were removed once she was asleep in OR and acounted for at end of case.

## 2017-10-26 NOTE — Interval H&P Note (Signed)
History and Physical Interval Note:  10/26/2017 10:24 AM  Jill Rivas  has presented today for surgery, with the diagnosis of DUB, FIBROIDS, DYSMENORRHEA  The various methods of treatment have been discussed with the patient and family. After consideration of risks, benefits and other options for treatment, the patient has consented to  Procedure(s): HYSTERECTOMY ABDOMINAL WITH BILATERAL SALPINGO OOPHERECTOMY (Bilateral) as a surgical intervention .  The patient's history has been reviewed, patient examined, no change in status, stable for surgery.  I have reviewed the patient's chart and labs.  Questions were answered to the patient's satisfaction.     Jeannie Fend

## 2017-10-26 NOTE — Progress Notes (Signed)
Nurse call MD to discuss concerns. Discussed  low urine output. MD stated to monitor for a few more hours and if output didn't increase to call back. Discussed nausea orders given for PRN zofran every 6 hours. Reviewed blood loss and need for CBC tonight or in AM. MD stated he did not want one,  He only will place an order if patient becomes symptomatic.    Hilbert Bible, RN

## 2017-10-26 NOTE — Anesthesia Procedure Notes (Signed)
Procedure Name: Intubation Date/Time: 10/26/2017 11:20 AM Performed by: Allean Found, CRNA Pre-anesthesia Checklist: Patient identified, Emergency Drugs available, Suction available, Patient being monitored and Timeout performed Patient Re-evaluated:Patient Re-evaluated prior to induction Oxygen Delivery Method: Circle system utilized Preoxygenation: Pre-oxygenation with 100% oxygen Induction Type: IV induction Ventilation: Mask ventilation without difficulty Laryngoscope Size: Mac and 3 Grade View: Grade II Tube size: 7.0 mm Number of attempts: 1 Airway Equipment and Method: Stylet Placement Confirmation: ETT inserted through vocal cords under direct vision,  positive ETCO2 and breath sounds checked- equal and bilateral Secured at: 22 cm Tube secured with: Tape Dental Injury: Teeth and Oropharynx as per pre-operative assessment

## 2017-10-27 ENCOUNTER — Encounter: Payer: Self-pay | Admitting: Obstetrics and Gynecology

## 2017-10-27 ENCOUNTER — Other Ambulatory Visit: Payer: Self-pay

## 2017-10-27 MED ORDER — OXYCODONE-ACETAMINOPHEN 5-325 MG PO TABS
1.0000 | ORAL_TABLET | ORAL | Status: DC | PRN
  Administered 2017-10-27 – 2017-10-29 (×9): 2 via ORAL
  Filled 2017-10-27 (×9): qty 2

## 2017-10-27 MED ORDER — PROPRANOLOL HCL ER 60 MG PO CP24
60.0000 mg | ORAL_CAPSULE | Freq: Every day | ORAL | Status: DC
  Administered 2017-10-27 – 2017-10-29 (×3): 60 mg via ORAL
  Filled 2017-10-27 (×4): qty 1

## 2017-10-27 MED ORDER — SODIUM CHLORIDE 0.9 % IJ SOLN
INTRAMUSCULAR | Status: AC
  Administered 2017-10-27: 10 mL
  Filled 2017-10-27: qty 10

## 2017-10-27 MED ORDER — DULOXETINE HCL 30 MG PO CPEP
30.0000 mg | ORAL_CAPSULE | Freq: Every evening | ORAL | Status: AC
  Administered 2017-10-27 – 2017-10-28 (×2): 30 mg via ORAL
  Filled 2017-10-27 (×2): qty 1

## 2017-10-27 MED ORDER — QUETIAPINE FUMARATE 200 MG PO TABS
400.0000 mg | ORAL_TABLET | Freq: Every day | ORAL | Status: DC
  Administered 2017-10-27 – 2017-10-28 (×2): 400 mg via ORAL
  Filled 2017-10-27: qty 1
  Filled 2017-10-27 (×2): qty 2
  Filled 2017-10-27: qty 1

## 2017-10-27 MED ORDER — PROMETHAZINE HCL 25 MG/ML IJ SOLN
25.0000 mg | Freq: Four times a day (QID) | INTRAMUSCULAR | Status: DC | PRN
  Administered 2017-10-27 (×4): 25 mg via INTRAVENOUS
  Filled 2017-10-27 (×5): qty 1

## 2017-10-27 MED ORDER — DULOXETINE HCL 60 MG PO CPEP
60.0000 mg | ORAL_CAPSULE | Freq: Every morning | ORAL | Status: AC
  Administered 2017-10-28 – 2017-10-29 (×2): 60 mg via ORAL
  Filled 2017-10-27 (×3): qty 1

## 2017-10-27 NOTE — Progress Notes (Signed)
Patient ID: Jill Rivas, female   DOB: 07-06-1971, 47 y.o.   MRN: [id number]     POST-OP NOTE - DAY # 1     Subjective:   The patient fells nauseated despite the zofran and has not yet ambulated.   She is taking limited PO fluids. Her pain is well controlled with her current medications. She has a catheter in place.  Objective:  BP 124/70 (BP Location: Right Arm)   Pulse 100   Temp 98.1 F (36.7 C) (Oral)   Resp 18   Ht 5\' 7"  (1.702 m)   Wt 194 lb (88 kg)   LMP 09/30/2017 (Approximate)   SpO2 98%   BMI 30.38 kg/m     Abdomen:                          Abdomen soft and nontender without distention, masses , no wound infection noted.      clean, dry, no drainage   Minimal vaginal bleeding.    Assessment:   Slow progress POD #1    Surgery discussed in detail - return to the OR discussed.  Plan:        Advance diet Encourage ambulation Advance to PO medication Discontinue IV fluids   Finis Bud, M.D. 10/27/2017 1:41 PM

## 2017-10-28 LAB — SURGICAL PATHOLOGY

## 2017-10-28 MED ORDER — PROMETHAZINE HCL 25 MG PO TABS
25.0000 mg | ORAL_TABLET | Freq: Four times a day (QID) | ORAL | Status: DC | PRN
  Administered 2017-10-28: 25 mg via ORAL
  Filled 2017-10-28 (×2): qty 1

## 2017-10-28 NOTE — Anesthesia Postprocedure Evaluation (Signed)
Anesthesia Post Note  Patient: Jill Rivas  Procedure(s) Performed: REPAIR VAGINAL CUFF (N/A Vagina )  Patient location during evaluation: PACU Anesthesia Type: General Level of consciousness: awake and alert Pain management: pain level controlled Vital Signs Assessment: post-procedure vital signs reviewed and stable Respiratory status: spontaneous breathing, nonlabored ventilation, respiratory function stable and patient connected to nasal cannula oxygen Cardiovascular status: blood pressure returned to baseline and stable Postop Assessment: no apparent nausea or vomiting Anesthetic complications: no     Last Vitals:  Vitals:   10/28/17 1722 10/28/17 2021  BP: (!) 80/46 110/64  Pulse: 88 81  Resp: 18   Temp: 37.3 C 36.4 C  SpO2: 99% 100%    Last Pain:  Vitals:   10/28/17 2021  TempSrc: Oral  PainSc:                  [name]

## 2017-10-28 NOTE — Progress Notes (Signed)
Patient ID: Jill Rivas, female   DOB: August 03, 1971, 47 y.o.   MRN: [id number]       POST-OP NOTE - DAY # 2  Subjective:   The patient does not have complaints.  She is ambulating, but she is limited by her neurologic issues of pain in her legs.   She is taking limited PO - still complaining if persistent nausea despite multiple antiemetics.  Her pain is well controlled with her current medications. She is urinating without difficulty and is passing flatus.   Objective:  BP (!) 98/52 (BP Location: Right Arm)   Pulse 85   Temp 98.7 F (37.1 C) (Oral)   Resp 18   Ht 5\' 7"  (1.702 m)   Wt 194 lb (88 kg)   LMP 09/30/2017 (Approximate)   SpO2 98%   BMI 30.38 kg/m     Abdomen:                          Abdomen soft and nontender without distention, masses , no evidence of wound infection noted.      clean, dry, no drainage, healing    Assessment:   Slow progress with PO and ambulation.  Pt progressing slower than expected despite no significant complications post-op.       Plan:       Continue to  Advance diet Encourage ambulation Expect discharge tomorrow.   Finis Bud, M.D. 10/28/2017 4:50 PM

## 2017-10-28 NOTE — Care Management Important Message (Signed)
Important Message  Patient Details  Name: Jill Rivas MRN: [id number] Date of Birth: July 12, 1971   Medicare Important Message Given:  Yes    Shelbie Ammons, RN 10/28/2017, 6:51 AM

## 2017-10-29 MED ORDER — ESTRADIOL 1 MG PO TABS: 1.5000 mg | ORAL_TABLET | Freq: Every day | ORAL | 1 refills | Status: DC

## 2017-10-29 NOTE — Progress Notes (Signed)
Discharge order received from doctor. Reviewed discharge instructions and prescriptions with patient and answered all questions. Follow up appointment given. Incision cleaning kit given. Patient verbalized understanding. Patient discharged home via wheelchair by nursing/auxillary.     Garner, RN  

## 2017-10-29 NOTE — Discharge Instructions (Signed)
Please call your doctor or return to the ER if you experience any chest pains, shortness of breath, dizziness, visual changes, fever greater than 101, any heavy bleeding , large clots,  Or any worsening abdominal pain and cramping that is not controlled by pain medication, . No tampons, enemas, douches, or sexual intercourse for 6 weeks. Also avoid tub baths, hot tubs, or swimming for 6 weeks.   Check your incision daily for any signs of infection such as redness, warmth, swelling, increased pain, or pus/foul smelling drainage    Activity: do not lift over 10 lbs for 6 weeks  No driving for 2 weeks  Pelvic rest for 6 weeks

## 2017-10-29 NOTE — Discharge Summary (Signed)
    Discharge Summary  Admit date: 10/26/2017  Discharge Date and Time:10/29/2017  10:12 AM  Discharge to:  Home  Admission Diagnosis: Dysfunctional uterine bleeding dysmenorrhea uterine fibroids                    Discharge  Diagnoses: Same  OR Procedures:   Procedure(s): TAH BSO Date -------------------  Procedure(s): TAH BSO Date -------------------                              Discharge Day Progress Note:   Subjective:   The patient does not have complaints.  She is ambulating well. She is taking PO well. Her pain is well controlled with her current medications. She is urinating without difficulty and is passing flatus.   Objective:  BP 110/60 (BP Location: Left Arm)   Pulse 81   Temp 98.4 F (36.9 C) (Oral)   Resp 18   Ht 5\' 7"  (1.702 m)   Wt 194 lb (88 kg)   LMP 09/30/2017 (Approximate)   SpO2 99%   BMI 30.38 kg/m     Abdomen:                         clean, dry, no drainage    Assessment:   Doing well.  Patient's progress improved yesterday and she is doing much better today.  She desires discharge.     Plan:        Discharge home.                       Medications as directed.  Hospital Course:  Her immediate postop course was complicated by vaginal bleeding noted in the recovery room immediately after surgery.  She was taken back to the operating room and several interrupted sutures were placed in the vaginal cuff providing excellent hemostasis. Her initial hospital course was complicated by persistent nausea and vomiting over the first day and a half of her postop stay.  Once this resolved she has done quite well.  Condition at Discharge:  good Discharge Medications:     Follow Up:   Follow-up Information    Rubie Maid, MD Follow up in 1 week(s).   Specialties:  Obstetrics and Gynecology, Radiology Contact information: [place] [id number] [phone]           Finis Bud, M.D. 10/29/2017 10:12  AM

## 2017-11-02 ENCOUNTER — Encounter: Payer: Self-pay | Admitting: Obstetrics and Gynecology

## 2017-11-05 ENCOUNTER — Ambulatory Visit (INDEPENDENT_AMBULATORY_CARE_PROVIDER_SITE_OTHER): Payer: Medicare Other | Admitting: Obstetrics and Gynecology

## 2017-11-05 ENCOUNTER — Encounter: Payer: Self-pay | Admitting: Obstetrics and Gynecology

## 2017-11-05 VITALS — BP 125/71 | HR 94 | Wt 197.8 lb

## 2017-11-05 DIAGNOSIS — Z90710 Acquired absence of both cervix and uterus: Secondary | ICD-10-CM

## 2017-11-05 DIAGNOSIS — Z9079 Acquired absence of other genital organ(s): Secondary | ICD-10-CM

## 2017-11-05 DIAGNOSIS — Z90722 Acquired absence of ovaries, bilateral: Secondary | ICD-10-CM

## 2017-11-05 DIAGNOSIS — Z09 Encounter for follow-up examination after completed treatment for conditions other than malignant neoplasm: Secondary | ICD-10-CM

## 2017-11-05 NOTE — Progress Notes (Signed)
Allergy reaction to the tape and has blisters that are leaking fluid, but incision is doing well.

## 2017-11-05 NOTE — Progress Notes (Signed)
    OBSTETRICS/GYNECOLOGY POST-OPERATIVE CLINIC VISIT  Subjective:     Jill Rivas is a 47 y.o. female who presents to the clinic 1 weeks status post total abdominal hysterectomy and bilateral oophorectomy for abnormal uterine bleeding, fibroids and dysmenorrhea. She also had a re-operation on same day of original surgery for heavy vaginal bleeding post-op, with vaginal repair.  Eating a regular diet without difficulty. Bowel movements are normal. Pain is controlled with current analgesics. Medications being used: narcotic analgesics including oxycodone (Oxycontin, Oxyir).  The following portions of the patient's history were reviewed and updated as appropriate: allergies, current medications, past family history, past medical history, past social history, past surgical history and problem list.  Review of Systems Pertinent items noted in HPI and remainder of comprehensive ROS otherwise negative.    Objective:    BP 125/71   Pulse 94   Wt 197 lb 12.8 oz (89.7 kg)   LMP 09/30/2017 (Approximate)   BMI 30.98 kg/m  General:  alert and no distress  Abdomen: soft, bowel sounds active, non-tender  Incision:   healing well, no drainage, no erythema, no hernia, no seroma, no swelling, no dehiscence, incision well approximated    Pathology:   DIAGNOSIS:  A. UTERUS WITH CERVIX AND BILATERAL FALLOPIAN TUBES AND OVARIES; TOTAL  HYSTERECTOMY WITH BILATERAL SALPINGO-OOPHORECTOMY:  - CHRONIC CERVICITIS WITH FOCAL SQUAMOUS METAPLASIA.  - PROLIFERATIVE ENDOMETRIUM.  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  - LEFT OVARY WITH FOLLICULAR CYST, MEASURING 1.0 CM, AND FOCAL NODULAR  HYPERTHECOSIS.  - RIGHT OVARY WITH NO SIGNIFICANT PATHOLOGIC ABNORMALITY.  - BILATERAL FALLOPIAN TUBES WITH BENIGN PARATUBAL CYSTS.  - NEGATIVE FOR ATYPIA AND MALIGNANCY.   Assessment:    Doing well postoperatively.  S/p TAH with BSO and vaginal cuff repair  Plan:   1. Continue  any current medications. 2. Wound care discussed. 3. Operative findings again reviewed. Pathology report discussed. 4. Activity restrictions: no bending, stooping, or squatting, no lifting more than 10 pounds and pelvic rest x 5 weeks 5. Anticipated return to work: not applicable. 6. Follow up: 5 weeks for final post-op check.     Rubie Maid, MD [facility]

## 2017-11-07 ENCOUNTER — Encounter: Payer: Self-pay | Admitting: Emergency Medicine

## 2017-11-07 ENCOUNTER — Inpatient Hospital Stay
Admission: EM | Admit: 2017-11-07 | Discharge: 2017-11-12 | DRG: 907 | Disposition: A | Discharge status: Home / Self Care | Payer: Medicare Other | Attending: Obstetrics and Gynecology | Admitting: Obstetrics and Gynecology

## 2017-11-07 ENCOUNTER — Encounter: Payer: Self-pay | Admitting: Obstetrics and Gynecology

## 2017-11-07 ENCOUNTER — Other Ambulatory Visit: Payer: Self-pay

## 2017-11-07 ENCOUNTER — Inpatient Hospital Stay: Payer: Medicare Other

## 2017-11-07 DIAGNOSIS — N99820 Postprocedural hemorrhage and hematoma of a genitourinary system organ or structure following a genitourinary system procedure: Secondary | ICD-10-CM | POA: Diagnosis not present

## 2017-11-07 DIAGNOSIS — G4730 Sleep apnea, unspecified: Secondary | ICD-10-CM | POA: Diagnosis present

## 2017-11-07 DIAGNOSIS — K660 Peritoneal adhesions (postprocedural) (postinfection): Secondary | ICD-10-CM | POA: Diagnosis present

## 2017-11-07 DIAGNOSIS — R578 Other shock: Secondary | ICD-10-CM | POA: Diagnosis present

## 2017-11-07 DIAGNOSIS — Z79899 Other long term (current) drug therapy: Secondary | ICD-10-CM

## 2017-11-07 DIAGNOSIS — N939 Abnormal uterine and vaginal bleeding, unspecified: Secondary | ICD-10-CM | POA: Diagnosis present

## 2017-11-07 DIAGNOSIS — G9340 Encephalopathy, unspecified: Secondary | ICD-10-CM

## 2017-11-07 DIAGNOSIS — G90529 Complex regional pain syndrome I of unspecified lower limb: Secondary | ICD-10-CM | POA: Diagnosis present

## 2017-11-07 DIAGNOSIS — D62 Acute posthemorrhagic anemia: Secondary | ICD-10-CM | POA: Diagnosis present

## 2017-11-07 DIAGNOSIS — Z87442 Personal history of urinary calculi: Secondary | ICD-10-CM | POA: Diagnosis not present

## 2017-11-07 DIAGNOSIS — Z90710 Acquired absence of both cervix and uterus: Secondary | ICD-10-CM

## 2017-11-07 DIAGNOSIS — Z90722 Acquired absence of ovaries, bilateral: Secondary | ICD-10-CM

## 2017-11-07 DIAGNOSIS — Z9049 Acquired absence of other specified parts of digestive tract: Secondary | ICD-10-CM | POA: Diagnosis not present

## 2017-11-07 DIAGNOSIS — R52 Pain, unspecified: Secondary | ICD-10-CM | POA: Diagnosis not present

## 2017-11-07 DIAGNOSIS — E876 Hypokalemia: Secondary | ICD-10-CM | POA: Diagnosis not present

## 2017-11-07 DIAGNOSIS — E861 Hypovolemia: Secondary | ICD-10-CM | POA: Diagnosis present

## 2017-11-07 DIAGNOSIS — F4310 Post-traumatic stress disorder, unspecified: Secondary | ICD-10-CM | POA: Diagnosis present

## 2017-11-07 DIAGNOSIS — G92 Toxic encephalopathy: Secondary | ICD-10-CM | POA: Diagnosis present

## 2017-11-07 DIAGNOSIS — G894 Chronic pain syndrome: Secondary | ICD-10-CM | POA: Diagnosis not present

## 2017-11-07 DIAGNOSIS — F329 Major depressive disorder, single episode, unspecified: Secondary | ICD-10-CM | POA: Diagnosis present

## 2017-11-07 DIAGNOSIS — Y838 Other surgical procedures as the cause of abnormal reaction of the patient, or of later complication, without mention of misadventure at the time of the procedure: Secondary | ICD-10-CM | POA: Diagnosis present

## 2017-11-07 DIAGNOSIS — E860 Dehydration: Secondary | ICD-10-CM | POA: Diagnosis present

## 2017-11-07 DIAGNOSIS — Z9889 Other specified postprocedural states: Secondary | ICD-10-CM | POA: Diagnosis not present

## 2017-11-07 DIAGNOSIS — F419 Anxiety disorder, unspecified: Secondary | ICD-10-CM | POA: Diagnosis present

## 2017-11-07 DIAGNOSIS — K219 Gastro-esophageal reflux disease without esophagitis: Secondary | ICD-10-CM | POA: Diagnosis present

## 2017-11-07 DIAGNOSIS — I9581 Postprocedural hypotension: Secondary | ICD-10-CM | POA: Diagnosis not present

## 2017-11-07 DIAGNOSIS — I10 Essential (primary) hypertension: Secondary | ICD-10-CM | POA: Diagnosis present

## 2017-11-07 DIAGNOSIS — R112 Nausea with vomiting, unspecified: Secondary | ICD-10-CM | POA: Diagnosis not present

## 2017-11-07 LAB — PROTIME-INR
INR: 1.03
Prothrombin Time: 13.4 seconds (ref 11.4–15.2)

## 2017-11-07 LAB — CBC
HCT: 26 % — ABNORMAL LOW (ref 35.0–47.0)
HEMOGLOBIN: 8.6 g/dL — AB (ref 12.0–16.0)
MCH: 28.7 pg (ref 26.0–34.0)
MCHC: 32.9 g/dL (ref 32.0–36.0)
MCV: 87.2 fL (ref 80.0–100.0)
PLATELETS: 447 10*3/uL — AB (ref 150–440)
RBC: 2.98 MIL/uL — AB (ref 3.80–5.20)
RDW: 13.7 % (ref 11.5–14.5)
WBC: 13.4 10*3/uL — AB (ref 3.6–11.0)

## 2017-11-07 LAB — COMPREHENSIVE METABOLIC PANEL
ALT: 13 U/L — ABNORMAL LOW (ref 14–54)
AST: 23 U/L (ref 15–41)
Albumin: 4 g/dL (ref 3.5–5.0)
Alkaline Phosphatase: 89 U/L (ref 38–126)
Anion gap: 9 (ref 5–15)
BUN: 8 mg/dL (ref 6–20)
CO2: 23 mmol/L (ref 22–32)
Calcium: 8.8 mg/dL — ABNORMAL LOW (ref 8.9–10.3)
Chloride: 105 mmol/L (ref 101–111)
Creatinine, Ser: 0.88 mg/dL (ref 0.44–1.00)
GFR calc Af Amer: >60 mL/min (ref >60)
GFR calc non Af Amer: >60 mL/min (ref >60)
Glucose, Bld: 90 mg/dL (ref 65–99)
Potassium: 3.7 mmol/L (ref 3.5–5.1)
Sodium: 137 mmol/L (ref 135–145)
Total Bilirubin: 0.7 mg/dL (ref 0.3–1.2)
Total Protein: 7.8 g/dL (ref 6.5–8.1)

## 2017-11-07 LAB — APTT: aPTT: 33 seconds (ref 24–36)

## 2017-11-07 MED ORDER — DEXTROSE IN LACTATED RINGERS 5 % IV SOLN
INTRAVENOUS | Status: DC
  Administered 2017-11-07 – 2017-11-09 (×4): via INTRAVENOUS

## 2017-11-07 MED ORDER — HYDROMORPHONE HCL 1 MG/ML IJ SOLN
1.0000 mg | INTRAMUSCULAR | Status: AC | PRN
  Administered 2017-11-07 – 2017-11-08 (×2): 1 mg via INTRAVENOUS
  Filled 2017-11-07 (×2): qty 1

## 2017-11-07 NOTE — ED Notes (Signed)
Alerted MBA that patient was in [place] and would be up after that

## 2017-11-07 NOTE — ED Notes (Signed)
Called to give report

## 2017-11-07 NOTE — ED Notes (Signed)
Super size tampon was inserted.  Pt was given [place] sandwich and chips and banana as well as ginger ale as she is allowed a regular diet.

## 2017-11-07 NOTE — ED Notes (Signed)
Pt called out letting me know that she had another episode of bleeding.  She had a gush of bleeding, nursing student changed pad and when I checked there appeared to be no further bleeding.

## 2017-11-07 NOTE — ED Triage Notes (Signed)
Pt to ed with c/o hyst. On 2/4.  Pt states no abnormal bleeding since until last night, started with heavy bleeding and now needs diaper due to heavy bleeding.  Pt reports mild right lower quad abd pain.

## 2017-11-07 NOTE — H&P (Signed)
Arron Mcnaught, Alanda Slim, MD  Physician  Gynecology  Progress Notes  Signed  Date of Service:  11/07/2017 5:32 PM          Signed            [] Hide copied text  [] Hover for details   Patient ID: Jill Rivas, female   DOB: 11-27-1970, 47 y.o.   MRN: [id number] GYN ENCOUNTER NOTE  ER Bartow is a 47 y.o. G0P0000 female is here for gynecologic evaluation of the following issues:  1.  Vaginal bleeding.   2.  Status post TAH/BSO on 10/26/2017  Patient presents to the emergency room with 24-hour history of new onset bright red vaginal bleeding.  No clots were noted.  The patient was soaking through pads and depends.  She felt slightly lightheaded and weak and presents for further follow-up.  Review of patient's recent history is notable for TAH/BSO being performed on 10/26/2017.  Postoperatively, the patient was having bright red vaginal bleeding and additional sutures were placed within the vagina in the PACU.  This controlled the postoperative bleeding at that time.  Patient had no further bleeding postoperatively until the past 24 hours.  The patient was seen in the GYN clinic on 11/05/2017 and was doing well at that time.  The patient denies fevers chills or sweats.  She denies nausea vomiting or diarrhea.  She denies abdominal pelvic pain.   ; Good vault support; Obstetric History OB History  Gravida Para Term Preterm AB Living  0 0 0 0 0 0  SAB TAB Ectopic Multiple Live Births  0 0 0 0 0            Past Medical History:  Diagnosis Date  . Anxiety   . Bilateral occipital neuralgia   . Compression fracture of cervical vertebra ([place]) 2018  . CRPS (complex regional pain syndrome type I) 2019  . CRPS (complex regional pain syndrome)   . DDD (degenerative disc disease), thoracolumbar 2019  . Depression   . Dysrhythmia    tacchycardia and bradycardia  . GERD (gastroesophageal reflux disease)     . History of kidney stones   . Hypertension 2019   blood pressure elevated due to anxiety  . Kidney stones   . Migraines 9562   complicated migraines can go into seizure like activity.  inderal and sleep help.  Marland Kitchen PONV (postoperative nausea and vomiting)    some nausea after surgery  . PTSD (post-traumatic stress disorder)   . Reflex sympathetic dystrophy    left foot  . Reflex sympathetic dystrophy of lower limb    foot  . Sleep apnea 2017   tested again approx 1 month ago and sleep apnea has resolved.         Past Surgical History:  Procedure Laterality Date  . ABDOMINAL HYSTERECTOMY    . AUGMENTATION MAMMAPLASTY Bilateral 2005   SALINE  . BREAST ENHANCEMENT SURGERY Bilateral   . BREAST SURGERY    . CHOLECYSTECTOMY    . FOOT SURGERY Left 1993   to free the nerve in ankle  . 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 REPAIR VAGINAL CUFF N/A 10/26/2017   Procedure: REPAIR VAGINAL CUFF;  Surgeon: Harlin Heys, MD;  Location: [facility] ORS;  Service: Gynecology;  Laterality: N/A;    No current facility-administered medications on file prior to encounter.          Current Outpatient Medications on File Prior to Encounter  Medication Sig Dispense Refill  . cetirizine (ZYRTEC) 10 MG tablet Take 10 mg by mouth at bedtime.   5  . clonazePAM (KLONOPIN) 0.5 MG tablet Take 0.5 mg by mouth 2 (two) times daily.    . diclofenac sodium (VOLTAREN) 1 % GEL Apply 2-4 g to painful area of skin 4 times per day if tolerated. Patient with history of strain, sprain, and muscle spasms of extremities 500 g 0  . DULoxetine (CYMBALTA) 30 MG capsule Take 30-60 mg by mouth See admin instructions. Take 60 mg by mouth in the morning and 30 mg by mouth at bedtime.  0  . estradiol (ESTRACE) 1 MG tablet Take 1.5 tablets (1.5 mg total) by mouth daily. 45 tablet 1  . ibuprofen (ADVIL,MOTRIN) 200 MG tablet Take 800 mg  by mouth daily as needed for moderate pain.    Marland Kitchen omeprazole (PRILOSEC) 40 MG capsule TAKE 1 TABLET BY MOUTH DAILY AT BEDTIME FOR REFLUX  5  . Oxycodone HCl 10 MG TABS Limit 4-6 tablets po daily if tolerated (Patient taking differently: Take 10 mg by mouth every 4 (four) hours. ) 180 tablet 0  . pravastatin (PRAVACHOL) 20 MG tablet Take 20 mg by mouth at bedtime.    . prazosin (MINIPRESS) 1 MG capsule Take 1-2 mg by mouth See admin instructions. Take 1 mg by mouth twice daily and 2 mg at bedtime    . propranolol ER (INDERAL LA) 60 MG 24 hr capsule Take 60 mg by mouth daily.    . QUEtiapine (SEROQUEL) 400 MG tablet Take 400 mg by mouth at bedtime.  2  . tiZANidine (ZANAFLEX) 4 MG tablet Take 6-8 mg by mouth See admin instructions. Take 6 mg by mouth twice daily and 8 mg at bedtime  2  . topiramate (TOPAMAX) 50 MG tablet Limit 1-2 tabs by mouth twice a day if tolerated (Patient taking differently: Take 100 mg by mouth 2 (two) times daily. ) 120 tablet 2  . triamcinolone (KENALOG) 0.025 % cream APPLY TO AFFECTED AREA TWICE DAILY AS NEEDED FOR ECZEMA  2  . vitamin B-12 (CYANOCOBALAMIN) 1000 MCG tablet Take 1,000 mcg by mouth daily as needed (low b12).    . zolpidem (AMBIEN) 10 MG tablet Take 10 mg by mouth once daily at bedtime  5    Allergies  Allergen Reactions  . Goat-Derived Products Swelling    Swelling and severe itching if near live goats  . Other Swelling    COCKROACHES cause throat and lip swelling.  . Adhesive [Tape] Rash    Paper tape causes a rash as well    Social History        Socioeconomic History  . Marital status: Married    Spouse name: Not on file  . Number of children: Not on file  . Years of education: Not on file  . Highest education level: Not on file  Social Needs  . Financial resource strain: Not on file  . Food insecurity - worry: Not on file  . Food insecurity - inability: Not on file  . Transportation needs - medical: Not on  file  . Transportation needs - non-medical: Not on file  Occupational History  . Not on file  Tobacco Use  . Smoking status: Never Smoker  . Smokeless  tobacco: Never Used  Substance and Sexual Activity  . Alcohol use: No    Alcohol/week: 0.0 oz  . Drug use: No  . Sexual activity: No    Birth control/protection: Surgical  Other Topics Concern  . Not on file  Social History Narrative  . Not on file         Family History  Problem Relation Age of Onset  . Cancer Mother   . Breast cancer Mother     The following portions of the patient's history were reviewed and updated as appropriate: allergies, current medications, past family history, past medical history, past social history, past surgical history and problem list.  Review of Systems Review of Systems  Constitutional: Positive for malaise/fatigue. Negative for chills, diaphoresis and fever.  HENT: Negative.   Respiratory: Negative for shortness of breath.   Cardiovascular: Negative.  Negative for chest pain.  Gastrointestinal: Negative for abdominal pain, constipation, diarrhea, nausea and vomiting.  Genitourinary: Negative.        Vaginal bleeding times 24 hours, bright red without clots  Musculoskeletal: Negative.   Skin: Negative.   Neurological: Positive for weakness.  Endo/Heme/Allergies: Negative.   Psychiatric/Behavioral: Negative.    Objective:   BP 126/61 (BP Location: Right Arm)   Pulse 87   Temp 98.8 F (37.1 C) (Oral)   Resp 18   Ht 5\' 7"  (1.702 m)   Wt 200 lb (90.7 kg)   LMP 09/30/2017 (Approximate)   SpO2 95%   BMI 31.32 kg/m  CONSTITUTIONAL: Well-developed, well-nourished female in no acute distress.  HENT:  Normocephalic, atraumatic.  NECK: Normal range of motion, supple, no masses.  Normal thyroid.  SKIN: Skin is warm and dry. No rash noted. Not diaphoretic. No erythema. No pallor. Early: Alert and oriented to person, place, and time. PSYCHIATRIC: Normal mood and affect.  Normal behavior. Normal judgment and thought content. CARDIOVASCULAR:Not Examined RESPIRATORY: Not Examined BREASTS: Not Examined ABDOMEN: Soft, non distended; Non tender.  No Organomegaly. PELVIC:             External Genitalia: Normal             BUS: Normal             Vagina: Normal vaginal cuff is intact; suture line at the vaginal apex is noted to be intact.  Bimanual exam reveals an intact cuff and no palpable masses or significant tenderness.             Cervix: Surgically absent             Uterus: Surgically absent             Adnexa: Normal; nonpalpable and nontender             RV: Normal external exam             Bladder: Nontender MUSCULOSKELETAL: Normal range of motion. No tenderness.  No cyanosis, clubbing, or edema.     Assessment:   1.  12 days status post TAH/BSO 2.  Episode of vaginal bleeding postoperatively with vaginal cuff sutures placed in the PACU on day of surgery 3.  24 hours of vaginal bleeding, now resolved, without obvious source of bleed  Plan:   1.  Reassurance is given to the patient 2.  CBC is ordered and pending 3.  Patient is to place 1 or 2 tampons intravaginal should bright red bleeding recur.  If she soaks through 1 or more tampons, she is to return to the emergency  room 4.  Patient is to return in 72 hours to the clinic for further follow-up with me.  A total of 25 minutes were spent face-to-face with the patient during this encounter and over half of that time involved counseling and coordination of care.  Brayton Mars, MD  Note: This dictation was prepared with Dragon dictation along with smaller phrase technology. Any transcriptional errors that result from this process are unintentional.             Electronically signed by Madelyn Tlatelpa, Alanda Slim, MD at 11/07/2017 5:40 PM     ED on 11/07/2017        Detailed Report    ADDENDUM: Pt had recurrent vaginal bleeding in ER. Hemoglobin  8.6  Plan: Admit for observation. Pelvic U/S IV Access and Type and Screen Recheck CBC in AM Tampon use ok for vaginal use. Brayton Mars, MD

## 2017-11-07 NOTE — ED Provider Notes (Signed)
[facility] Emergency Department Provider Note   ____________________________________________   First MD Initiated Contact with Patient 11/07/17 1553     (approximate)  I have reviewed the triage vital signs and the nursing notes.   HISTORY  Chief Complaint Post-op Problem    HPI Jill Rivas is a 47 y.o. female hysterectomy about 1 week ago  Patient had a hysterectomy, initially procedure went well but had to have additional sutures placed for bleeding postoperatively she reports that she left the hospital felt well went to her follow-up on Thursday without issue.  Yesterday she began to notice some vaginal spotting, then today began to have increasing bleeding going through 1 pad an hour for the first couple of hours and then having to switch over to using adult depends due to increasing bleeding.  Bleeding has tapered off somewhat at this point, but does have some blood in her depends at this time.  Denies pain no fevers chills nausea or vomiting.   Past Medical History:  Diagnosis Date  . Anxiety   . Bilateral occipital neuralgia   . Compression fracture of cervical vertebra ([place]) 2018  . CRPS (complex regional pain syndrome type I) 2019  . CRPS (complex regional pain syndrome)   . DDD (degenerative disc disease), thoracolumbar 2019  . Depression   . Dysrhythmia    tacchycardia and bradycardia  . GERD (gastroesophageal reflux disease)   . History of kidney stones   . Hypertension 2019   blood pressure elevated due to anxiety  . Kidney stones   . Migraines 0086   complicated migraines can go into seizure like activity.  inderal and sleep help.  Marland Kitchen PONV (postoperative nausea and vomiting)    some nausea after surgery  . PTSD (post-traumatic stress disorder)   . Reflex sympathetic dystrophy    left foot  . Reflex sympathetic dystrophy of lower limb    foot  . Sleep apnea 2017   tested again approx 1 month ago and sleep apnea has  resolved.    Patient Active Problem List   Diagnosis Date Noted  . Post-operative state 10/26/2017  . Moderate episode of recurrent major depressive disorder ([place])   . Chest pain 07/10/2017  . Bilateral occipital neuralgia 01/10/2016  . Cervical disc disorder with radiculopathy of cervical region 02/27/2015  . 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 DDD (degenerative disc disease), cervical with radiculopathy 01/23/2015  . CRPS 1 (complex regional pain syndrome I) of lower limb 01/23/2015  . DDD (degenerative disc disease), lumbosacral 01/23/2015    Past Surgical History:  Procedure Laterality Date  . ABDOMINAL HYSTERECTOMY    . AUGMENTATION MAMMAPLASTY Bilateral 2005   SALINE  . BREAST ENHANCEMENT SURGERY Bilateral   . BREAST SURGERY    . CHOLECYSTECTOMY    . FOOT SURGERY Left 1993   to free the nerve in ankle  . 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 REPAIR VAGINAL CUFF N/A 10/26/2017   Procedure: REPAIR VAGINAL CUFF;  Surgeon: Harlin Heys, MD;  Location: [facility] ORS;  Service: Gynecology;  Laterality: N/A;    Prior to Admission medications   Medication Sig Start Date End Date Taking? Authorizing Provider  cetirizine (ZYRTEC) 10 MG tablet Take 10 mg by mouth at bedtime.  09/28/17   [provider]  clonazePAM (KLONOPIN) 0.5 MG tablet Take 0.5 mg by mouth 2 (two) times daily.    [provider]  diclofenac sodium (VOLTAREN) 1 % GEL Apply 2-4 g to painful area of skin 4 times per day if tolerated. Patient with history of strain, sprain, and muscle spasms of extremities 05/15/16   Mohammed Kindle, MD  DULoxetine (CYMBALTA) 30 MG capsule Take 30-60 mg by mouth See admin instructions. Take 60 mg by mouth in the morning and 30 mg by mouth at bedtime. 06/23/17   [provider]  estradiol (ESTRACE) 1 MG tablet Take 1.5 tablets (1.5 mg total) by mouth daily. 10/29/17   Harlin Heys, MD    ibuprofen (ADVIL,MOTRIN) 200 MG tablet Take 800 mg by mouth daily as needed for moderate pain.    [provider]  omeprazole (PRILOSEC) 40 MG capsule TAKE 1 TABLET BY MOUTH DAILY AT BEDTIME FOR REFLUX 02/08/17   [provider]  Oxycodone HCl 10 MG TABS Limit 4-6 tablets po daily if tolerated Patient taking differently: Take 10 mg by mouth every 4 (four) hours.  05/15/16   Mohammed Kindle, MD  pravastatin (PRAVACHOL) 20 MG tablet Take 20 mg by mouth at bedtime.    [provider]  prazosin (MINIPRESS) 1 MG capsule Take 1-2 mg by mouth See admin instructions. Take 1 mg by mouth twice daily and 2 mg at bedtime    [provider]  propranolol ER (INDERAL LA) 60 MG 24 hr capsule Take 60 mg by mouth daily.    [provider]  QUEtiapine (SEROQUEL) 400 MG tablet Take 400 mg by mouth at bedtime. 01/22/17   [provider]  tiZANidine (ZANAFLEX) 4 MG tablet Take 6-8 mg by mouth See admin instructions. Take 6 mg by mouth twice daily and 8 mg at bedtime 06/24/17   [provider]  topiramate (TOPAMAX) 50 MG tablet Limit 1-2 tabs by mouth twice a day if tolerated Patient taking differently: Take 100 mg by mouth 2 (two) times daily.  04/15/16   Mohammed Kindle, MD  triamcinolone (KENALOG) 0.025 % cream APPLY TO AFFECTED AREA TWICE DAILY AS NEEDED FOR ECZEMA 06/22/17   [provider]  vitamin B-12 (CYANOCOBALAMIN) 1000 MCG tablet Take 1,000 mcg by mouth daily as needed (low b12).    [provider]  zolpidem (AMBIEN) 10 MG tablet Take 10 mg by mouth once daily at bedtime 08/26/17   [provider]    Allergies Goat-derived products; Other; and Adhesive [tape]  Family History  Problem Relation Age of Onset  . Cancer Mother   . Breast cancer Mother     Social History Social History   Tobacco Use  . Smoking status: Never Smoker  . Smokeless tobacco: Never Used  Substance Use Topics  . Alcohol use: No    Alcohol/week:  0.0 oz  . Drug use: No    Review of Systems Constitutional: No fever/chills Eyes: No visual changes. ENT: No sore throat. Cardiovascular: Denies chest pain. Respiratory: Denies shortness of breath. Gastrointestinal: No abdominal pain.  No nausea, no vomiting.  Genitourinary: Negative for dysuria.  See HPI Musculoskeletal: Negative for back pain. Skin: Negative for rash. Neurological: Negative for headaches, focal weakness or numbness.    ____________________________________________   PHYSICAL EXAM:  VITAL SIGNS: ED Triage Vitals  Enc Vitals Group     BP 11/07/17 1543 126/61     Pulse Rate 11/07/17 1543 87     Resp 11/07/17 1543 18     Temp 11/07/17 1543 98.8 F (37.1 C)     Temp Source 11/07/17 1543 Oral  SpO2 11/07/17 1543 95 %     Weight 11/07/17 1543 200 lb (90.7 kg)     Height 11/07/17 1543 5\' 7"  (1.702 m)     Head Circumference --      Peak Flow --      Pain Score 11/07/17 1549 4     Pain Loc --      Pain Edu? --      Excl. in [place]? --     Constitutional: Alert and oriented. Well appearing and in no acute distress.  Ambulating in room without distress on injury. Eyes: Conjunctivae are normal. Head: Atraumatic. Nose: No congestion/rhinnorhea. Mouth/Throat: Mucous membranes are moist. Neck: No stridor.   Cardiovascular: Normal rate, regular rhythm. Grossly normal heart sounds.  Good peripheral circulation. Respiratory: Normal respiratory effort.  No retractions. Lungs CTAB. Gastrointestinal: Soft and nontender. No distention. GYN deferred to [facility] doctor Musculoskeletal: No lower extremity tenderness nor edema. Neurologic:  Normal speech and language. No gross focal neurologic deficits are appreciated.  Skin:  Skin is warm, dry and intact. No rash noted. Psychiatric: Mood and affect are normal. Speech and behavior are normal.  ____________________________________________   LABS (all labs ordered are listed, but only abnormal results are displayed)  Labs  Reviewed  COMPREHENSIVE METABOLIC PANEL - Abnormal; Notable for the following components:      Result Value   Calcium 8.8 (*)    ALT 13 (*)    All other components within normal limits  CBC - Abnormal; Notable for the following components:   WBC 13.4 (*)    RBC 2.98 (*)    Hemoglobin 8.6 (*)    HCT 26.0 (*)    Platelets 447 (*)    All other components within normal limits  PROTIME-INR  APTT  TYPE AND SCREEN  TYPE AND SCREEN   ____________________________________________  EKG   ____________________________________________  RADIOLOGY   ____________________________________________   PROCEDURES  Procedure(s) performed: None  Procedures  Critical Care performed: No  ____________________________________________   INITIAL IMPRESSION / ASSESSMENT AND PLAN / ED COURSE  Pertinent labs & imaging results that were available during my care of the patient were reviewed by me and considered in my medical decision making (see chart for details).  Patient presents for evaluation of vaginal bleeding.  Notably red, going through several pads earlier seems to be tapering off now.  Very reassuring abdominal exam, no pain.  No anticoagulants.  She does report notable bleeding and had postoperative bleeding which had to be corrected.  Stable hemodynamics and no distress.  Based on the history, source and location of bleeding and I have asked OB/GYN to consult to provide further care and treatment for this patient as it appears likely a GYN source.  Denies black or bloody stool.  Clinical Course as of Nov 07 1836  Sat Nov 07, 2017  1606 Dr. Orlene Plum [facility] will be here in about 30 minutes to provide evaluation and consult. Spoke with him on phone at this time.   [MQ]    Clinical Course User Index [MQ] Delman Kitten, MD   ----------------------------------------- 5:46 PM on 11/07/2017 -----------------------------------------  Awaiting CBC which that hemolyzed, patient hemogram  weekly stable.  Is seen and examined by Dr. Quenten Raven he reports that bleeding has stopped, no evidence of any ongoing bleeding.  OB/GYN recommends discharged home if hemoglobin stable   ----------------------------------------- 6:38 PM on 11/07/2017 ----------------------------------------- Patient hemoglobin returned at 8.6 is a notable drop of almost 6 points from last check.  Also patient reports  that she is began having some slight to moderate amount of ongoing bright red vaginal bleeding.  Called and updated Dr. Quenten Raven, he advises that he will admit the patient to the hospital at this time.  Currently calling in admission orders.  Updated patient, she is awake alert, hemodynamically stable.  At this point does not appear to meet criteria for emergent blood transfusion, but will need close monitoring with Dr. Quenten Raven and GYN service will provide.  Dr. Quenten Raven aware of hemoglobin drop and also recurrence of bleeding at this time.  ____________________________________________   FINAL CLINICAL IMPRESSION(S) / ED DIAGNOSES  Final diagnoses:  Vaginal bleeding  Acute blood loss anemia      NEW MEDICATIONS STARTED DURING THIS VISIT:  Current Discharge Medication List       Note:  This document was prepared using Dragon voice recognition software and may include unintentional dictation errors.     Delman Kitten, MD 11/07/17 2140

## 2017-11-07 NOTE — ED Notes (Addendum)
Patient transported to [facility] 

## 2017-11-07 NOTE — ED Notes (Signed)
Date and time results received: 11/07/17 6:35 PM    Test: Hemoglobin  Critical Value: 8.6  Name of Provider Notified: Quale  Orders Received? EDP checked on pt and notified GYN

## 2017-11-07 NOTE — Anesthesia Preprocedure Evaluation (Deleted)
Anesthesia Evaluation  Patient identified by MRN, date of birth, ID band Patient awake    Reviewed: Allergy & Precautions, NPO status , Patient's Chart, lab work & pertinent test results  History of Anesthesia Complications (+) PONV and history of anesthetic complications  Airway Mallampati: III  TM Distance: <3 FB     Dental  (+) Teeth Intact   Pulmonary sleep apnea (none since losing weight) , neg COPD,    Pulmonary exam normal        Cardiovascular hypertension, Pt. on medications (-) Past MI and (-) CHF Normal cardiovascular exam+ dysrhythmias (tachycardia)      Neuro/Psych neg Seizures Anxiety Depression    GI/Hepatic Neg liver ROS, GERD  Medicated,  Endo/Other  neg diabetes  Renal/GU Renal disease (stones)     Musculoskeletal   Abdominal Normal abdominal exam  (+)   Peds  Hematology   Anesthesia Other Findings   Reproductive/Obstetrics                            Anesthesia Physical  Anesthesia Plan  ASA: II  Anesthesia Plan: General   Post-op Pain Management:    Induction: Intravenous  PONV Risk Score and Plan: 3 and 4 or greater and Dexamethasone, Ondansetron, Midazolam and Treatment may vary due to age or medical condition  Airway Management Planned: Oral ETT  Additional Equipment:   Intra-op Plan:   Post-operative Plan: Extubation in OR  Informed Consent: I have reviewed the patients History and Physical, chart, labs and discussed the procedure including the risks, benefits and alternatives for the proposed anesthesia with the patient or authorized representative who has indicated his/her understanding and acceptance.     Plan Discussed with: CRNA and Surgeon  Anesthesia Plan Comments:                                          Anesthesia Evaluation  Patient identified by MRN, date of birth, ID band Patient awake    Reviewed: Allergy & Precautions, NPO  status , Patient's Chart, lab work & pertinent test results  History of Anesthesia Complications (+) PONV and history of anesthetic complications  Airway Mallampati: I       Dental  (+) Dental Advidsory Given   Pulmonary sleep apnea (none since losing weight) , neg COPD,           Cardiovascular hypertension, Pt. on medications (-) Past MI and (-) CHF + dysrhythmias (tachycardia)      Neuro/Psych neg Seizures Anxiety Depression    GI/Hepatic Neg liver ROS, GERD  Medicated,  Endo/Other  neg diabetes  Renal/GU Renal disease (stones)     Musculoskeletal   Abdominal   Peds  Hematology   Anesthesia Other Findings Patient underwent hysterectomy early and was found to have continued bleeding.  They attempted to repair the bleeding in the PACU, but were unable to do so.  We will proceed emergently to the OR for exam and repair under general anesthesia.  Reproductive/Obstetrics                             Anesthesia Physical  Anesthesia Plan  ASA: II and emergent  Anesthesia Plan: General   Post-op Pain Management:    Induction: Intravenous  PONV Risk Score and Plan: 3 and 4 or greater  and Dexamethasone, Ondansetron, Midazolam and Treatment may vary due to age or medical condition  Airway Management Planned: Oral ETT  Additional Equipment:   Intra-op Plan:   Post-operative Plan: Extubation in OR  Informed Consent: I have reviewed the patients History and Physical, chart, labs and discussed the procedure including the risks, benefits and alternatives for the proposed anesthesia with the patient or authorized representative who has indicated his/her understanding and acceptance.     Plan Discussed with: Anesthesiologist, CRNA and Surgeon  Anesthesia Plan Comments:         Anesthesia Quick Evaluation  Anesthesia Quick Evaluation

## 2017-11-07 NOTE — Progress Notes (Signed)
Patient ID: Jill Rivas, female   DOB: 02/04/1971, 47 y.o.   MRN: [id number] GYN ENCOUNTER NOTE  ER Meeker is a 47 y.o. G0P0000 female is here for gynecologic evaluation of the following issues:  1.  Vaginal bleeding.   2.  Status post TAH/BSO on 10/26/2017  Patient presents to the emergency room with 24-hour history of new onset bright red vaginal bleeding.  No clots were noted.  The patient was soaking through pads and depends.  She felt slightly lightheaded and weak and presents for further follow-up.  Review of patient's recent history is notable for TAH/BSO being performed on 10/26/2017.  Postoperatively, the patient was having bright red vaginal bleeding and additional sutures were placed within the vagina in the PACU.  This controlled the postoperative bleeding at that time.  Patient had no further bleeding postoperatively until the past 24 hours.  The patient was seen in the GYN clinic on 11/05/2017 and was doing well at that time.  The patient denies fevers chills or sweats.  She denies nausea vomiting or diarrhea.  She denies abdominal pelvic pain.   ; Good vault support; Obstetric History OB History  Gravida Para Term Preterm AB Living  0 0 0 0 0 0  SAB TAB Ectopic Multiple Live Births  0 0 0 0 0        Past Medical History:  Diagnosis Date  . Anxiety   . Bilateral occipital neuralgia   . Compression fracture of cervical vertebra ([place]) 2018  . CRPS (complex regional pain syndrome type I) 2019  . CRPS (complex regional pain syndrome)   . DDD (degenerative disc disease), thoracolumbar 2019  . Depression   . Dysrhythmia    tacchycardia and bradycardia  . GERD (gastroesophageal reflux disease)   . History of kidney stones   . Hypertension 2019   blood pressure elevated due to anxiety  . Kidney stones   . Migraines 8527   complicated migraines can go into seizure like activity.  inderal and sleep help.  Marland Kitchen PONV (postoperative  nausea and vomiting)    some nausea after surgery  . PTSD (post-traumatic stress disorder)   . Reflex sympathetic dystrophy    left foot  . Reflex sympathetic dystrophy of lower limb    foot  . Sleep apnea 2017   tested again approx 1 month ago and sleep apnea has resolved.    Past Surgical History:  Procedure Laterality Date  . ABDOMINAL HYSTERECTOMY    . AUGMENTATION MAMMAPLASTY Bilateral 2005   SALINE  . BREAST ENHANCEMENT SURGERY Bilateral   . BREAST SURGERY    . CHOLECYSTECTOMY    . FOOT SURGERY Left 1993   to free the nerve in ankle  . 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 REPAIR VAGINAL CUFF N/A 10/26/2017   Procedure: REPAIR VAGINAL CUFF;  Surgeon: Harlin Heys, MD;  Location: [facility] ORS;  Service: Gynecology;  Laterality: N/A;    No current facility-administered medications on file prior to encounter.    Current Outpatient Medications on File Prior to Encounter  Medication Sig Dispense Refill  . cetirizine (ZYRTEC) 10 MG tablet Take 10 mg by mouth at bedtime.   5  . clonazePAM (KLONOPIN) 0.5 MG tablet Take 0.5 mg by mouth 2 (two) times daily.    . diclofenac sodium (VOLTAREN) 1 % GEL Apply 2-4 g to painful  area of skin 4 times per day if tolerated. Patient with history of strain, sprain, and muscle spasms of extremities 500 g 0  . DULoxetine (CYMBALTA) 30 MG capsule Take 30-60 mg by mouth See admin instructions. Take 60 mg by mouth in the morning and 30 mg by mouth at bedtime.  0  . estradiol (ESTRACE) 1 MG tablet Take 1.5 tablets (1.5 mg total) by mouth daily. 45 tablet 1  . ibuprofen (ADVIL,MOTRIN) 200 MG tablet Take 800 mg by mouth daily as needed for moderate pain.    Marland Kitchen omeprazole (PRILOSEC) 40 MG capsule TAKE 1 TABLET BY MOUTH DAILY AT BEDTIME FOR REFLUX  5  . Oxycodone HCl 10 MG TABS Limit 4-6 tablets po daily if tolerated (Patient taking differently: Take 10 mg by mouth every 4 (four) hours. ) 180  tablet 0  . pravastatin (PRAVACHOL) 20 MG tablet Take 20 mg by mouth at bedtime.    . prazosin (MINIPRESS) 1 MG capsule Take 1-2 mg by mouth See admin instructions. Take 1 mg by mouth twice daily and 2 mg at bedtime    . propranolol ER (INDERAL LA) 60 MG 24 hr capsule Take 60 mg by mouth daily.    . QUEtiapine (SEROQUEL) 400 MG tablet Take 400 mg by mouth at bedtime.  2  . tiZANidine (ZANAFLEX) 4 MG tablet Take 6-8 mg by mouth See admin instructions. Take 6 mg by mouth twice daily and 8 mg at bedtime  2  . topiramate (TOPAMAX) 50 MG tablet Limit 1-2 tabs by mouth twice a day if tolerated (Patient taking differently: Take 100 mg by mouth 2 (two) times daily. ) 120 tablet 2  . triamcinolone (KENALOG) 0.025 % cream APPLY TO AFFECTED AREA TWICE DAILY AS NEEDED FOR ECZEMA  2  . vitamin B-12 (CYANOCOBALAMIN) 1000 MCG tablet Take 1,000 mcg by mouth daily as needed (low b12).    . zolpidem (AMBIEN) 10 MG tablet Take 10 mg by mouth once daily at bedtime  5    Allergies  Allergen Reactions  . Goat-Derived Products Swelling    Swelling and severe itching if near live goats  . Other Swelling    COCKROACHES cause throat and lip swelling.  . Adhesive [Tape] Rash    Paper tape causes a rash as well    Social History   Socioeconomic History  . Marital status: Married    Spouse name: Not on file  . Number of children: Not on file  . Years of education: Not on file  . Highest education level: Not on file  Social Needs  . Financial resource strain: Not on file  . Food insecurity - worry: Not on file  . Food insecurity - inability: Not on file  . Transportation needs - medical: Not on file  . Transportation needs - non-medical: Not on file  Occupational History  . Not on file  Tobacco Use  . Smoking status: Never Smoker  . Smokeless tobacco: Never Used  Substance and Sexual Activity  . Alcohol use: No    Alcohol/week: 0.0 oz  . Drug use: No  . Sexual activity: No    Birth  control/protection: Surgical  Other Topics Concern  . Not on file  Social History Narrative  . Not on file    Family History  Problem Relation Age of Onset  . Cancer Mother   . Breast cancer Mother     The following portions of the patient's history were reviewed and updated as appropriate: allergies,  current medications, past family history, past medical history, past social history, past surgical history and problem list.  Review of Systems Review of Systems  Constitutional: Positive for malaise/fatigue. Negative for chills, diaphoresis and fever.  HENT: Negative.   Respiratory: Negative for shortness of breath.   Cardiovascular: Negative.  Negative for chest pain.  Gastrointestinal: Negative for abdominal pain, constipation, diarrhea, nausea and vomiting.  Genitourinary: Negative.        Vaginal bleeding times 24 hours, bright red without clots  Musculoskeletal: Negative.   Skin: Negative.   Neurological: Positive for weakness.  Endo/Heme/Allergies: Negative.   Psychiatric/Behavioral: Negative.    Objective:   BP 126/61 (BP Location: Right Arm)   Pulse 87   Temp 98.8 F (37.1 C) (Oral)   Resp 18   Ht 5\' 7"  (1.702 m)   Wt 200 lb (90.7 kg)   LMP 09/30/2017 (Approximate)   SpO2 95%   BMI 31.32 kg/m  CONSTITUTIONAL: Well-developed, well-nourished female in no acute distress.  HENT:  Normocephalic, atraumatic.  NECK: Normal range of motion, supple, no masses.  Normal thyroid.  SKIN: Skin is warm and dry. No rash noted. Not diaphoretic. No erythema. No pallor. [place]: Alert and oriented to person, place, and time. PSYCHIATRIC: Normal mood and affect. Normal behavior. Normal judgment and thought content. CARDIOVASCULAR:Not Examined RESPIRATORY: Not Examined BREASTS: Not Examined ABDOMEN: Soft, non distended; Non tender.  No Organomegaly. PELVIC:  External Genitalia: Normal  BUS: Normal  Vagina: Normal vaginal cuff is intact; suture line at the vaginal apex is  noted to be intact.  Bimanual exam reveals an intact cuff and no palpable masses or significant tenderness.  Cervix: Surgically absent  Uterus: Surgically absent  Adnexa: Normal; nonpalpable and nontender  RV: Normal external exam  Bladder: Nontender MUSCULOSKELETAL: Normal range of motion. No tenderness.  No cyanosis, clubbing, or edema.     Assessment:   1.  12 days status post TAH/BSO 2.  Episode of vaginal bleeding postoperatively with vaginal cuff sutures placed in the PACU on day of surgery 3.  24 hours of vaginal bleeding, now resolved, without obvious source of bleed  Plan:   1.  Reassurance is given to the patient 2.  CBC is ordered and pending 3.  Patient is to place 1 or 2 tampons intravaginal should bright red bleeding recur.  If she soaks through 1 or more tampons, she is to return to the emergency room 4.  Patient is to return in 72 hours to the clinic for further follow-up with me.  A total of 25 minutes were spent face-to-face with the patient during this encounter and over half of that time involved counseling and coordination of care.  Brayton Mars, MD  Note: This dictation was prepared with Dragon dictation along with smaller phrase technology. Any transcriptional errors that result from this process are unintentional.

## 2017-11-07 NOTE — ED Notes (Signed)
Spoke with Dr. Enzo Bi who gave me admission orders.  He also asked to have pt place a tampon at this time, will give pt tampon.

## 2017-11-08 ENCOUNTER — Inpatient Hospital Stay: Payer: Medicare Other | Admitting: Anesthesiology

## 2017-11-08 ENCOUNTER — Encounter
Admission: EM | Disposition: A | Discharge status: Home / Self Care | Payer: Self-pay | Source: Home / Self Care | Attending: Obstetrics and Gynecology

## 2017-11-08 DIAGNOSIS — N99820 Postprocedural hemorrhage and hematoma of a genitourinary system organ or structure following a genitourinary system procedure: Principal | ICD-10-CM

## 2017-11-08 DIAGNOSIS — Z9889 Other specified postprocedural states: Secondary | ICD-10-CM

## 2017-11-08 HISTORY — PX: LAPAROSCOPY: SHX197

## 2017-11-08 LAB — CBC
HEMATOCRIT: 29.7 % — AB (ref 35.0–47.0)
HEMOGLOBIN: 9.8 g/dL — AB (ref 12.0–16.0)
MCH: 29 pg (ref 26.0–34.0)
MCHC: 33 g/dL (ref 32.0–36.0)
MCV: 87.9 fL (ref 80.0–100.0)
Platelets: 578 10*3/uL — ABNORMAL HIGH (ref 150–440)
RBC: 3.38 MIL/uL — ABNORMAL LOW (ref 3.80–5.20)
RDW: 13.8 % (ref 11.5–14.5)
WBC: 13.7 10*3/uL — AB (ref 3.6–11.0)

## 2017-11-08 SURGERY — LAPAROSCOPY, DIAGNOSTIC
Anesthesia: General

## 2017-11-08 MED ORDER — CLONAZEPAM 0.5 MG PO TABS
0.5000 mg | ORAL_TABLET | Freq: Two times a day (BID) | ORAL | Status: DC
  Administered 2017-11-08 (×2): 0.5 mg via ORAL
  Filled 2017-11-08 (×2): qty 1

## 2017-11-08 MED ORDER — VITAMIN B-12 1000 MCG PO TABS
1000.0000 ug | ORAL_TABLET | Freq: Every day | ORAL | Status: DC | PRN
  Filled 2017-11-08: qty 1

## 2017-11-08 MED ORDER — MIDAZOLAM HCL 2 MG/2ML IJ SOLN
INTRAMUSCULAR | Status: DC | PRN
  Administered 2017-11-08: 2 mg via INTRAVENOUS

## 2017-11-08 MED ORDER — TIZANIDINE HCL 4 MG PO TABS
8.0000 mg | ORAL_TABLET | Freq: Every day | ORAL | Status: DC
  Administered 2017-11-08: 8 mg via ORAL
  Filled 2017-11-08: qty 2

## 2017-11-08 MED ORDER — SIMETHICONE 80 MG PO CHEW
80.0000 mg | CHEWABLE_TABLET | ORAL | Status: DC | PRN
  Administered 2017-11-08 (×2): 80 mg via ORAL
  Filled 2017-11-08 (×3): qty 1

## 2017-11-08 MED ORDER — ESTRADIOL 1 MG PO TABS
1.5000 mg | ORAL_TABLET | Freq: Every day | ORAL | Status: DC
  Administered 2017-11-08 – 2017-11-12 (×5): 1.5 mg via ORAL
  Filled 2017-11-08 (×5): qty 1.5

## 2017-11-08 MED ORDER — HYDROMORPHONE HCL 1 MG/ML IJ SOLN
INTRAMUSCULAR | Status: AC
  Filled 2017-11-08: qty 1

## 2017-11-08 MED ORDER — ONDANSETRON HCL 4 MG/2ML IJ SOLN
4.0000 mg | Freq: Once | INTRAMUSCULAR | Status: AC | PRN
  Administered 2017-11-08: 4 mg via INTRAVENOUS

## 2017-11-08 MED ORDER — HYDROMORPHONE HCL 1 MG/ML IJ SOLN
1.0000 mg | INTRAMUSCULAR | Status: DC | PRN
  Administered 2017-11-08: 1 mg via INTRAVENOUS
  Filled 2017-11-08: qty 1

## 2017-11-08 MED ORDER — PROMETHAZINE HCL 25 MG/ML IJ SOLN
6.2500 mg | Freq: Once | INTRAMUSCULAR | Status: AC
  Administered 2017-11-08: 6.25 mg via INTRAVENOUS

## 2017-11-08 MED ORDER — ZOLPIDEM TARTRATE 5 MG PO TABS
5.0000 mg | ORAL_TABLET | Freq: Every day | ORAL | Status: DC
  Administered 2017-11-08: 5 mg via ORAL
  Filled 2017-11-08: qty 1

## 2017-11-08 MED ORDER — PROPRANOLOL HCL ER 60 MG PO CP24
60.0000 mg | ORAL_CAPSULE | Freq: Every day | ORAL | Status: DC
  Administered 2017-11-08 – 2017-11-12 (×4): 60 mg via ORAL
  Filled 2017-11-08 (×5): qty 1

## 2017-11-08 MED ORDER — PROPOFOL 10 MG/ML IV BOLUS
INTRAVENOUS | Status: DC | PRN
  Administered 2017-11-08: 150 mg via INTRAVENOUS

## 2017-11-08 MED ORDER — FENTANYL CITRATE (PF) 100 MCG/2ML IJ SOLN
INTRAMUSCULAR | Status: AC
  Filled 2017-11-08: qty 2

## 2017-11-08 MED ORDER — HYDROMORPHONE HCL 2 MG PO TABS
2.0000 mg | ORAL_TABLET | ORAL | Status: DC | PRN
  Administered 2017-11-08: 2 mg via ORAL
  Filled 2017-11-08: qty 1

## 2017-11-08 MED ORDER — PANTOPRAZOLE SODIUM 40 MG PO TBEC
40.0000 mg | DELAYED_RELEASE_TABLET | Freq: Every day | ORAL | Status: DC
  Administered 2017-11-08 – 2017-11-12 (×5): 40 mg via ORAL
  Filled 2017-11-08 (×5): qty 1

## 2017-11-08 MED ORDER — QUETIAPINE FUMARATE 200 MG PO TABS
400.0000 mg | ORAL_TABLET | Freq: Every day | ORAL | Status: DC
  Administered 2017-11-08: 400 mg via ORAL
  Filled 2017-11-08: qty 1

## 2017-11-08 MED ORDER — FENTANYL CITRATE (PF) 100 MCG/2ML IJ SOLN
INTRAMUSCULAR | Status: DC | PRN
  Administered 2017-11-08 (×2): 50 ug via INTRAVENOUS
  Administered 2017-11-08: 100 ug via INTRAVENOUS

## 2017-11-08 MED ORDER — KETOROLAC TROMETHAMINE 30 MG/ML IJ SOLN
30.0000 mg | Freq: Three times a day (TID) | INTRAMUSCULAR | Status: DC
  Administered 2017-11-09: 30 mg via INTRAVENOUS
  Filled 2017-11-08: qty 1

## 2017-11-08 MED ORDER — FENTANYL CITRATE (PF) 100 MCG/2ML IJ SOLN
25.0000 ug | INTRAMUSCULAR | Status: AC | PRN
  Administered 2017-11-08 (×6): 25 ug via INTRAVENOUS

## 2017-11-08 MED ORDER — PRAZOSIN HCL 1 MG PO CAPS
1.0000 mg | ORAL_CAPSULE | ORAL | Status: DC
Start: 2017-11-08 — End: 2017-11-09

## 2017-11-08 MED ORDER — LACTATED RINGERS IV SOLN
INTRAVENOUS | Status: DC | PRN
  Administered 2017-11-08: 06:00:00 via INTRAVENOUS

## 2017-11-08 MED ORDER — TIZANIDINE HCL 4 MG PO TABS: 6.0000 mg | ORAL_TABLET | ORAL | Status: DC

## 2017-11-08 MED ORDER — LIDOCAINE HCL (CARDIAC) 20 MG/ML IV SOLN
INTRAVENOUS | Status: DC | PRN
  Administered 2017-11-08: 100 mg via INTRAVENOUS

## 2017-11-08 MED ORDER — ONDANSETRON HCL 4 MG/2ML IJ SOLN
4.0000 mg | Freq: Four times a day (QID) | INTRAMUSCULAR | Status: DC | PRN
  Administered 2017-11-08 – 2017-11-09 (×2): 4 mg via INTRAVENOUS
  Filled 2017-11-08 (×2): qty 2

## 2017-11-08 MED ORDER — PROPOFOL 10 MG/ML IV BOLUS
INTRAVENOUS | Status: AC
  Filled 2017-11-08: qty 20

## 2017-11-08 MED ORDER — PROMETHAZINE HCL 25 MG/ML IJ SOLN
INTRAMUSCULAR | Status: AC
  Filled 2017-11-08: qty 1

## 2017-11-08 MED ORDER — PROMETHAZINE HCL 25 MG/ML IJ SOLN
25.0000 mg | Freq: Four times a day (QID) | INTRAMUSCULAR | Status: DC
  Administered 2017-11-08: 25 mg via INTRAVENOUS
  Filled 2017-11-08: qty 1

## 2017-11-08 MED ORDER — ONDANSETRON HCL 4 MG/2ML IJ SOLN
INTRAMUSCULAR | Status: DC | PRN
  Administered 2017-11-08: 4 mg via INTRAVENOUS

## 2017-11-08 MED ORDER — PROMETHAZINE HCL 25 MG/ML IJ SOLN
12.5000 mg | Freq: Once | INTRAMUSCULAR | Status: AC
  Administered 2017-11-08: 12.5 mg via INTRAVENOUS
  Filled 2017-11-08: qty 1

## 2017-11-08 MED ORDER — SODIUM CHLORIDE 0.9 % IJ SOLN
INTRAMUSCULAR | Status: AC
  Administered 2017-11-08: 10 mL
  Filled 2017-11-08: qty 10

## 2017-11-08 MED ORDER — LORATADINE 10 MG PO TABS
10.0000 mg | ORAL_TABLET | Freq: Every day | ORAL | Status: DC
  Administered 2017-11-08 – 2017-11-12 (×5): 10 mg via ORAL
  Filled 2017-11-08 (×5): qty 1

## 2017-11-08 MED ORDER — DULOXETINE HCL 30 MG PO CPEP
30.0000 mg | ORAL_CAPSULE | ORAL | Status: DC
  Administered 2017-11-09: 30 mg via ORAL
  Filled 2017-11-08: qty 1

## 2017-11-08 MED ORDER — DEXAMETHASONE SODIUM PHOSPHATE 10 MG/ML IJ SOLN
INTRAMUSCULAR | Status: DC | PRN
  Administered 2017-11-08: 10 mg via INTRAVENOUS

## 2017-11-08 MED ORDER — PRAVASTATIN SODIUM 20 MG PO TABS
20.0000 mg | ORAL_TABLET | Freq: Every day | ORAL | Status: DC
  Administered 2017-11-08 – 2017-11-11 (×4): 20 mg via ORAL
  Filled 2017-11-08 (×5): qty 1

## 2017-11-08 MED ORDER — SUCCINYLCHOLINE CHLORIDE 20 MG/ML IJ SOLN
INTRAMUSCULAR | Status: DC | PRN
  Administered 2017-11-08: 100 mg via INTRAVENOUS

## 2017-11-08 MED ORDER — HYDROMORPHONE HCL 2 MG PO TABS: 2.0000 mg | ORAL_TABLET | ORAL | Status: DC | PRN

## 2017-11-08 MED ORDER — SUGAMMADEX SODIUM 200 MG/2ML IV SOLN
INTRAVENOUS | Status: DC | PRN
  Administered 2017-11-08: 200 mg via INTRAVENOUS

## 2017-11-08 MED ORDER — ACETAMINOPHEN 10 MG/ML IV SOLN
INTRAVENOUS | Status: AC
  Filled 2017-11-08: qty 100

## 2017-11-08 MED ORDER — ZOLPIDEM TARTRATE 5 MG PO TABS: 10.0000 mg | ORAL_TABLET | Freq: Every day | ORAL | Status: DC

## 2017-11-08 MED ORDER — ROCURONIUM BROMIDE 100 MG/10ML IV SOLN
INTRAVENOUS | Status: DC | PRN
  Administered 2017-11-08: 10 mg via INTRAVENOUS

## 2017-11-08 MED ORDER — TIZANIDINE HCL 4 MG PO TABS
6.0000 mg | ORAL_TABLET | Freq: Two times a day (BID) | ORAL | Status: DC
  Administered 2017-11-08: 6 mg via ORAL
  Filled 2017-11-08 (×2): qty 1

## 2017-11-08 MED ORDER — MIDAZOLAM HCL 2 MG/2ML IJ SOLN
INTRAMUSCULAR | Status: AC
  Filled 2017-11-08: qty 2

## 2017-11-08 MED ORDER — ACETAMINOPHEN 500 MG PO TABS
1000.0000 mg | ORAL_TABLET | Freq: Four times a day (QID) | ORAL | Status: DC
  Administered 2017-11-08 (×2): 1000 mg via ORAL
  Filled 2017-11-08 (×2): qty 2

## 2017-11-08 MED ORDER — IBUPROFEN 800 MG PO TABS
800.0000 mg | ORAL_TABLET | Freq: Every day | ORAL | Status: DC | PRN
  Administered 2017-11-08 (×2): 800 mg via ORAL
  Filled 2017-11-08 (×3): qty 1

## 2017-11-08 MED ORDER — HYDROMORPHONE BOLUS VIA INFUSION: 1.0000 mg | INTRAVENOUS | Status: DC | PRN

## 2017-11-08 MED ORDER — HYDROMORPHONE HCL 1 MG/ML IJ SOLN
0.5000 mg | INTRAMUSCULAR | Status: AC | PRN
  Administered 2017-11-08 (×4): 0.25 mg via INTRAVENOUS

## 2017-11-08 MED ORDER — ONDANSETRON HCL 4 MG/2ML IJ SOLN
INTRAMUSCULAR | Status: AC
  Administered 2017-11-09: 4 mg via INTRAVENOUS
  Filled 2017-11-08: qty 2

## 2017-11-08 SURGICAL SUPPLY — 43 items
ADH SKN CLS APL DERMABOND .7 (GAUZE/BANDAGES/DRESSINGS) ×1
ANCHOR TIS RET SYS 235ML (MISCELLANEOUS) IMPLANT
BAG TISS RTRVL C235 10X14 (MISCELLANEOUS)
BLADE SURG SZ11 CARB STEEL (BLADE) ×2 IMPLANT
CANISTER SUCT 1200ML W/VALVE (MISCELLANEOUS) ×2 IMPLANT
CATH ROBINSON RED A/P 16FR (CATHETERS) ×2 IMPLANT
CHLORAPREP W/TINT 26ML (MISCELLANEOUS) ×2 IMPLANT
DERMABOND ADVANCED (GAUZE/BANDAGES/DRESSINGS) ×1
DERMABOND ADVANCED .7 DNX12 (GAUZE/BANDAGES/DRESSINGS) ×1 IMPLANT
DRSG TEGADERM 2-3/8X2-3/4 SM (GAUZE/BANDAGES/DRESSINGS) ×6 IMPLANT
DRSG TEGADERM 4X4.75 (GAUZE/BANDAGES/DRESSINGS) ×6 IMPLANT
DRSG TELFA 3X8 NADH (GAUZE/BANDAGES/DRESSINGS) ×2 IMPLANT
DRSG TELFA 4X3 1S NADH ST (GAUZE/BANDAGES/DRESSINGS) ×6 IMPLANT
GLOVE BIO SURGEON STRL SZ8 (GLOVE) ×2 IMPLANT
GLOVE INDICATOR 8.0 STRL GRN (GLOVE) ×2 IMPLANT
GOWN STRL REUS W/ TWL LRG LVL3 (GOWN DISPOSABLE) ×1 IMPLANT
GOWN STRL REUS W/ TWL XL LVL3 (GOWN DISPOSABLE) ×1 IMPLANT
GOWN STRL REUS W/TWL LRG LVL3 (GOWN DISPOSABLE) ×2
GOWN STRL REUS W/TWL XL LVL3 (GOWN DISPOSABLE) ×2
HEMOSTAT ARISTA ABSORB 3G PWDR (MISCELLANEOUS) ×1 IMPLANT
IRRIGATION STRYKERFLOW (MISCELLANEOUS) IMPLANT
IRRIGATOR STRYKERFLOW (MISCELLANEOUS)
IV LACTATED RINGERS 1000ML (IV SOLUTION) ×2 IMPLANT
IV NS 1000ML (IV SOLUTION) ×2
IV NS 1000ML BAXH (IV SOLUTION) ×1 IMPLANT
KIT PINK PAD W/HEAD ARE REST (MISCELLANEOUS) ×2
KIT PINK PAD W/HEAD ARM REST (MISCELLANEOUS) ×1 IMPLANT
KIT TURNOVER CYSTO (KITS) ×2 IMPLANT
LABEL OR SOLS (LABEL) IMPLANT
NS IRRIG 500ML POUR BTL (IV SOLUTION) ×2 IMPLANT
PACK GYN LAPAROSCOPIC (MISCELLANEOUS) ×2 IMPLANT
PAD DRESSING TELFA 3X8 NADH (GAUZE/BANDAGES/DRESSINGS) ×1 IMPLANT
PAD OB MATERNITY 4.3X12.25 (PERSONAL CARE ITEMS) ×2 IMPLANT
PAD PREP 24X41 OB/GYN DISP (PERSONAL CARE ITEMS) ×2 IMPLANT
SCISSORS METZENBAUM CVD 33 (INSTRUMENTS) IMPLANT
SLEEVE ENDOPATH XCEL 5M (ENDOMECHANICALS) ×2 IMPLANT
SUT MNCRL 4-0 (SUTURE) ×2
SUT MNCRL 4-0 27XMFL (SUTURE) ×1
SUT VIC AB 0 UR5 27 (SUTURE) ×2 IMPLANT
SUTURE MNCRL 4-0 27XMF (SUTURE) ×1 IMPLANT
SYR 30ML LL (SYRINGE) ×2 IMPLANT
TROCAR XCEL NON-BLD 5MMX100MML (ENDOMECHANICALS) ×2 IMPLANT
TUBING INSUFFLATION (TUBING) ×2 IMPLANT

## 2017-11-08 NOTE — Anesthesia Procedure Notes (Signed)
Procedure Name: Intubation Date/Time: 11/08/2017 6:46 AM Performed by: Nelda Marseille, CRNA Pre-anesthesia Checklist: Patient identified, Patient being monitored, Timeout performed, Emergency Drugs available and Suction available Patient Re-evaluated:Patient Re-evaluated prior to induction Oxygen Delivery Method: Circle system utilized Preoxygenation: Pre-oxygenation with 100% oxygen Induction Type: IV induction Ventilation: Mask ventilation without difficulty Laryngoscope Size: Mac, 3 and Glidescope Grade View: Grade I Tube type: Oral Tube size: 7.0 mm Number of attempts: 1 Airway Equipment and Method: Stylet Placement Confirmation: ETT inserted through vocal cords under direct vision,  positive ETCO2 and breath sounds checked- equal and bilateral Secured at: 21 cm Tube secured with: Tape Dental Injury: Teeth and Oropharynx as per pre-operative assessment  Difficulty Due To: Difficult Airway- due to anterior larynx, Difficult Airway- due to dentition and Difficulty was anticipated

## 2017-11-08 NOTE — Anesthesia Postprocedure Evaluation (Signed)
Anesthesia Post Note  Patient: Jill Rivas  Procedure(s) Performed: LAPAROSCOPY DIAGNOSTIC (N/A )  Patient location during evaluation: PACU Anesthesia Type: General Level of consciousness: awake and alert and oriented Pain management: pain level controlled Vital Signs Assessment: post-procedure vital signs reviewed and stable Respiratory status: spontaneous breathing Cardiovascular status: blood pressure returned to baseline Anesthetic complications: no     Last Vitals:  Vitals:   11/08/17 1007 11/08/17 1110  BP: 140/81 140/74  Pulse: 80 80  Resp: 14 16  Temp: 36.7 C 36.7 C  SpO2: 100% 99%    Last Pain:  Vitals:   11/08/17 1110  TempSrc: Oral  PainSc:                  [name]

## 2017-11-08 NOTE — Op Note (Signed)
OPERATIVE NOTE:  EUGENA RHUE PROCEDURE DATE: 11/08/2017   PREOPERATIVE DIAGNOSIS: 1.  Vaginal bleeding 2.  Abnormal pelvic ultrasound with evidence of possible intra-abdominal bleed 3.  Status post TAH/BSO 10/26/2017  POSTOPERATIVE DIAGNOSIS: 1.  Vaginal bleeding 2.  Abnormal pelvic ultrasound with evidence of possible intra-abdominal bleed 3.  Intra-abdominal bleed, resolved 4.  Status post TAH/BSO 10/26/2017   PROCEDURE: Laparoscopy with adhesio lysis SURGEON:  Brayton Mars, MD ASSISTANTS: None ANESTHESIA: General INDICATIONS: 47 y.o. G0P0000 status post TAH/BSO on 10/26/2017; status post repair of vaginal vault with postoperative bleeding on 10/26/2017, presents now with acute onset vaginal bleeding times 24 hours and drop in hemoglobin to 8.6 g; pelvic ultrasound demonstrates fluid within the abdominal pelvic cavity consistent with possible intra-abdominal bleed  FINDINGS: 300 cc of clot identified within the pelvis with extensive bowel omental adhesions to the pelvic sidewall and vaginal cuff.  Following adhesio lysis and copious irrigation, there is no evidence of active bleeding, specifically looking at the vaginal cuff as well as the infundibulopelvic ligaments bilaterally.   I/O's: No intake/output data recorded. COUNTS:  YES SPECIMENS: None ANTIBIOTIC PROPHYLAXIS:N/A COMPLICATIONS: None immediate  PROCEDURE IN DETAIL: Patient was brought to the operating room and placed in supine position.  Because of her complex regional pain syndrome of lower extremities, she was placed in the bumblebee stirrups prior to induction of anesthesia.  Once adequately situated, general endotracheal anesthesia was induced without difficulty.  A ChloraPrep and Betadine abdominal perineal and intravaginal prep and drape was performed. Timeout was completed. Sponge stick was placed into the vagina.  Red Robinson catheter was used to drain concentrated urine from the  bladder. Laparoscopy was performed in standard fashion.  Subumbilical vertical incision 5 mm in length was made.  The Optiview laparoscopic trocar system was placed directly into the abdominal pelvic cavity without evidence of bowel or vascular injury.  2 other 5 mm ports were placed in the lower abdomen on the right and left sides respectively under direct visualization.  Pneumoperitoneum was created with CO2 gas.  The above-noted findings were identified.  75 minutes of adhesio lysis, irrigation, and removal of clot was performed with the aid of graspers as well as the Chief Executive Officer.  Close inspection of all vascular pedicles revealed no evidence of active bleeding.  The vaginal cuff is well approximated without evidence of vaginal bleeding.  Pelvic anatomy was restored with removal of clot and scar tissue.  Once satisfied with hemostasis 3 g of Arista was applied to the vaginal cuff and infundibulopelvic ligament region with the procedure was then terminated with all instrumentation being removed from the abdominal pelvic cavity.  Pneumoperitoneum was released.  Incisions were closed with 4-0 Vicryl suture and Dermabond glue was placed over the incisions.  Patient was awakened mobilized and taken recovery room in satisfactory condition  Estimated blood loss was minimal (300 mL of clot evacuated). Complications: None  Martin A. Zipporah Plants, MD, [facility]

## 2017-11-08 NOTE — Anesthesia Post-op Follow-up Note (Signed)
Anesthesia QCDR form completed.        

## 2017-11-08 NOTE — Progress Notes (Signed)
PREOP NOTE: Preop diagnosis: 1.  Vaginal bleeding 2.  Abnormal ultrasound with intra-abdominal fluid-this is for bleeding 3.  Status post TAH/BSO on 10/26/2017  Procedure: Diagnostic laparoscopy  CBC Latest Ref Rng & Units 11/07/2017 10/22/2017 07/11/2017  WBC 3.6 - 11.0 K/uL 13.4(H) 5.5 7.6  Hemoglobin 12.0 - 16.0 g/dL 8.6(L) 13.1 13.3  Hematocrit 35.0 - 47.0 % 26.0(L) 39.2 39.6  Platelets 150 - 440 K/uL 447(H) 254 258   ULTRASOUND 11/07/2017 CLINICAL DATA:  Bleeding status post hysterectomy and bilateral salpingo-oophorectomy on 10/26/2017.  EXAM: TRANSABDOMINAL AND TRANSVAGINAL ULTRASOUND OF PELVIS  TECHNIQUE: Both transabdominal and transvaginal ultrasound examinations of the pelvis were performed. Transabdominal technique was performed for global imaging of the pelvis including uterus, ovaries, adnexal regions, and pelvic cul-de-sac. It was necessary to proceed with endovaginal exam following the transabdominal exam to visualize the adnexal regions to an adequate degree.  COMPARISON:  None  FINDINGS: Status post hysterectomy and bilateral salpingo oophorectomy.  Large complex fluid collection is seen within the midline pelvis extending into the bilateral adnexal regions, presumed blood products, active bleeding suspected. Sonographer describes active bleeding near the vaginal cuff.  Largest component of the collection is measured at approximately 7 cm, although the overall collection is likely larger and difficult to measure due to its serpiginous configuration.  IMPRESSION: Large complex fluid collection within the pelvis, extending into the bilateral adnexal regions, presumably blood products/hematoma, largest component measuring approximately 7 cm. Active bleeding identified by the sonographer near the vaginal cuff.  These results were called by telephone at the time of interpretation on 11/07/2017 at 8:45 pm to Dr. Hassell Done Cindy Brindisi , who  verbally acknowledged these results.   Electronically Signed   By: Franki Cabot M.D.   On: 11/07/2017 20:47   Surgeon: Dakota Vanwart  Anesthesia: General  Preop counseling: The patient is to undergo laparoscopy for evaluation of possible intra-abdominal bleeding based on dropping hemoglobin and abnormal pelvic ultrasound suspicious for fluid within the pelvis.  The patient is understanding of the planned procedure and is aware of and is accepting of all surgical risks which include but are not limited to bleeding, infection, pelvic organ injury with need for repair, blood clot disorders, possible need for blood transfusion, anesthesia risk, etc.  She does understand that if there is active bleeding which cannot be controlled through laparoscopy procedures, laparotomy will be potentially needed.  All questions have been answered.  Informed consent is given.  Patient is ready and willing to proceed with surgery as scheduled.

## 2017-11-08 NOTE — Progress Notes (Signed)
DAY OF SURGERY: Status post laparoscopic adhesiolysis and evacuation of hematoma. Subjective: Patient with nausea and vomiting; taking sips of fluids.  Pain is moderate especially in left leg and right lower quadrant Objective:BP 140/74 (BP Location: Left Arm)   Pulse 80   Temp 98 F (36.7 C) (Oral)   Resp 16   Ht 5\' 7"  (1.702 m)   Wt 200 lb (90.7 kg)   LMP 09/30/2017 (Approximate)   SpO2 99%   BMI 31.32 kg/m  Alert oriented female; no acute distress Abdomen: Soft, mild tenderness without guarding; incisions are covered with Dermabond and without drainage. Extremities: Nontender, warm and dry  CBC Latest Ref Rng & Units 11/08/2017 11/07/2017 10/22/2017  WBC 3.6 - 11.0 K/uL 13.7(H) 13.4(H) 5.5  Hemoglobin 12.0 - 16.0 g/dL 9.8(L) 8.6(L) 13.1  Hematocrit 35.0 - 47.0 % 29.7(L) 26.0(L) 39.2  Platelets 150 - 440 K/uL 578(H) 447(H) 254   Assessment: 1.  Status post laparoscopic adhesiolysis and evacuation of pelvic line 2.  Postop nausea with left leg and lower right abdomen discomfort, nonacute  Plan: 1.  Continue with postop care 2.  Convert to oral Dilaudid 3.  May add muscle relaxants to regimen for pain relief due to patient's complex regional pain syndrome of lower limb 4.  Anticipate discharge tomorrow  Brayton Mars, MD

## 2017-11-08 NOTE — Interval H&P Note (Signed)
History and Physical Interval Note:  11/08/2017 5:58 AM  Jill Rivas  has presented today for surgery, with the diagnosis of vaginal bleeding s/p surgery  The various methods of treatment have been discussed with the patient and family. After consideration of risks, benefits and other options for treatment, the patient has consented to  Procedure(s): LAPAROSCOPY DIAGNOSTIC (N/A) as a surgical intervention .  The patient's history has been reviewed, patient examined, no change in status, stable for surgery.  I have reviewed the patient's chart and labs.  Questions were answered to the patient's satisfaction.     Hassell Done A [name]

## 2017-11-08 NOTE — Progress Notes (Signed)
15 minute call to floor. 

## 2017-11-08 NOTE — H&P (View-Only) (Signed)
PREOP NOTE: Preop diagnosis: 1.  Vaginal bleeding 2.  Abnormal ultrasound with intra-abdominal fluid-this is for bleeding 3.  Status post TAH/BSO on 10/26/2017  Procedure: Diagnostic laparoscopy  CBC Latest Ref Rng & Units 11/07/2017 10/22/2017 07/11/2017  WBC 3.6 - 11.0 K/uL 13.4(H) 5.5 7.6  Hemoglobin 12.0 - 16.0 g/dL 8.6(L) 13.1 13.3  Hematocrit 35.0 - 47.0 % 26.0(L) 39.2 39.6  Platelets 150 - 440 K/uL 447(H) 254 258   ULTRASOUND 11/07/2017 CLINICAL DATA:  Bleeding status post hysterectomy and bilateral salpingo-oophorectomy on 10/26/2017.  EXAM: TRANSABDOMINAL AND TRANSVAGINAL ULTRASOUND OF PELVIS  TECHNIQUE: Both transabdominal and transvaginal ultrasound examinations of the pelvis were performed. Transabdominal technique was performed for global imaging of the pelvis including uterus, ovaries, adnexal regions, and pelvic cul-de-sac. It was necessary to proceed with endovaginal exam following the transabdominal exam to visualize the adnexal regions to an adequate degree.  COMPARISON:  None  FINDINGS: Status post hysterectomy and bilateral salpingo oophorectomy.  Large complex fluid collection is seen within the midline pelvis extending into the bilateral adnexal regions, presumed blood products, active bleeding suspected. Sonographer describes active bleeding near the vaginal cuff.  Largest component of the collection is measured at approximately 7 cm, although the overall collection is likely larger and difficult to measure due to its serpiginous configuration.  IMPRESSION: Large complex fluid collection within the pelvis, extending into the bilateral adnexal regions, presumably blood products/hematoma, largest component measuring approximately 7 cm. Active bleeding identified by the sonographer near the vaginal cuff.  These results were called by telephone at the time of interpretation on 11/07/2017 at 8:45 pm to Dr. Hassell Done Beatriz Settles , who  verbally acknowledged these results.   Electronically Signed   By: Franki Cabot M.D.   On: 11/07/2017 20:47   Surgeon: Han Lysne  Anesthesia: General  Preop counseling: The patient is to undergo laparoscopy for evaluation of possible intra-abdominal bleeding based on dropping hemoglobin and abnormal pelvic ultrasound suspicious for fluid within the pelvis.  The patient is understanding of the planned procedure and is aware of and is accepting of all surgical risks which include but are not limited to bleeding, infection, pelvic organ injury with need for repair, blood clot disorders, possible need for blood transfusion, anesthesia risk, etc.  She does understand that if there is active bleeding which cannot be controlled through laparoscopy procedures, laparotomy will be potentially needed.  All questions have been answered.  Informed consent is given.  Patient is ready and willing to proceed with surgery as scheduled.

## 2017-11-08 NOTE — Progress Notes (Signed)
Patient has some nausea that comes and goes, states She has not had her omeprazole of 40 mg since Friday night also.  Pieces of ice chips given.

## 2017-11-08 NOTE — Anesthesia Preprocedure Evaluation (Signed)
Anesthesia Evaluation  Patient identified by MRN, date of birth, ID band Patient awake    Reviewed: Allergy & Precautions, NPO status , Patient's Chart, lab work & pertinent test results  History of Anesthesia Complications (+) PONV and history of anesthetic complications  Airway Mallampati: III  TM Distance: <3 FB     Dental  (+) Dental Advidsory Given   Pulmonary sleep apnea (none since losing weight) , neg COPD,           Cardiovascular hypertension, Pt. on medications (-) Past MI and (-) CHF + dysrhythmias (tachycardia)      Neuro/Psych neg Seizures Anxiety Depression    GI/Hepatic Neg liver ROS, GERD  Medicated,  Endo/Other  neg diabetes  Renal/GU Renal disease (stones)     Musculoskeletal   Abdominal   Peds  Hematology   Anesthesia Other Findings Patient underwent hysterectomy early and was found to have continued bleeding.  They attempted to repair the bleeding in the PACU, but were unable to do so.  We will proceed emergently to the OR for exam and repair under general anesthesia.  Reproductive/Obstetrics                             Anesthesia Physical  Anesthesia Plan  ASA: II and emergent  Anesthesia Plan: General   Post-op Pain Management:    Induction: Intravenous  PONV Risk Score and Plan: 3 and 4 or greater and Dexamethasone, Ondansetron, Midazolam and Treatment may vary due to age or medical condition  Airway Management Planned: Oral ETT  Additional Equipment:   Intra-op Plan:   Post-operative Plan: Extubation in OR  Informed Consent: I have reviewed the patients History and Physical, chart, labs and discussed the procedure including the risks, benefits and alternatives for the proposed anesthesia with the patient or authorized representative who has indicated his/her understanding and acceptance.     Plan Discussed with: Anesthesiologist, CRNA and  Surgeon  Anesthesia Plan Comments:         Anesthesia Quick Evaluation

## 2017-11-08 NOTE — Transfer of Care (Signed)
Immediate Anesthesia Transfer of Care Note  Patient: Jill Rivas  Procedure(s) Performed: LAPAROSCOPY DIAGNOSTIC (N/A )  Patient Location: PACU  Anesthesia Type:General  Level of Consciousness: awake, alert  and oriented  Airway & Oxygen Therapy: Patient Spontanous Breathing  Post-op Assessment: Report given to RN  Post vital signs: Reviewed and stable  Last Vitals:  Vitals:   11/07/17 2332 11/08/17 0459  BP: (!) 141/78 134/77  Pulse: 84 84  Resp: 18 20  Temp: 37.3 C   SpO2: 99% 97%    Last Pain:  Vitals:   11/08/17 0430  TempSrc:   PainSc: Asleep         Complications: No apparent anesthesia complications

## 2017-11-09 ENCOUNTER — Inpatient Hospital Stay: Payer: Medicare Other

## 2017-11-09 ENCOUNTER — Encounter: Payer: Self-pay | Admitting: Obstetrics and Gynecology

## 2017-11-09 DIAGNOSIS — N939 Abnormal uterine and vaginal bleeding, unspecified: Secondary | ICD-10-CM

## 2017-11-09 DIAGNOSIS — R52 Pain, unspecified: Secondary | ICD-10-CM

## 2017-11-09 DIAGNOSIS — Z9889 Other specified postprocedural states: Secondary | ICD-10-CM

## 2017-11-09 DIAGNOSIS — I9581 Postprocedural hypotension: Secondary | ICD-10-CM

## 2017-11-09 DIAGNOSIS — R578 Other shock: Secondary | ICD-10-CM

## 2017-11-09 LAB — BLOOD GAS, ARTERIAL
Acid-Base Excess: 2.2 mmol/L — ABNORMAL HIGH (ref 0.0–2.0)
Bicarbonate: 25.1 mmol/L (ref 20.0–28.0)
FIO2: 21
O2 Saturation: 96.8 %
PCO2 ART: 33 mmHg (ref 32.0–48.0)
PH ART: 7.49 — AB (ref 7.350–7.450)
Patient temperature: 37
pO2, Arterial: 81 mmHg — ABNORMAL LOW (ref 83.0–108.0)

## 2017-11-09 LAB — CBC
HEMATOCRIT: 21.4 % — AB (ref 35.0–47.0)
HEMOGLOBIN: 7.1 g/dL — AB (ref 12.0–16.0)
MCH: 28.5 pg (ref 26.0–34.0)
MCHC: 33.3 g/dL (ref 32.0–36.0)
MCV: 85.7 fL (ref 80.0–100.0)
Platelets: 391 10*3/uL (ref 150–440)
RBC: 2.5 MIL/uL — AB (ref 3.80–5.20)
RDW: 13.2 % (ref 11.5–14.5)
WBC: 13.1 10*3/uL — ABNORMAL HIGH (ref 3.6–11.0)

## 2017-11-09 LAB — COMPREHENSIVE METABOLIC PANEL
ALK PHOS: 95 U/L (ref 38–126)
ALT: 17 U/L (ref 14–54)
ANION GAP: 9 (ref 5–15)
AST: 27 U/L (ref 15–41)
Albumin: 2.9 g/dL — ABNORMAL LOW (ref 3.5–5.0)
BILIRUBIN TOTAL: 0.7 mg/dL (ref 0.3–1.2)
BUN: 12 mg/dL (ref 6–20)
CALCIUM: 8 mg/dL — AB (ref 8.9–10.3)
CO2: 22 mmol/L (ref 22–32)
CREATININE: 1.22 mg/dL — AB (ref 0.44–1.00)
Chloride: 109 mmol/L (ref 101–111)
GFR calc non Af Amer: 52 mL/min — ABNORMAL LOW (ref >60)
GFR, EST AFRICAN AMERICAN: 60 mL/min — AB (ref >60)
GLUCOSE: 170 mg/dL — AB (ref 65–99)
Potassium: 2.9 mmol/L — ABNORMAL LOW (ref 3.5–5.1)
SODIUM: 140 mmol/L (ref 135–145)
TOTAL PROTEIN: 6.1 g/dL — AB (ref 6.5–8.1)

## 2017-11-09 LAB — PROCALCITONIN: Procalcitonin: 0.56 ng/mL

## 2017-11-09 LAB — PROTIME-INR
INR: 1.36
PROTHROMBIN TIME: 16.7 s — AB (ref 11.4–15.2)

## 2017-11-09 LAB — URINE DRUG SCREEN, QUALITATIVE (ARMC ONLY)
AMPHETAMINES, UR SCREEN: NOT DETECTED
BARBITURATES, UR SCREEN: NOT DETECTED
BENZODIAZEPINE, UR SCRN: POSITIVE — AB
Cannabinoid 50 Ng, Ur ~~LOC~~: NOT DETECTED
Cocaine Metabolite,Ur ~~LOC~~: NOT DETECTED
MDMA (Ecstasy)Ur Screen: NOT DETECTED
Methadone Scn, Ur: NOT DETECTED
OPIATE, UR SCREEN: POSITIVE — AB
PHENCYCLIDINE (PCP) UR S: NOT DETECTED
Tricyclic, Ur Screen: POSITIVE — AB

## 2017-11-09 LAB — PREPARE RBC (CROSSMATCH)

## 2017-11-09 LAB — PHOSPHORUS: Phosphorus: 2.5 mg/dL (ref 2.5–4.6)

## 2017-11-09 LAB — MRSA PCR SCREENING: MRSA BY PCR: NEGATIVE

## 2017-11-09 LAB — TROPONIN I: Troponin I: <0.03 ng/mL (ref <0.03)

## 2017-11-09 LAB — MAGNESIUM: Magnesium: 1.7 mg/dL (ref 1.7–2.4)

## 2017-11-09 LAB — C DIFFICILE QUICK SCREEN W PCR REFLEX
C DIFFICILE (CDIFF) INTERP: NOT DETECTED
C Diff antigen: NEGATIVE
C Diff toxin: NEGATIVE

## 2017-11-09 LAB — GLUCOSE, CAPILLARY: GLUCOSE-CAPILLARY: 145 mg/dL — AB (ref 65–99)

## 2017-11-09 LAB — LACTIC ACID, PLASMA: Lactic Acid, Venous: 1.1 mmol/L (ref 0.5–1.9)

## 2017-11-09 MED ORDER — NALOXONE HCL 0.4 MG/ML IJ SOLN
INTRAMUSCULAR | Status: AC
  Administered 2017-11-09: 02:00:00
  Filled 2017-11-09: qty 1

## 2017-11-09 MED ORDER — POTASSIUM CHLORIDE CRYS ER 20 MEQ PO TBCR
40.0000 meq | EXTENDED_RELEASE_TABLET | Freq: Once | ORAL | Status: AC
  Administered 2017-11-09: 40 meq via ORAL
  Filled 2017-11-09: qty 2

## 2017-11-09 MED ORDER — OXYCODONE HCL ER 10 MG PO T12A
10.0000 mg | EXTENDED_RELEASE_TABLET | Freq: Two times a day (BID) | ORAL | Status: DC
  Administered 2017-11-09 – 2017-11-11 (×6): 10 mg via ORAL
  Filled 2017-11-09 (×6): qty 1

## 2017-11-09 MED ORDER — LACTATED RINGERS IV BOLUS (SEPSIS)
1000.0000 mL | Freq: Once | INTRAVENOUS | Status: AC
  Administered 2017-11-09: 1000 mL via INTRAVENOUS

## 2017-11-09 MED ORDER — PROMETHAZINE HCL 25 MG/ML IJ SOLN
12.5000 mg | INTRAMUSCULAR | Status: DC | PRN
  Administered 2017-11-09 (×2): 12.5 mg via INTRAVENOUS
  Filled 2017-11-09 (×2): qty 1

## 2017-11-09 MED ORDER — FENTANYL 40 MCG/ML IV SOLN
INTRAVENOUS | Status: DC
  Administered 2017-11-09 – 2017-11-10 (×2): via INTRAVENOUS
  Administered 2017-11-11: 10 ug via INTRAVENOUS
  Administered 2017-11-11: 90 ug via INTRAVENOUS
  Filled 2017-11-09 (×2): qty 25

## 2017-11-09 MED ORDER — SODIUM CHLORIDE 0.9% FLUSH: 9.0000 mL | INTRAVENOUS | Status: DC | PRN

## 2017-11-09 MED ORDER — FUROSEMIDE 10 MG/ML IJ SOLN
INTRAMUSCULAR | Status: AC
  Filled 2017-11-09: qty 2

## 2017-11-09 MED ORDER — LACTATED RINGERS IV BOLUS (SEPSIS)
500.0000 mL | Freq: Once | INTRAVENOUS | Status: AC
  Administered 2017-11-09: 500 mL via INTRAVENOUS

## 2017-11-09 MED ORDER — DIPHENHYDRAMINE HCL 50 MG/ML IJ SOLN: 12.5000 mg | Freq: Four times a day (QID) | INTRAMUSCULAR | Status: DC | PRN

## 2017-11-09 MED ORDER — NALOXONE HCL 0.4 MG/ML IJ SOLN
INTRAMUSCULAR | Status: AC
  Filled 2017-11-09: qty 1

## 2017-11-09 MED ORDER — ACETAMINOPHEN 325 MG PO TABS
650.0000 mg | ORAL_TABLET | ORAL | Status: DC | PRN
  Administered 2017-11-09: 650 mg via ORAL
  Filled 2017-11-09: qty 2

## 2017-11-09 MED ORDER — SODIUM CHLORIDE 0.9 % IV SOLN
Freq: Once | INTRAVENOUS | Status: AC
  Administered 2017-11-09: 06:00:00 via INTRAVENOUS

## 2017-11-09 MED ORDER — SODIUM CHLORIDE 0.9 % IV SOLN
0.0000 ug/min | INTRAVENOUS | Status: DC
  Filled 2017-11-09: qty 1

## 2017-11-09 MED ORDER — PIPERACILLIN-TAZOBACTAM 3.375 G IVPB
3.3750 g | Freq: Three times a day (TID) | INTRAVENOUS | Status: DC
  Administered 2017-11-09: 3.375 g via INTRAVENOUS
  Filled 2017-11-09: qty 50

## 2017-11-09 MED ORDER — POTASSIUM CHLORIDE 10 MEQ/100ML IV SOLN
10.0000 meq | INTRAVENOUS | Status: DC | PRN
  Filled 2017-11-09: qty 100

## 2017-11-09 MED ORDER — PHENYLEPHRINE HCL 10 MG/ML IJ SOLN: 0.0000 ug/min | Freq: Once | INTRAVENOUS | Status: DC

## 2017-11-09 MED ORDER — SODIUM CHLORIDE 0.9 % IV BOLUS (SEPSIS): 1000.0000 mL | Freq: Once | INTRAVENOUS | Status: DC

## 2017-11-09 MED ORDER — FUROSEMIDE 10 MG/ML IJ SOLN: 20.0000 mg | Freq: Once | INTRAMUSCULAR | Status: DC

## 2017-11-09 MED ORDER — QUETIAPINE FUMARATE 25 MG PO TABS
100.0000 mg | ORAL_TABLET | Freq: Every day | ORAL | Status: DC
  Administered 2017-11-09 – 2017-11-11 (×3): 100 mg via ORAL
  Filled 2017-11-09: qty 4
  Filled 2017-11-09 (×2): qty 1

## 2017-11-09 MED ORDER — ACETAMINOPHEN 10 MG/ML IV SOLN
1000.0000 mg | Freq: Four times a day (QID) | INTRAVENOUS | Status: DC | PRN
  Filled 2017-11-09: qty 100

## 2017-11-09 MED ORDER — DIPHENHYDRAMINE HCL 12.5 MG/5ML PO ELIX
12.5000 mg | ORAL_SOLUTION | Freq: Four times a day (QID) | ORAL | Status: DC | PRN
  Filled 2017-11-09: qty 5

## 2017-11-09 MED ORDER — NALOXONE HCL 0.4 MG/ML IJ SOLN: 0.4000 mg | INTRAMUSCULAR | Status: DC | PRN

## 2017-11-09 MED ORDER — LACTATED RINGERS IV BOLUS (SEPSIS)
250.0000 mL | Freq: Once | INTRAVENOUS | Status: AC
  Administered 2017-11-09: 250 mL via INTRAVENOUS

## 2017-11-09 MED ORDER — OXYCODONE HCL ER 15 MG PO T12A: 15.0000 mg | EXTENDED_RELEASE_TABLET | Freq: Two times a day (BID) | ORAL | Status: DC

## 2017-11-09 MED ORDER — NALOXONE HCL 0.4 MG/ML IJ SOLN
0.4000 mg | INTRAMUSCULAR | Status: DC | PRN
Start: 2017-11-09 — End: 2017-11-10
  Administered 2017-11-09 (×3): 0.4 mg via INTRAVENOUS
  Filled 2017-11-09: qty 1

## 2017-11-09 MED ORDER — POTASSIUM CHLORIDE 10 MEQ/100ML IV SOLN
10.0000 meq | INTRAVENOUS | Status: DC
  Administered 2017-11-09 (×2): 10 meq via INTRAVENOUS
  Filled 2017-11-09 (×4): qty 100

## 2017-11-09 NOTE — Progress Notes (Signed)
Patient transferred to ICU 14 via bed accompanied by Kerby Hockley RN and mary lee NT and patient's sister. Patient's assessment from 1900 to 0405 on (mother-baby unit room 348)was of the following and remained the same during this time: lungs clear bilaterally; skin warm and dry; color pale pink; capillary refill 2 seconds; bowel sounds present and hypoactive in all 4 quadrants; alert and oriented X 3. All WNL except hypotensive and then aroused easily, but drowsy. Jerline Pain RN and Laurita Quint RN remained with patient the entire time from 0015 until 0405 am.

## 2017-11-09 NOTE — Progress Notes (Signed)
Per Dr DeFrancesco ok to order a diet for patient

## 2017-11-09 NOTE — Progress Notes (Signed)
At Pt. Bedside for Rapid Response, Dr. Enzo Bi was transferred to my Phone and he was given verbal updates as to Pt. Statis and response to Narcan during Response. Dr. Enzo Bi stayed on the phone until Pt. B/p was above 80 Systolic. Pt. Is alert and talking with c/o left leg pain. Orders received to D/C some of Pt. Medications. Narcan and 250cc LR Bolus administered by Shawnie Pons RN during response. Shawnie Pons Present at Bedside at all time.

## 2017-11-09 NOTE — Progress Notes (Signed)
Chaplain responded to a page to #[id number] and Rapid Response to #[id number]. Chaplain offered silent prayer for the patient, her sister, and staff.

## 2017-11-09 NOTE — Progress Notes (Signed)
Rapid response personal that responded to room 348 are Rojelio Brenner RN,AC; Tia Masker RN (CCU nurse); Bob RT; and the chaplin.

## 2017-11-09 NOTE — Progress Notes (Signed)
Results for [name], [name] (MRN [id number]) as of 11/09/2017 08:51  Ref. Range 11/09/2017 03:27  Potassium Latest Ref Range: 3.5 - 5.1 mmol/L 2.9 (L)  Per Dr Alva Garnet we can IV K after second bag which is currently infusing and give 40 meqs orally now.

## 2017-11-09 NOTE — H&P (Addendum)
[facility]  Medical Consultation  Jill Rivas PYP:[id number] DOB: 1971/03/25 DOA: 11/07/2017 PCP: [facility]   Requesting physician:Dr Defrancesco Date of consultation: 11/09/17 Reason for consultation: Hypotension and lethargy  CHIEF COMPLAINT:   Chief Complaint  Patient presents with  . Post-op Problem    HISTORY OF PRESENT ILLNESS: Jill Rivas  is a 47 y.o. female with a known history of complex regional pain syndrome, depression, hypertension, migraines.   I am consulted to evaluate patient for hypotension and lethargy. Patient is status post recent TAH/BSO on 10/27/5807, complicated with vaginal bleeding requiring additional suturing of the vagina in the PACU.  She was admitted again 2 days ago through emergency room for acute onset of vaginal bleeding.  At that time her hemoglobin level was 8.8 and pelvic ultrasound showed complex fluid collection in the pelvis consistent with possible intra-abdominal bleed.  Patient underwent laparoscopy by Dr. Enzo Bi with evacuation of pelvic hematoma, 300 mL of organized clot.  Postprocedure patient has been nauseated and vomiting several times, unable to take anything p.o. she has been receiving Dilaudid IV nausea medication, and continued on her home medications, including Seroquel 400 mg nightly. Patient is currently very lethargic, but she follows some commands.  She becomes temporarily more alert after Narcan, which she received 3 doses in the past 3 hours.  Her systolic blood pressure is in the 70s, status post IV bolus with lactate ringer and continues IV fluid at 125 mL an hour with LR.  Heart rate is 71, temperature is 97.1.  Oxygen saturation is 97% and respirations are 19/minute.  She is tender with palpation at mid lower abdominal area, but no vaginal bleeding is noted.  Urine looks clear, yellow and the Foley bag.  She is moving all her extremities.   PAST MEDICAL HISTORY:   Past Medical  History:  Diagnosis Date  . Anxiety   . Bilateral occipital neuralgia   . Compression fracture of cervical vertebra ([place]) 2018  . CRPS (complex regional pain syndrome type I) 2019  . CRPS (complex regional pain syndrome)   . DDD (degenerative disc disease), thoracolumbar 2019  . Depression   . Dysrhythmia    tacchycardia and bradycardia  . GERD (gastroesophageal reflux disease)   . History of kidney stones   . Hypertension 2019   blood pressure elevated due to anxiety  . Kidney stones   . Migraines 9833   complicated migraines can go into seizure like activity.  inderal and sleep help.  Marland Kitchen PONV (postoperative nausea and vomiting)    some nausea after surgery  . PTSD (post-traumatic stress disorder)   . Reflex sympathetic dystrophy    left foot  . Reflex sympathetic dystrophy of lower limb    foot  . Sleep apnea 2017   tested again approx 1 month ago and sleep apnea has resolved.    PAST SURGICAL HISTORY:  Past Surgical History:  Procedure Laterality Date  . ABDOMINAL HYSTERECTOMY    . AUGMENTATION MAMMAPLASTY Bilateral 2005   SALINE  . BREAST ENHANCEMENT SURGERY Bilateral   . BREAST SURGERY    . CHOLECYSTECTOMY    . FOOT SURGERY Left 1993   to free the nerve in ankle  . 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 REPAIR VAGINAL CUFF N/A 10/26/2017   Procedure: REPAIR VAGINAL CUFF;  Surgeon: Harlin Heys, MD;  Location: [facility] ORS;  Service: Gynecology;  Laterality: N/A;  SOCIAL HISTORY:  Social History   Tobacco Use  . Smoking status: Never Smoker  . Smokeless tobacco: Never Used  Substance Use Topics  . Alcohol use: No    Alcohol/week: 0.0 oz    FAMILY HISTORY:  Family History  Problem Relation Age of Onset  . Cancer Mother   . Breast cancer Mother     DRUG ALLERGIES:  Allergies  Allergen Reactions  . Goat-Derived Products Swelling    Swelling and severe itching if near live goats  .  Other Swelling    COCKROACHES cause throat and lip swelling.  . Adhesive [Tape] Rash    Paper tape causes a rash as well    REVIEW OF SYSTEMS:   Unable to obtain, as patient is too lethargic.  MEDICATIONS AT HOME:  Prior to Admission medications   Medication Sig Start Date End Date Taking? Authorizing Provider  cetirizine (ZYRTEC) 10 MG tablet Take 10 mg by mouth at bedtime.  09/28/17  Yes [provider]  clonazePAM (KLONOPIN) 0.5 MG tablet Take 0.5 mg by mouth 2 (two) times daily.   Yes [provider]  diclofenac sodium (VOLTAREN) 1 % GEL Apply 2-4 g to painful area of skin 4 times per day if tolerated. Patient with history of strain, sprain, and muscle spasms of extremities 05/15/16  Yes Mohammed Kindle, MD  DULoxetine (CYMBALTA) 30 MG capsule Take 30-60 mg by mouth See admin instructions. Take 60 mg by mouth in the morning and 30 mg by mouth at bedtime. 06/23/17  Yes [provider]  estradiol (ESTRACE) 1 MG tablet Take 1.5 tablets (1.5 mg total) by mouth daily. 10/29/17  Yes Harlin Heys, MD  ibuprofen (ADVIL,MOTRIN) 200 MG tablet Take 800 mg by mouth daily as needed for moderate pain.   Yes [provider]  omeprazole (PRILOSEC) 40 MG capsule TAKE 1 TABLET BY MOUTH DAILY AT BEDTIME FOR REFLUX 02/08/17  Yes [provider]  Oxycodone HCl 10 MG TABS Limit 4-6 tablets po daily if tolerated Patient taking differently: Take 10 mg by mouth every 4 (four) hours.  05/15/16  Yes Mohammed Kindle, MD  pravastatin (PRAVACHOL) 20 MG tablet Take 20 mg by mouth at bedtime.   Yes [provider]  prazosin (MINIPRESS) 1 MG capsule Take 1-2 mg by mouth See admin instructions. Take 1 mg by mouth twice daily and 2 mg at bedtime   Yes [provider]  propranolol ER (INDERAL LA) 60 MG 24 hr capsule Take 60 mg by mouth daily.   Yes [provider]  QUEtiapine (SEROQUEL) 400 MG tablet Take 400 mg by mouth at bedtime. 01/22/17  Yes [provider]  tiZANidine (ZANAFLEX) 4 MG tablet Take 6-8 mg by mouth See admin instructions. Take 6 mg by mouth twice daily and 8 mg at bedtime 06/24/17  Yes [provider]  topiramate (TOPAMAX) 50 MG tablet Limit 1-2 tabs by mouth twice a day if tolerated Patient taking differently: Take 100 mg by mouth 2 (two) times daily.  04/15/16  Yes Mohammed Kindle, MD  triamcinolone (KENALOG) 0.025 % cream APPLY TO AFFECTED AREA TWICE DAILY AS NEEDED FOR ECZEMA 06/22/17  Yes [provider]  vitamin B-12 (CYANOCOBALAMIN) 1000 MCG tablet Take 1,000 mcg by mouth daily as needed (low b12).   Yes [provider]  zolpidem (AMBIEN) 10 MG tablet Take 10 mg by mouth once daily at bedtime 08/26/17  Yes [provider]      PHYSICAL EXAMINATION:  VITAL SIGNS: Blood pressure (!) 74/35, pulse 65, temperature (!) 97.1 F (36.2 C), temperature source Axillary, resp. rate 16, height 5\' 7"  (1.702 m), weight 90.7 kg (200 lb), last menstrual period 09/30/2017, SpO2 95 %.  GENERAL:  47 y.o.-year-old patient lying in the bed, lethargic.  EYES: Pupils are equal and reactive to light ;no scleral icterus.   HEENT: Head atraumatic, normocephalic. Oropharynx and nasopharynx clear.  NECK:  Supple, no jugular venous distention. No thyroid enlargement, no tenderness.  LUNGS: Reduced breath sounds bilaterally, no wheezing. No use of accessory muscles of respiration.  CARDIOVASCULAR: S1, S2 normal. No murmurs, rubs, or gallops.  ABDOMEN: Soft, nondistended, but tender with palpation in the lower mid abdominal area; no rebound no guarding. Bowel sounds present. No organomegaly or mass.  EXTREMITIES: No pedal edema, cyanosis, or clubbing.  NEUROLOGIC: She is moving all her extremities; gait not checked, as patient is too lethargic to ambulate.  PSYCHIATRIC: The patient is lethargic, confused.  SKIN: No obvious rash, lesion, or ulcer.   LABORATORY PANEL:   CBC Recent Labs  Lab 11/07/17 1728  11/08/17 0515  WBC 13.4* 13.7*  HGB 8.6* 9.8*  HCT 26.0* 29.7*  PLT 447* 578*  MCV 87.2 87.9  MCH 28.7 29.0  MCHC 32.9 33.0  RDW 13.7 13.8   ------------------------------------------------------------------------------------------------------------------  Chemistries  Recent Labs  Lab 11/07/17 1604 11/09/17 0327  NA 137 140  K 3.7 2.9*  CL 105 109  CO2 23 22  GLUCOSE 90 170*  BUN 8 12  CREATININE 0.88 1.22*  CALCIUM 8.8* 8.0*  AST 23 27  ALT 13* 17  ALKPHOS 89 95  BILITOT 0.7 0.7   ------------------------------------------------------------------------------------------------------------------ estimated creatinine clearance is 65.2 mL/min (A) (by C-G formula based on SCr of 1.22 mg/dL (H)). ------------------------------------------------------------------------------------------------------------------ No results for input(s): TSH, T4TOTAL, T3FREE, THYROIDAB in the last 72 hours.  Invalid input(s): FREET3   Coagulation profile Recent Labs  Lab 11/07/17 1728  INR 1.03   ------------------------------------------------------------------------------------------------------------------- No results for input(s): DDIMER in the last 72 hours. -------------------------------------------------------------------------------------------------------------------  Cardiac Enzymes No results for input(s): CKMB, TROPONINI, MYOGLOBIN in the last 168 hours.  Invalid input(s): CK ------------------------------------------------------------------------------------------------------------------ Invalid input(s): POCBNP  ---------------------------------------------------------------------------------------------------------------  Urinalysis No results found for: COLORURINE, APPEARANCEUR, LABSPEC, [place], GLUCOSEU, HGBUR, BILIRUBINUR, KETONESUR, PROTEINUR, UROBILINOGEN, NITRITE, LEUKOCYTESUR   RADIOLOGY: US Pelvis Transvanginal Non-ob (tv Only)  Result Date:  11/07/2017 CLINICAL DATA:  Bleeding status post hysterectomy and bilateral salpingo-oophorectomy on 10/26/2017. EXAM: TRANSABDOMINAL AND TRANSVAGINAL ULTRASOUND OF PELVIS TECHNIQUE: Both transabdominal and transvaginal ultrasound examinations of the pelvis were performed. Transabdominal technique was performed for global imaging of the pelvis including uterus, ovaries, adnexal regions, and pelvic cul-de-sac. It was necessary to proceed with endovaginal exam following the transabdominal exam to visualize the adnexal regions to an adequate degree. COMPARISON:  None FINDINGS: Status post hysterectomy and bilateral salpingo oophorectomy. Large complex fluid collection is seen within the midline pelvis extending into the bilateral adnexal regions, presumed blood products, active bleeding suspected. Sonographer describes active bleeding near the vaginal cuff. Largest component of the collection is measured at approximately 7 cm, although the overall collection is likely larger and difficult to measure due to its serpiginous configuration. IMPRESSION: Large complex fluid collection within the pelvis, extending into the bilateral adnexal regions, presumably blood products/hematoma, largest component measuring approximately 7 cm. Active bleeding identified by the sonographer near the vaginal cuff. These results were called by telephone at the time of interpretation on 11/07/2017 at 8:45 pm to Dr. Hassell Done DEFRANCESCO , who verbally  acknowledged these results. Electronically Signed   By: Franki Cabot M.D.   On: 11/07/2017 20:47   US Pelvis Complete  Result Date: 11/07/2017 CLINICAL DATA:  Bleeding status post hysterectomy and bilateral salpingo-oophorectomy on 10/26/2017. EXAM: TRANSABDOMINAL AND TRANSVAGINAL ULTRASOUND OF PELVIS TECHNIQUE: Both transabdominal and transvaginal ultrasound examinations of the pelvis were performed. Transabdominal technique was performed for global imaging of the pelvis including uterus,  ovaries, adnexal regions, and pelvic cul-de-sac. It was necessary to proceed with endovaginal exam following the transabdominal exam to visualize the adnexal regions to an adequate degree. COMPARISON:  None FINDINGS: Status post hysterectomy and bilateral salpingo oophorectomy. Large complex fluid collection is seen within the midline pelvis extending into the bilateral adnexal regions, presumed blood products, active bleeding suspected. Sonographer describes active bleeding near the vaginal cuff. Largest component of the collection is measured at approximately 7 cm, although the overall collection is likely larger and difficult to measure due to its serpiginous configuration. IMPRESSION: Large complex fluid collection within the pelvis, extending into the bilateral adnexal regions, presumably blood products/hematoma, largest component measuring approximately 7 cm. Active bleeding identified by the sonographer near the vaginal cuff. These results were called by telephone at the time of interpretation on 11/07/2017 at 8:45 pm to Dr. Hassell Done DEFRANCESCO , who verbally acknowledged these results. Electronically Signed   By: Franki Cabot M.D.   On: 11/07/2017 20:47    EKG: Orders placed or performed during the hospital encounter of 11/07/17  . EKG 12-Lead  . EKG 12-Lead    IMPRESSION AND PLAN:  1.  Hypotension, likely related to hypovolemia.  We will continue IV fluid resuscitation.  We will check CBC and BMP, troponin level and EKG to look for other causes.  We will check UA and chest x-ray.  Will transfer patient to intensive care unit for close monitoring.  Continue Narcan IV.  Hold blood pressure medications and avoid sedatives and pain killers that could lower the blood pressure.  Will change Seroquel from 400 mg nightly to 100 mg for now. 2.  Acute encephalopathy, likely related to sedative and pain medications, associated with hypovolemia from dehydration.  We will continue IV fluid resuscitation and  Narcan as needed. 3.  Status post intra-abdominal bleed, 2 days ago, status post laparoscopic clot evacuation.  Currently stable no active bleeding noted,  hemoglobin level was 8.8 after the procedure.  Will repeat H&H and continue IV fluids. 4.  Status post recent hysterectomy, on 10/26/17 for DUB, uterine fibroids and dysmenorrhea.  5.  History of hypertension, currently patient is hypotensive.  We will hold home blood pressure medications and continue to monitor blood pressure closely. 6.  Complex regional pain syndrome and degenerative disc disease, on chronic pain medications, muscle relaxants, antidepressive and antipsychotic medications.  We will have to adjust doses and hold some of these medications due to their potential for lowering the blood pressure.  Will use Toradol and fentanyl as needed for pain control, rather than Dilaudid and oxycodone.  Will hold Zanaflex, Ambien and Xanax.  All the records are reviewed and case discussed with Dr Enzo Bi and ICU team. Management plans discussed with the patient, family and they are in agreement.  CODE STATUS:    Code Status Orders  (From admission, onward)        Start     Ordered   11/07/17 1842  Full code  Continuous     11/07/17 1844    Code Status History    Date Active Date Inactive  Code Status Order ID Comments User Context   10/26/2017 16:46 10/29/2017 16:55 Full Code [id number]  Harlin Heys, MD Inpatient   07/10/2017 20:31 07/11/2017 19:31 Full Code [id number]  Epifanio Lesches, MD ED       TOTAL TIME TAKING CARE OF THIS PATIENT: 45 minutes.    Amelia Jo M.D on 11/09/2017 at 4:07 AM  Between 7am to 6pm - Pager - [phone]  After 6pm go to [email or web address] - password [email or web address] [facility]  Office  [phone]  CC: Primary care physician; [facility] [place]

## 2017-11-09 NOTE — Progress Notes (Signed)
Laurita Quint RN called Rojelio Brenner RN,AC to notify her of patient's continued need for narcan and concern about maintaining patient on the motherbaby unit given the intense level of care patient is requiring. Rojelio Brenner RN,AC states she will be up to patient's room in a few minutes.

## 2017-11-09 NOTE — Progress Notes (Signed)
[facility] [place] NAME: Jill Rivas    MR#:  [id number]  DATE OF BIRTH:  1971-09-09  SUBJECTIVE:  CHIEF COMPLAINT:   Chief Complaint  Patient presents with  . Post-op Problem  much better, no complaints REVIEW OF SYSTEMS:  Review of Systems  Constitutional: Negative for chills, fever and weight loss.  HENT: Negative for nosebleeds and sore throat.   Eyes: Negative for blurred vision.  Respiratory: Negative for cough, shortness of breath and wheezing.   Cardiovascular: Negative for chest pain, orthopnea, leg swelling and PND.  Gastrointestinal: Negative for abdominal pain, constipation, diarrhea, heartburn, nausea and vomiting.  Genitourinary: Negative for dysuria and urgency.  Musculoskeletal: Negative for back pain.  Skin: Negative for rash.  Neurological: Negative for dizziness, speech change, focal weakness and headaches.  Endo/Heme/Allergies: Does not bruise/bleed easily.  Psychiatric/Behavioral: Negative for depression.    DRUG ALLERGIES:   Allergies  Allergen Reactions  . Goat-Derived Products Swelling    Swelling and severe itching if near live goats  . Other Swelling    COCKROACHES cause throat and lip swelling.  . Adhesive [Tape] Rash    Paper tape causes a rash as well   VITALS:  Blood pressure 135/69, pulse 100, temperature 99.2 F (37.3 C), resp. rate 18, height 5\' 7"  (1.702 m), weight 90.7 kg (200 lb), last menstrual period 09/30/2017, SpO2 98 %. PHYSICAL EXAMINATION:  Physical Exam  Constitutional: She is oriented to person, place, and time and well-developed, well-nourished, and in no distress.  HENT:  Head: Normocephalic and atraumatic.  Eyes: Conjunctivae and EOM are normal. Pupils are equal, round, and reactive to light.  Neck: Normal range of motion. Neck supple. No tracheal deviation present. No thyromegaly present.  Cardiovascular: Normal rate, regular rhythm and normal heart sounds.    Pulmonary/Chest: Effort normal and breath sounds normal. No respiratory distress. She has no wheezes. She exhibits no tenderness.  Abdominal: Soft. Bowel sounds are normal. She exhibits no distension. There is no tenderness.  Musculoskeletal: Normal range of motion.  Neurological: She is alert and oriented to person, place, and time. No cranial nerve deficit.  Skin: Skin is warm and dry. No rash noted.  Psychiatric: Mood and affect normal.   LABORATORY PANEL:  Female CBC Recent Labs  Lab 11/09/17 0327  WBC 13.1*  HGB 7.1*  HCT 21.4*  PLT 391   ------------------------------------------------------------------------------------------------------------------ Chemistries  Recent Labs  Lab 11/09/17 0327  NA 140  K 2.9*  CL 109  CO2 22  GLUCOSE 170*  BUN 12  CREATININE 1.22*  CALCIUM 8.0*  MG 1.7  AST 27  ALT 17  ALKPHOS 95  BILITOT 0.7   RADIOLOGY:  Dg Chest 1 View  Result Date: 11/09/2017 CLINICAL DATA:  Acute encephalopathy EXAM: CHEST 1 VIEW COMPARISON:  July 10, 2017 FINDINGS: There is patchy airspace opacity in the medial left base. Lungs elsewhere are clear. Heart size and pulmonary vascularity are normal. No adenopathy. No bone lesions. IMPRESSION: Airspace opacity medial left base. Question pneumonia or aspiration. Lungs elsewhere clear. Heart size within normal limits. Electronically Signed   By: Lowella Grip III M.D.   On: 11/09/2017 07:35   ASSESSMENT AND PLAN:   1.  Hypotension, likely related to hypovolemia - continue IV fluid resuscitation. - Hold blood pressure medications and avoid sedatives and pain killers that could lower the blood pressure.  - changed Seroquel from 400 mg nightly to 100 mg for now.  2.  Acute  encephalopathy, likely related to sedative and pain medications, associated with hypovolemia from dehydration.  continue IV fluid resuscitation and Narcan as needed.  3.  Status post intra-abdominal bleed, 2 days ago, status post  laparoscopic clot evacuation.  Currently stable no active bleeding noted,  hemoglobin level was 8.8 after the procedure. now 7.1, may need transfusion if less than 7 tomorrow  4.  Status post recent hysterectomy, on 10/26/17 for DUB, uterine fibroids and dysmenorrhea. mgmt per OB  5.  History of hypertension, currently patient is hypotensive.  hold home blood pressure medications and continue to monitor blood pressure closely.  6.  Complex regional pain syndrome and degenerative disc disease, on chronic pain medications, muscle relaxants, antidepressive and antipsychotic medications.  We will have to adjust doses and hold some of these medications due to their potential for lowering the blood pressure.  - use Toradol and fentanyl as needed for pain control, rather than Dilaudid and oxycodone.  Will hold Zanaflex, Ambien and Xanax.       All the records are reviewed and case discussed with Care Management/Social Worker. Management plans discussed with the patient, nursing and they are in agreement.  CODE STATUS: Full Code  TOTAL TIME TAKING CARE OF THIS PATIENT: 15 minutes.   More than 50% of the time was spent in counseling/coordination of care: YES  POSSIBLE D/C IN 1-2 DAYS, DEPENDING ON CLINICAL CONDITION.   Max Sane M.D on 11/09/2017 at 8:43 PM  Between 7am to 6pm - Pager - [phone]  After 6pm go to [email or web address] - Technical brewer [facility]  Office  [phone]  CC: Primary care physician; [facility] [place]  Note: This dictation was prepared with Diplomatic Services operational officer dictation along with smaller phrase technology. Any transcriptional errors that result from this process are unintentional.

## 2017-11-09 NOTE — Consult Note (Signed)
PULMONARY / CRITICAL CARE MEDICINE   Name: Jill Rivas MRN: [id number] DOB: 09-30-1970    ADMISSION DATE:  11/07/2017   CONSULTATION DATE:  11/09/2017  REFERRING MD:  Dr. Enzo Bi  REASON: hypovolemic shock  HISTORY OF PRESENT ILLNESS:   This is a 47 y/o female with a PMH as indicated below who is transferred to the ICU for refractory shock. Briefly, patient was admitted on 10/26/2017 for Dysfunctional uterine bleeding dysmenorrhea uterine fibroids  and had a hysterectomy and bilateral salpingo-oophorectomy. Post-op, she started bleeding and lost 1L of blood while in the PCAU. She was taken back to the OR on the same day and a vaginal cuff repair was done. She also had a lot of nausea and vomiting during hospitalization but it subsequently resolved and she had a normal hospital course and was discharged home on 10/29/2017. On 2/16, patient returned to the ER with complaints of bright red vaginal bleeding x 1 day, lightheadedness, and weakness. Her H/H at the ED was 8.6/26 down from 13.1/39.2 on 10/22/2017. On 2/17, she was taken to the OR for an exploratory laparoscopy and was found to have a large complex fluid collection within the pelvis extending to the bilateral adnexal regions and confirmed bleeding near vaginal cuff. About 300cc of blood was removed and patient was doing well post-procedure until 2/17 at 2300 when she became hypotensive after taking her pain medications. Patient received  Tylenol 1 gram, clonazepam 0.5 mg, dilaudid 1mg , motrin 800mg , phenergan 25 mg, seroquel 400mg , zenaflex 8mg , and ambien 5mg  between 2200 and 2300. After her medications, she became obtunded and hypotensive. She has received a total of 4 doses of narcan and 750cc bolus of IV fluids. She is being transferred to the ICU for further management STAT labs show a H/H of 7.1/21.4 down from 9.8/29.7 from 2/17 morning. Platelets and coags are normal. She is hypokalemic with a k+ level of 2.9 and an albumin  level of   PAST MEDICAL HISTORY :  She  has a past medical history of Anxiety, Bilateral occipital neuralgia, Compression fracture of cervical vertebra ([place]) (2018), CRPS (complex regional pain syndrome type I) (2019), CRPS (complex regional pain syndrome), DDD (degenerative disc disease), thoracolumbar (2019), Depression, Dysrhythmia, GERD (gastroesophageal reflux disease), History of kidney stones, Hypertension (2019), Kidney stones, Migraines (2019), PONV (postoperative nausea and vomiting), PTSD (post-traumatic stress disorder), Reflex sympathetic dystrophy, Reflex sympathetic dystrophy of lower limb, and Sleep apnea (2017).  PAST SURGICAL HISTORY: She  has a past surgical history that includes Cholecystectomy; Foot surgery (Left, 1993); Kidney surgery (Left, 2005); Breast surgery; Breast enhancement surgery (Bilateral); Augmentation mammaplasty (Bilateral, 2005); Repair vaginal cuff (N/A, 10/26/2017); and Abdominal hysterectomy.  Allergies  Allergen Reactions  . Goat-Derived Products Swelling    Swelling and severe itching if near live goats  . Other Swelling    COCKROACHES cause throat and lip swelling.  . Adhesive [Tape] Rash    Paper tape causes a rash as well    No current facility-administered medications on file prior to encounter.    Current Outpatient Medications on File Prior to Encounter  Medication Sig  . cetirizine (ZYRTEC) 10 MG tablet Take 10 mg by mouth at bedtime.   . clonazePAM (KLONOPIN) 0.5 MG tablet Take 0.5 mg by mouth 2 (two) times daily.  . diclofenac sodium (VOLTAREN) 1 % GEL Apply 2-4 g to painful area of skin 4 times per day if tolerated. Patient with history of strain, sprain, and muscle spasms of extremities  . DULoxetine (CYMBALTA)  30 MG capsule Take 30-60 mg by mouth See admin instructions. Take 60 mg by mouth in the morning and 30 mg by mouth at bedtime.  Marland Kitchen estradiol (ESTRACE) 1 MG tablet Take 1.5 tablets (1.5 mg total) by mouth daily.  Marland Kitchen ibuprofen  (ADVIL,MOTRIN) 200 MG tablet Take 800 mg by mouth daily as needed for moderate pain.  Marland Kitchen omeprazole (PRILOSEC) 40 MG capsule TAKE 1 TABLET BY MOUTH DAILY AT BEDTIME FOR REFLUX  . Oxycodone HCl 10 MG TABS Limit 4-6 tablets po daily if tolerated (Patient taking differently: Take 10 mg by mouth every 4 (four) hours. )  . pravastatin (PRAVACHOL) 20 MG tablet Take 20 mg by mouth at bedtime.  . prazosin (MINIPRESS) 1 MG capsule Take 1-2 mg by mouth See admin instructions. Take 1 mg by mouth twice daily and 2 mg at bedtime  . propranolol ER (INDERAL LA) 60 MG 24 hr capsule Take 60 mg by mouth daily.  . QUEtiapine (SEROQUEL) 400 MG tablet Take 400 mg by mouth at bedtime.  Marland Kitchen tiZANidine (ZANAFLEX) 4 MG tablet Take 6-8 mg by mouth See admin instructions. Take 6 mg by mouth twice daily and 8 mg at bedtime  . topiramate (TOPAMAX) 50 MG tablet Limit 1-2 tabs by mouth twice a day if tolerated (Patient taking differently: Take 100 mg by mouth 2 (two) times daily. )  . triamcinolone (KENALOG) 0.025 % cream APPLY TO AFFECTED AREA TWICE DAILY AS NEEDED FOR ECZEMA  . vitamin B-12 (CYANOCOBALAMIN) 1000 MCG tablet Take 1,000 mcg by mouth daily as needed (low b12).  . zolpidem (AMBIEN) 10 MG tablet Take 10 mg by mouth once daily at bedtime    FAMILY HISTORY:  Her indicated that her mother is deceased. She indicated that her father is deceased.   SOCIAL HISTORY: She  reports that  has never smoked. she has never used smokeless tobacco. She reports that she does not drink alcohol or use drugs.  REVIEW OF SYSTEMS:   Unable to obtain as patient is somnolent  SUBJECTIVE:   VITAL SIGNS: BP (!) 74/35 (BP Location: Left Arm)   Pulse 65   Temp (!) 97.1 F (36.2 C) (Axillary)   Resp 16   Ht 5\' 7"  (1.702 m)   Wt 200 lb (90.7 kg)   LMP 09/30/2017 (Approximate)   SpO2 95%   BMI 31.32 kg/m   HEMODYNAMICS:    VENTILATOR SETTINGS:    INTAKE / OUTPUT: I/O last 3 completed shifts: In: 3216.7 [I.V.:3216.7] Out:  2475 [Urine:1775; Emesis/NG output:500; Blood:200]  PHYSICAL EXAMINATION: General: Appears acutely ill Neuro: Somnolent, but awakens to voice and touch, alert to person place, moves all extremities, follows basic commands HEENT: PERRLA trachea midline Cardiovascular: RRR, S1-S2, no murmur regurg or gallop, bilateral pulses equal, no edema Lungs: Clear to auscultation bilaterally Abdomen: Abdomen with lower abdominal incision in 3 right left and upper quadrant small incisions healing well Musculoskeletal: No deformities, unable to assess gait Skin: Pale, cold to touch  LABS:  BMET Recent Labs  Lab 11/07/17 1604 11/09/17 0327  NA 137 140  K 3.7 2.9*  CL 105 109  CO2 23 22  BUN 8 12  CREATININE 0.88 1.22*  GLUCOSE 90 170*    Electrolytes Recent Labs  Lab 11/07/17 1604 11/09/17 0327  CALCIUM 8.8* 8.0*    CBC Recent Labs  Lab 11/07/17 1728 11/08/17 0515 11/09/17 0327  WBC 13.4* 13.7* 13.1*  HGB 8.6* 9.8* 7.1*  HCT 26.0* 29.7* 21.4*  PLT 447* 578* 391  Coag's Recent Labs  Lab 11/07/17 1728  APTT 33  INR 1.03    Sepsis Markers No results for input(s): LATICACIDVEN, PROCALCITON, O2SATVEN in the last 168 hours.  ABG Recent Labs  Lab 11/09/17 0424  PHART 7.49*  PCO2ART 33  PO2ART 81*    Liver Enzymes Recent Labs  Lab 11/07/17 1604 11/09/17 0327  AST 23 27  ALT 13* 17  ALKPHOS 89 95  BILITOT 0.7 0.7  ALBUMIN 4.0 2.9*    Cardiac Enzymes Recent Labs  Lab 11/09/17 0327  TROPONINI <0.03    Glucose Recent Labs  Lab 11/09/17 0421  GLUCAP 145*    Imaging No results found.   CULTURES: Blood cultures x2 Urine culture  ANTIBIOTICS: Zosyn prophylactically possible intra-abdominal infection  SIGNIFICANT EVENTS: 10/29/2017: Discharge 0216 2019 readmitted  LINES/TUBES: Foley catheter Peripheral IVs DISCUSSION: 47 year old female presenting with postoperative vaginal bleeding status post hysterectomy; now in refractory  hypovolemic shock.  ASSESSMENT Hypovolemic shock-likely secondary to acute blood loss and poor oral intake, nausea and vomiting Acute blood loss anemia-hemoglobin trending down Abnormal uterine bleeding now status post hysterectomy Chronic pain syndrome with multiple outpatient pain medications History of depression, PTSD, limb dystrophy GERD and occipital neuralgia  PLAN Hemodynamics per ICU protocol Volume resuscitation with blood products and IV fluid boluses Low-dose pressors to maintain mean arterial blood pressure greater than 65 if no improvement in blood pressure with blood products and fluid boluses Monitor intake and output Foley catheter Zofran and Phenergan for nausea and vomiting Trend CBC and transfuse to maintain hemoglobin greater than 7 OB/GYN surgery following up Hold all iv opioid pain medications IV Tylenol as needed and scheduled oxycodone Address polypharmacy prior to discharge GI and DVT prophylaxis-no pharmacologic DVT prophylaxis; SCDs and Protonix  FAMILY  - Updates: Patient's sister updated at bedside Magdalene S[facility] ANP-BC Pulmonary and Critical Care Medicine [facility] Pager [phone] or [phone]  NB: This document was prepared using Dragon voice recognition software and may include unintentional dictation errors.   11/09/2017, 4:52 AM

## 2017-11-09 NOTE — Progress Notes (Signed)
ABG RESULTS NOTIFIED [place]  NP

## 2017-11-09 NOTE — Progress Notes (Signed)
   11/09/17 1120  Clinical Encounter Type  Visited With Patient and family together  Visit Type Follow-up  Spiritual Encounters  Spiritual Needs Emotional;Prayer   Based on report in team meeting, chaplain met with patient and sister.  Per sister request, chaplain read prayer sent by their pastor.  Chaplain facilitated conversation around healing and wellness.  Patient and sister shared thoughts regarding how God is moving in their lives at this time; chaplain explored how to support them in appreciating and exploring this connection.  Chaplain offered prayer and will continue to follow this family.

## 2017-11-09 NOTE — Progress Notes (Signed)
Rapid Response Event Note  Overview: RR called to 348 upon arrival pt was alert and oriented . Prior to arrival pt was lethargic and hypotensive.      Initial Focused Assessment:pt was hypotensive 78/44(52). Pt was receiving a bolus BP 88/69(73). Care nurse was on the phone with MD obtaining orders  Interventions: pt was given Narcan , given a fluid bolus / medications adjusted  Plan of Care (if not transferred): Remain on the unit and monitor BP Event Summary:   at  0035    at          [facility]

## 2017-11-09 NOTE — Progress Notes (Signed)
Report called to ICU nurse [facility] RN

## 2017-11-09 NOTE — Progress Notes (Signed)
GYN PROGRESS NOTE: Day 1-ICU/CCU admission Subjective: Patient much more alert and oriented with good cognition.  Has been started on a regular diet.  Patient is now getting second unit of packed red blood cells for hemoglobin of 7.  Potassium supplementation has been ongoing for low K level of 2.9.  Objective: BP 122/70   Pulse 99   Temp 98.6 F (37 C) (Oral)   Resp 18   Ht 5\' 7"  (1.702 m)   Wt 200 lb (90.7 kg)   LMP 09/30/2017 (Approximate)   SpO2 99%   BMI 31.32 kg/m  Alert and oriented.  No acute distress. Abdomen: Soft, no peritoneal signs; laparoscopy incision sites are covered with Dermabond and healthy in appearance; laparotomy incision is well approximated Extremities: Warm and dry  ASSESSMENT: 1.  Market improvement in status following blood transfusion and correction of potassium. 2.  Polypharmacy has complicated patient's postoperative course and analgesics are to be limited at this time. 3.  Appreciate hospitalist and intensivist consultations and interventions.  PLAN: 1.  Patient is advanced to regular diet 2.  We will continue to come follow postop course.  Brayton Mars, MD  Note: This dictation was prepared with Dragon dictation along with smaller phrase technology. Any transcriptional errors that result from this process are unintentional.

## 2017-11-09 NOTE — Progress Notes (Signed)
PROGRESS NOTE-GYN: Summary note of hypotension episode: Called by nursing staff to be aware of patient's drop in blood pressure to 10U systolic.  This occurred around midnight.  The patient had recently received an Ambien.  Patient is also on clonazepam, and has been getting Dilaudid 2mg  IV every 3 hours for postop pain relief.  She also is on tizanidine.  Patient is status post laparoscopic adhesiolysis and evacuation of pelvic hematoma following a hysterectomy which was performed on 10/26/2017.  Intraoperative findings included 300 mL of organized clot without active bleeding.  No significant blood loss was encountered during surgery.  The patient's preoperative hemoglobin was 9.8. Postoperative course was notable for nausea and vomiting for which she underwent n.p.o. status.  IV fluids were maintained at 125 cc/h.  She has been receiving Zofran and Phenergan IV. Significant comorbidities include complex regional pain syndrome involving the left lower extremity as well as degenerative disc disease and chronic pain for which she is on muscle relaxants, and chronic narcotics.  During the initial episode of hypotension, the patient maintained a normal heart rate as well as normal pulse ox.  She received 3 rounds of IV Narcan 0.4 mg with initial improvement in mental status and blood pressure.  However after each dose of Narcan her blood pressure will trend lower.  O2 sats never dropped below the mid 90s.  Heart rate never became tachycardic.  The patient is intermittently arousable. Patient does not have any history of cardiac disease.  She does not have any history of diabetes mellitus.  The patient received an initial bolus of 250 ml LR; CBC, CMP, and urine drug screen have been ordered.  Foley catheter has been inserted.  Hospitalist consult has been obtained.  I have discussed the case with the hospitalist. The patient's Dilaudid, Phenergan, and tizanidine have been discontinued at this time.  For pain  relief she will be receiving Tylenol and Toradol. Following the hospitalist consult, the patient will be transferred to the CCU for closer observation overnight.  In the meantime she is getting an additional 500 cc bolus of normal saline at this time.  Physical exam: BP (!) 74/35 (BP Location: Left Arm)   Pulse 65   Temp (!) 97.1 F (36.2 C) (Axillary)   Resp 16   Ht 5\' 7"  (1.702 m)   Wt 200 lb (90.7 kg)   LMP 09/30/2017 (Approximate)   SpO2 95%   BMI 31.32 kg/m  Urine output last shift 250 mL Pleasant female in no acute distress she is somnolent.  She is arousable.  She is oriented to place and time. Lungs: Clear Heart rate: Regular rate and rhythm without murmur Abdomen: Soft, nontender Extremities: Warm and dry without significant edema.  Good capillary refill.  Assessment: 1.  Status post TAH/BSO on 03/23/5365 complicated by vaginal bleeding requiring additional suturing of vagina in the PACU. 2.  History 2 days ago of bright red vaginal bleeding (11/07/2017); ER evaluation notable for hemoglobin of 8.8 and ultrasound demonstrating complex fluid collection in the pelvis consistent with possible intra-abdominal bleed. 3.  Status post laparoscopy with adhesio lysis and evacuation of pelvic hematoma 24 hours ago; 300 ml of organized clot was removed without any significant blood loss at the time of surgery. 4.  History of complex regional pain syndrome, degenerative disc disease, on multiple medications with depressive effects including clonazepam, Ambien, narcotics, tizanidine.  Suspected multidrug interaction causing hypotension without tachycardia, without hypoxemia.  Status post Narcan x3 with intermittent improvement in symptoms except  for persistent hypotension 5.  Postoperative nausea with vomiting, possible volume depletion.  PLAN: 1.  Hospitalist consult 2.  Transfer to CCU for closer observation 3.  CBC, CMP, urine drug screen, EKG 4.  Foley catheter  Brayton Mars,  MD

## 2017-11-09 NOTE — Progress Notes (Signed)
eLink Physician-Brief Progress Note Patient Name: Jill Rivas DOB: 08-Aug-1971 MRN: [id number]   Date of Service  11/09/2017  HPI/Events of Note  47 yo female s/p Hysterectomy 2 weeks ago. Developed vaginal bleeding and underwent laproscopic procedure to evacuate blood. Now lethargic and hypotensive. Getting a lot of pain medications for chronic pain + recent surgery. Being moved to ICU. PCCM asked to assume care. BP = 77/44. Sat = 98% and RR = 15. ABG on room air = 7.49/33/81/25.1. Last Hgb = 7.1. Has orders for 2 units PRBC to be transfused.   eICU Interventions  Will order: 1. Bolus with 0.9 NaCl 1 liter IV over 1 hour now.      Intervention Category Evaluation Type: New Patient Evaluation  Lysle Dingwall 11/09/2017, 4:51 AM

## 2017-11-09 NOTE — Progress Notes (Signed)
   11/09/17 1500  Clinical Encounter Type  Visited With Patient and family together  Visit Type Follow-up  Spiritual Encounters  Spiritual Needs Emotional   Chaplain met with patient and family (brother and sister-in-law) to follow up.  Patient shared thoughts and feelings around current health condition.  Chaplain offered emotional support, utilized active listening.

## 2017-11-09 NOTE — Progress Notes (Signed)
Pharmacy Antibiotic Note  Jill Rivas is a 47 y.o. female admitted on 11/07/2017 with sepsis.  Pharmacy has been consulted for zosyn dosing.  Plan: Zosyn 3.375g IV q8h (4 hour infusion).  Height: 5\' 7"  (170.2 cm) Weight: 200 lb (90.7 kg) IBW/kg (Calculated) : 61.6  Temp (24hrs), Avg:97.7 F (36.5 C), Min:96.8 F (36 C), Max:98.2 F (36.8 C)  Recent Labs  Lab 11/07/17 1604 11/07/17 1728 11/08/17 0515 11/09/17 0327  WBC  --  13.4* 13.7* 13.1*  CREATININE 0.88  --   --  1.22*    Estimated Creatinine Clearance: 65.2 mL/min (A) (by C-G formula based on SCr of 1.22 mg/dL (H)).    Allergies  Allergen Reactions  . Goat-Derived Products Swelling    Swelling and severe itching if near live goats  . Other Swelling    COCKROACHES cause throat and lip swelling.  . Adhesive [Tape] Rash    Paper tape causes a rash as well    Thank you for allowing pharmacy to be a part of this patient's care.  Tobie Lords, PharmD, BCPS Clinical Pharmacist 11/09/2017

## 2017-11-10 ENCOUNTER — Encounter: Payer: Self-pay | Admitting: Radiology

## 2017-11-10 ENCOUNTER — Inpatient Hospital Stay: Payer: Medicare Other

## 2017-11-10 DIAGNOSIS — G894 Chronic pain syndrome: Secondary | ICD-10-CM

## 2017-11-10 DIAGNOSIS — D62 Acute posthemorrhagic anemia: Secondary | ICD-10-CM

## 2017-11-10 LAB — BASIC METABOLIC PANEL
Anion gap: 10 (ref 5–15)
BUN: 8 mg/dL (ref 6–20)
CHLORIDE: 107 mmol/L (ref 101–111)
CO2: 21 mmol/L — AB (ref 22–32)
Calcium: 7.8 mg/dL — ABNORMAL LOW (ref 8.9–10.3)
Creatinine, Ser: 0.75 mg/dL (ref 0.44–1.00)
GFR calc Af Amer: >60 mL/min (ref >60)
GFR calc non Af Amer: >60 mL/min (ref >60)
Glucose, Bld: 152 mg/dL — ABNORMAL HIGH (ref 65–99)
Potassium: 2.8 mmol/L — ABNORMAL LOW (ref 3.5–5.1)
Sodium: 138 mmol/L (ref 135–145)

## 2017-11-10 LAB — TYPE AND SCREEN
ABO/RH(D): B POS
ANTIBODY SCREEN: NEGATIVE
UNIT DIVISION: 0
Unit division: 0

## 2017-11-10 LAB — URINALYSIS, COMPLETE (UACMP) WITH MICROSCOPIC
BILIRUBIN URINE: NEGATIVE
Glucose, UA: NEGATIVE mg/dL
Hgb urine dipstick: NEGATIVE
KETONES UR: NEGATIVE mg/dL
Nitrite: NEGATIVE
PH: 6 (ref 5.0–8.0)
PROTEIN: 30 mg/dL — AB
Specific Gravity, Urine: 1.016 (ref 1.005–1.030)

## 2017-11-10 LAB — BPAM RBC
BLOOD PRODUCT EXPIRATION DATE: 201903172359
Blood Product Expiration Date: 201903042359
ISSUE DATE / TIME: 201902181045
ISSUE DATE / TIME: 201902180513
UNIT TYPE AND RH: 1700
UNIT TYPE AND RH: 7300

## 2017-11-10 LAB — CBC WITH DIFFERENTIAL/PLATELET
Basophils Absolute: 0.2 10*3/uL — ABNORMAL HIGH (ref 0–0.1)
Basophils Relative: 1 %
EOS ABS: 0 10*3/uL (ref 0–0.7)
EOS PCT: 0 %
HCT: 26 % — ABNORMAL LOW (ref 35.0–47.0)
Hemoglobin: 8.7 g/dL — ABNORMAL LOW (ref 12.0–16.0)
LYMPHS ABS: 0.6 10*3/uL — AB (ref 1.0–3.6)
Lymphocytes Relative: 4 %
MCH: 28.4 pg (ref 26.0–34.0)
MCHC: 33.3 g/dL (ref 32.0–36.0)
MCV: 85.4 fL (ref 80.0–100.0)
MONO ABS: 0.7 10*3/uL (ref 0.2–0.9)
MONOS PCT: 5 %
Neutro Abs: 12.6 10*3/uL — ABNORMAL HIGH (ref 1.4–6.5)
Neutrophils Relative %: 90 %
PLATELETS: 341 10*3/uL (ref 150–440)
RBC: 3.05 MIL/uL — ABNORMAL LOW (ref 3.80–5.20)
RDW: 13.8 % (ref 11.5–14.5)
WBC: 14.1 10*3/uL — ABNORMAL HIGH (ref 3.6–11.0)

## 2017-11-10 LAB — MAGNESIUM: MAGNESIUM: 1.6 mg/dL — AB (ref 1.7–2.4)

## 2017-11-10 MED ORDER — POTASSIUM CHLORIDE CRYS ER 20 MEQ PO TBCR
40.0000 meq | EXTENDED_RELEASE_TABLET | Freq: Three times a day (TID) | ORAL | Status: AC
  Administered 2017-11-10 (×3): 40 meq via ORAL
  Filled 2017-11-10 (×3): qty 2

## 2017-11-10 MED ORDER — MAGNESIUM SULFATE 2 GM/50ML IV SOLN
2.0000 g | Freq: Once | INTRAVENOUS | Status: AC
  Administered 2017-11-10: 2 g via INTRAVENOUS
  Filled 2017-11-10: qty 50

## 2017-11-10 MED ORDER — DULOXETINE HCL 30 MG PO CPEP
60.0000 mg | ORAL_CAPSULE | Freq: Every day | ORAL | Status: DC
  Administered 2017-11-10 – 2017-11-12 (×3): 60 mg via ORAL
  Filled 2017-11-10 (×3): qty 2

## 2017-11-10 MED ORDER — CLONAZEPAM 0.5 MG PO TABS: 0.5000 mg | ORAL_TABLET | Freq: Two times a day (BID) | ORAL | Status: DC

## 2017-11-10 MED ORDER — DULOXETINE HCL 30 MG PO CPEP
30.0000 mg | ORAL_CAPSULE | Freq: Every day | ORAL | Status: DC
  Administered 2017-11-10 – 2017-11-11 (×2): 30 mg via ORAL
  Filled 2017-11-10 (×2): qty 1

## 2017-11-10 MED ORDER — POTASSIUM CHLORIDE 10 MEQ/100ML IV SOLN
10.0000 meq | INTRAVENOUS | Status: DC
  Filled 2017-11-10 (×5): qty 100

## 2017-11-10 MED ORDER — IOPAMIDOL (ISOVUE-370) INJECTION 76%
100.0000 mL | Freq: Once | INTRAVENOUS | Status: AC | PRN
  Administered 2017-11-10: 100 mL via INTRAVENOUS

## 2017-11-10 NOTE — Progress Notes (Signed)
   11/10/17 1110  Clinical Encounter Type  Visited With Patient not available;Family  Visit Type Follow-up  Spiritual Encounters  Spiritual Needs Emotional   Chaplain attempted visit with patient and family but they were in an extended conversation with another staff member.  Chaplain did meet with sister, niece, and family friend, offering emotional support and using active listening.  Chaplain will continue to attempt to connect with patient.

## 2017-11-10 NOTE — Progress Notes (Signed)
PT Cancellation Note  Patient Details Name: Jill Rivas MRN: [id number] DOB: 18-Apr-1971   Cancelled Treatment:    Reason Eval/Treat Not Completed: Medical issues which prohibited therapy; Pt's Ka currently 2.8 and trending downward with current level falling below guidelines for participation with PT services.  Will attempt to see pt at a future date/time as medically appropriate.     Heloise Beecham Terease Marcotte PT, DPT 11/10/17, 1:21 PM

## 2017-11-10 NOTE — Progress Notes (Signed)
GYN-progress note: Hospital day 3 ICU day 2  Status post TAH/BSO 10/26/2017 Status post laparoscopic adhesiolysis and evacuation of pelvic hematoma 11/08/2017 Status post transfer to ICU 11/09/2017 for hypotension related to hypovolemia, postoperative anemia, and polypharmacy effect on respiratory status and blood pressure Status post 2 units of packed red blood cells Status post K replacement, without significant bump in potassium level  Subjective: Patient is feeling miserable this morning as she aches all over and has generalized abdominal discomfort.  She did have emesis x1 last night with 300 mL fluid loss.  She is off majority of her medications for chronic pain syndrome and is getting Toradol and fentanyl PCA for pain relief; she is reluctant to press the PCA button because of fear of respiratory depression and possible need for Narcan (patient with bad experience with Narcan administration previously)  Objective:BP 125/66   Pulse 100   Temp 99.5 F (37.5 C) (Oral)   Resp 17   Ht 5\' 7"  (1.702 m)   Wt 200 lb (90.7 kg)   LMP 09/30/2017 (Approximate)   SpO2 97%   BMI 31.32 kg/m  Alert and oriented; anxious Abdomen: Soft, mild generalized tenderness without peritoneal signs; laparoscopy and Pfannenstiel skin incisions are healing well Extremities: Warm and dry  CBC Latest Ref Rng & Units 11/10/2017 11/09/2017 11/08/2017  WBC 3.6 - 11.0 K/uL 14.1(H) 13.1(H) 13.7(H)  Hemoglobin 12.0 - 16.0 g/dL 8.7(L) 7.1(L) 9.8(L)  Hematocrit 35.0 - 47.0 % 26.0(L) 21.4(L) 29.7(L)  Platelets 150 - 440 K/uL 341 391 578(H)   CMP Latest Ref Rng & Units 11/10/2017 11/09/2017 11/07/2017  Glucose 65 - 99 mg/dL 152(H) 170(H) 90  BUN 6 - 20 mg/dL 8 12 8   Creatinine 0.44 - 1.00 mg/dL 0.75 1.22(H) 0.88  Sodium 135 - 145 mmol/L 138 140 137  Potassium 3.5 - 5.1 mmol/L 2.8(L) 2.9(L) 3.7  Chloride 101 - 111 mmol/L 107 109 105  CO2 22 - 32 mmol/L 21(L) 22 23  Calcium 8.9 - 10.3 mg/dL 7.8(L) 8.0(L) 8.8(L)  Total  Protein 6.5 - 8.1 g/dL - 6.1(L) 7.8  Total Bilirubin 0.3 - 1.2 mg/dL - 0.7 0.7  Alkaline Phos 38 - 126 U/L - 95 89  AST 15 - 41 U/L - 27 23  ALT 14 - 54 U/L - 17 13(L)   Assessment: 1.  Status post 2 unit packed red blood cell transfusion with appropriate response in hemoglobin bump 2.  Hypokalemia, not improved with potassium replacement at this time 3.  Generalized malaise and total body soreness, irritable; concern about drug withdrawal from maintenance medications (benzodiazepine, muscle relaxants, analgesics, anxiolytics, etc.) 4.  Uncertain as to preparedness of patient for transfer to stepdown unit at this time.  Plan: 1.  Continue advancing diet as tolerated 2.  Increase activity as tolerated 3.  Likely needs continued potassium supplementation to correct low potassium 4.  Polypharmacy needs for chronic pain syndrome need addressed considering some possible withdrawal symptoms without maintenance meds on board. 5.  Appreciate and request continued hospitalist co management  Brayton Mars, MD

## 2017-11-10 NOTE — Progress Notes (Signed)
Pain is better controlled Denies dyspnea No new complaints Dr. Enzo Bi wishes that she remain in SDU through today  Vitals:   11/10/17 1100 11/10/17 1200 11/10/17 1208 11/10/17 1300  BP: 133/82 130/78    Pulse: (!) 102 (!) 107  96  Resp: 16 16 19 13   Temp:  98.6 F (37 C)    TempSrc:  Oral    SpO2: 99% 99% 100% 100%  Weight:      Height:        RA  Gen: WDWN in NAD HEENT: NCAT, sclerae white, oropharynx normal Neck: No LAN, no JVD noted Lungs: full BS, no adventitious sounds Cardiovascular: Regular, normal rate, no M noted Abdomen: Soft, mildly diffusely tender, +BS Ext: no C/C/E Neuro: PERRL, EOMI, motor/sensory grossly intact Skin: No lesions noted   BMP Latest Ref Rng & Units 11/10/2017 11/09/2017 11/07/2017  Glucose 65 - 99 mg/dL 152(H) 170(H) 90  BUN 6 - 20 mg/dL 8 12 8   Creatinine 0.44 - 1.00 mg/dL 0.75 1.22(H) 0.88  Sodium 135 - 145 mmol/L 138 140 137  Potassium 3.5 - 5.1 mmol/L 2.8(L) 2.9(L) 3.7  Chloride 101 - 111 mmol/L 107 109 105  CO2 22 - 32 mmol/L 21(L) 22 23  Calcium 8.9 - 10.3 mg/dL 7.8(L) 8.0(L) 8.8(L)     CBC Latest Ref Rng & Units 11/10/2017 11/09/2017 11/08/2017  WBC 3.6 - 11.0 K/uL 14.1(H) 13.1(H) 13.7(H)  Hemoglobin 12.0 - 16.0 g/dL 8.7(L) 7.1(L) 9.8(L)  Hematocrit 35.0 - 47.0 % 26.0(L) 21.4(L) 29.7(L)  Platelets 150 - 440 K/uL 341 391 578(H)     No new CXR  IMP: Hemorrhagic shock, resolved Acute blood loss anemia Acute pelvic/abdominal pain due to intra-abdominal bleeding Chronic pain syndrome Current hypokalemia Hypomagnesemia  PLAN/REC: We will keep in SDU through today Monitor BMET intermittently Monitor I/Os Correct electrolytes as indicated  Continue PCA fentanyl   Merton Border, MD PCCM service Mobile [phone] Pager [phone] 11/10/2017 3:21 PM

## 2017-11-10 NOTE — Progress Notes (Signed)
[facility] [place] NAME: Jill Rivas    MR#:  [id number]  DATE OF BIRTH:  [birth year]  SUBJECTIVE:  CHIEF COMPLAINT:   Chief Complaint  Patient presents with  . Post-op Problem  about same, Hb 8.7 REVIEW OF SYSTEMS:  Review of Systems  Constitutional: Negative for chills, fever and weight loss.  HENT: Negative for nosebleeds and sore throat.   Eyes: Negative for blurred vision.  Respiratory: Negative for cough, shortness of breath and wheezing.   Cardiovascular: Negative for chest pain, orthopnea, leg swelling and PND.  Gastrointestinal: Negative for abdominal pain, constipation, diarrhea, heartburn, nausea and vomiting.  Genitourinary: Negative for dysuria and urgency.  Musculoskeletal: Negative for back pain.  Skin: Negative for rash.  Neurological: Negative for dizziness, speech change, focal weakness and headaches.  Endo/Heme/Allergies: Does not bruise/bleed easily.  Psychiatric/Behavioral: Negative for depression.    DRUG ALLERGIES:   Allergies  Allergen Reactions  . Goat-Derived Products Swelling    Swelling and severe itching if near live goats  . Other Swelling    COCKROACHES cause throat and lip swelling.  . Adhesive [Tape] Rash    Paper tape causes a rash as well   VITALS:  Blood pressure 140/88, pulse 83, temperature 99.7 F (37.6 C), temperature source Oral, resp. rate 13, height 5\' 7"  (1.702 m), weight 90.7 kg (200 lb), last menstrual period 09/30/2017, SpO2 97 %. PHYSICAL EXAMINATION:  Physical Exam  Constitutional: She is oriented to person, place, and time and well-developed, well-nourished, and in no distress.  HENT:  Head: Normocephalic and atraumatic.  Eyes: Conjunctivae and EOM are normal. Pupils are equal, round, and reactive to light.  Neck: Normal range of motion. Neck supple. No tracheal deviation present. No thyromegaly present.  Cardiovascular: Normal rate, regular rhythm and normal heart  sounds.  Pulmonary/Chest: Effort normal and breath sounds normal. No respiratory distress. She has no wheezes. She exhibits no tenderness.  Abdominal: Soft. Bowel sounds are normal. She exhibits no distension. There is no tenderness.  Musculoskeletal: Normal range of motion.  Neurological: She is alert and oriented to person, place, and time. No cranial nerve deficit.  Skin: Skin is warm and dry. No rash noted.  Psychiatric: Mood and affect normal.   LABORATORY PANEL:  Female CBC Recent Labs  Lab 11/10/17 0524  WBC 14.1*  HGB 8.7*  HCT 26.0*  PLT 341   ------------------------------------------------------------------------------------------------------------------ Chemistries  Recent Labs  Lab 11/09/17 0327 11/10/17 0524  NA 140 138  K 2.9* 2.8*  CL 109 107  CO2 22 21*  GLUCOSE 170* 152*  BUN 12 8  CREATININE 1.22* 0.75  CALCIUM 8.0* 7.8*  MG 1.7 1.6*  AST 27  --   ALT 17  --   ALKPHOS 95  --   BILITOT 0.7  --    RADIOLOGY:  Ct Angio Abd/pel W/ And/or W/o  Result Date: 11/10/2017 CLINICAL DATA:  Status post hysterectomy and postsurgical hematoma evacuation. Rule out bleeding. EXAM: CTA ABDOMEN AND PELVIS wITHOUT AND WITH CONTRAST TECHNIQUE: Multidetector CT imaging of the abdomen and pelvis was performed using the standard protocol during bolus administration of intravenous contrast. Multiplanar reconstructed images and MIPs were obtained and reviewed to evaluate the vascular anatomy. CONTRAST:  142mL ISOVUE-370 IOPAMIDOL (ISOVUE-370) INJECTION 76% COMPARISON:  Pelvic ultrasound 11/07/2017 FINDINGS: VASCULAR Aorta: Normal caliber aorta without aneurysm, dissection, vasculitis or significant stenosis. Celiac: Patent without evidence of aneurysm, dissection, vasculitis or significant stenosis. SMA: Patent without evidence of aneurysm,  dissection, vasculitis or significant stenosis. Renals: Both renal arteries are patent without evidence of aneurysm, dissection, vasculitis,  fibromuscular dysplasia or significant stenosis. IMA: Patent without evidence of aneurysm, dissection, vasculitis or significant stenosis. Inflow: Patent without evidence of aneurysm, dissection, vasculitis or significant stenosis. Proximal Outflow: Bilateral common femoral and visualized portions of the superficial and profunda femoral arteries are patent without evidence of aneurysm, dissection, vasculitis or significant stenosis. Veins: No obvious venous abnormality within the limitations of this arterial phase study. Review of the MIP images confirms the above findings. NON-VASCULAR Lower chest: Bilateral dependent atelectatic changes. Hepatobiliary: No focal liver abnormality is seen. Status post cholecystectomy. No biliary dilatation. Pancreas: Unremarkable. No pancreatic ductal dilatation or surrounding inflammatory changes. Spleen: Normal in size without focal abnormality. Adrenals/Urinary Tract: Adrenal glands are unremarkable. Kidneys are normal, without renal calculi, focal lesion, or hydronephrosis. Bladder is unremarkable. Stomach/Bowel: Normal appearance of the stomach and small bowel. Long segment of thickening of the distal sigmoid colon/rectum, image 183/253, sequence 4. Lymphatic: No significant vascular findings are present. No enlarged abdominal or pelvic lymph nodes. Reproductive: Post recent hysterectomy. Small rim enhancing residual fluid and gas collection in the surgical bed, not amenable to percutaneous drainage. No active extravasation seen. Other: Postsurgical changes of the anterior abdominal wall. Musculoskeletal: No acute or significant osseous findings. IMPRESSION: VASCULAR No acute findings. NON-VASCULAR Status post recent hysterectomy with small heterogeneous rim enhancing gas and fluid collection in the surgical bed, likely not amenable to percutaneous drainage. This may represent residual postsurgical hematoma or infected postsurgical collection. No definite active extravasation  is identified. Long segment of symmetric mucosal thickening of the distal sigmoid colon/rectum, which borders the surgical site and may represent infectious or inflammatory changes. Electronically Signed   By: Fidela Salisbury M.D.   On: 11/10/2017 18:47   ASSESSMENT AND PLAN:   1.  Hypotension, likely related to hypovolemia - continue IV fluid resuscitation. - Hold blood pressure medications and avoid sedatives and pain killers that could lower the blood pressure.  - changed Seroquel.  * severe hypokalemia: - ICU electrolyte protocols  2.  Acute encephalopathy, likely related to sedative and pain medications, associated with hypovolemia from dehydration.  continue IV fluid resuscitation and Narcan as needed.  3.  Status post intra-abdominal bleed, status post laparoscopic clot evacuation.  Currently stable no active bleeding noted,  hemoglobin level 8.7 after 2 PRBC transfusion, recheck in am  4.  Status post recent hysterectomy, on 10/26/17 for DUB, uterine fibroids and dysmenorrhea. mgmt per OB  5.  History of hypertension, currently patient is hypotensive.  hold home blood pressure medications and continue to monitor blood pressure closely.  6.  Complex regional pain syndrome and degenerative disc disease, on chronic pain medications, muscle relaxants, antidepressive and antipsychotic medications.  We will have to adjust doses and hold some of these medications due to their potential for lowering the blood pressure.  - use Toradol and fentanyl as needed for pain control, rather than Dilaudid and oxycodone.  Will hold Zanaflex, Ambien and Xanax.       All the records are reviewed and case discussed with Care Management/Social Worker. Management plans discussed with the patient, nursing and they are in agreement.  CODE STATUS: Full Code  TOTAL TIME TAKING CARE OF THIS PATIENT: 15 minutes.   More than 50% of the time was spent in counseling/coordination of care: YES  POSSIBLE  D/C IN 1-2 DAYS, DEPENDING ON CLINICAL CONDITION.   Max Sane M.D on 11/10/2017 at 10:38 PM  Between 7am to 6pm - Pager - [phone]  After 6pm go to [email or web address] - Technical brewer [facility]  Office  [phone]  CC: Primary care physician; [facility] [place]  Note: This dictation was prepared with Diplomatic Services operational officer dictation along with smaller phrase technology. Any transcriptional errors that result from this process are unintentional.

## 2017-11-10 NOTE — Progress Notes (Signed)
Culture >=100,000 COLONIES/mL ESCHERICHIA COLI Abnormal     Dr Mortimer Fries notified of + UC, may be contamination, recollect if possible.  Pt with frequent stools so clean catch may not be possible. MD aware.  Patient and family req second opinion re rt side pain.  Writer has informed Dr Enzo Bi about this pain.  He feels it is to be expected but patient is concerned. Dr Mortimer Fries informed, he will see patient.

## 2017-11-10 NOTE — Progress Notes (Signed)
   11/10/17 1430  Clinical Encounter Type  Visited With Patient and family together  Visit Type Follow-up  Spiritual Encounters  Spiritual Needs Emotional;Prayer   Chaplain checked in with patient and family.  Patient shared thoughts and feelings regarding current health status.  Chaplain offered emotional and spiritual support; conversation around prayer and patient's prayer requests.

## 2017-11-10 NOTE — Progress Notes (Signed)
GYN note: Appreciate support in ICU/CCU. Regarding patient's abdominal pain, right-sided; complaints are similar since admission.  Patient did have post hysterectomy hematoma develop within the pelvis which was evacuated at laparoscopy.  There was no sign of active bleeding at the time of hematoma evacuation. I ordered a repeat CBC in the morning along with her BMP.  Should there be any significant drop in her hemoglobin, I would want to reassess for potential ongoing intra-abdominal bleed.  Radiology did suggest at the time of her initial pelvic ultrasound that a CT angiogram could be performed to help identify source of bleed if necessary.  Will continue to follow.  Brayton Mars, MD

## 2017-11-11 LAB — CBC
HCT: 26.8 % — ABNORMAL LOW (ref 35.0–47.0)
HEMOGLOBIN: 9 g/dL — AB (ref 12.0–16.0)
MCH: 28.6 pg (ref 26.0–34.0)
MCHC: 33.5 g/dL (ref 32.0–36.0)
MCV: 85.4 fL (ref 80.0–100.0)
Platelets: 317 10*3/uL (ref 150–440)
RBC: 3.14 MIL/uL — AB (ref 3.80–5.20)
RDW: 14.1 % (ref 11.5–14.5)
WBC: 10.2 10*3/uL (ref 3.6–11.0)

## 2017-11-11 LAB — BASIC METABOLIC PANEL
Anion gap: 8 (ref 5–15)
BUN: 7 mg/dL (ref 6–20)
CALCIUM: 8 mg/dL — AB (ref 8.9–10.3)
CO2: 25 mmol/L (ref 22–32)
Chloride: 106 mmol/L (ref 101–111)
Creatinine, Ser: 0.63 mg/dL (ref 0.44–1.00)
GLUCOSE: 105 mg/dL — AB (ref 65–99)
Potassium: 4.1 mmol/L (ref 3.5–5.1)
SODIUM: 139 mmol/L (ref 135–145)

## 2017-11-11 LAB — URINE CULTURE

## 2017-11-11 NOTE — Progress Notes (Signed)
Patient is okay for transfer to medical floor per Dr. Enzo Bi.  I have placed order.  No charge for this encounter.  After transfer, PCCM will sign off. Please call if we can be of further assistance   Merton Border, MD PCCM service Mobile [phone] Pager [phone] 11/11/2017 3:55 PM

## 2017-11-11 NOTE — Progress Notes (Signed)
GYN note: No bed available for transfer from CCU to floor. Patient just started on all of her p.o. medications. We will plan for discharge in the a.m. If no bed on the floor is available, she will be discharged from CCU. Brayton Mars, MD

## 2017-11-11 NOTE — Evaluation (Signed)
Physical Therapy Evaluation Patient Details Name: Jill Rivas MRN: [id number] DOB: 06-01-1971 Today's Date: 11/11/2017   History of Present Illness  Pt is a46 y.o.femalewith a known history of complex regional pain syndrome, depression, hypertension, migraines. Patient is status post recentTAH/BSO on 10/26/2017,complicated with vaginal bleeding requiring additional suturing of the vagina in the PACU.She was admitted again through emergency room for acute onset of vaginal bleeding.At that time her hemoglobin level was 8.8 and pelvic ultrasound showed complex fluid collection in the pelvis consistent with possible intra-abdominal bleed.Patient underwent laparoscopy with evacuation of pelvic hematoma,300 mL of organized clot. Postprocedure patient has been nauseated and vomiting several times,unable to take anything p.o. she has been receiving Dilaudid IV nausea medication.  Assessment includes: Hypotension,likely related to hypovolemia, acute encephalopathylikely related to sedative and pain medicationsassociated with hypovolemia from dehydration, and s/p hysterectomy complicated by intra-abdominal bleed s/p laparoscopic clot evacuation.    Clinical Impression  Pt presents with minor deficits in strength, transfers, gait, balance, and activity tolerance but overall did very well during this session and was in good spirits and motivated to return home.  Pt was Ind with bed mobility tasks with log roll technique training provided to minimize abdominal discomfort.  Pt steady with good eccentric and concentric control during transfers and was able to take several confident steps with a RW to the recliner with no instability noted.  Pt will benefit from a likely short duration of HHPT services upon discharge to safely address above deficits for decreased caregiver assistance and eventual return to PLOF.       Follow Up Recommendations Home health PT    Equipment Recommendations  None recommended by PT;Other (comment)(Pt declines RW)    Recommendations for Other Services       Precautions / Restrictions Precautions Precautions: Fall Restrictions Weight Bearing Restrictions: No      Mobility  Bed Mobility Overal bed mobility: Independent             General bed mobility comments: Log roll technique training provided for decreased abdominal pain with mobility with pt able to sit up from lying with good effort and without assistance  Transfers Overall transfer level: Needs assistance Equipment used: Rolling walker (2 wheeled) Transfers: Sit to/from Stand Sit to Stand: Supervision         General transfer comment: Good eccentric and concentric control during transfers with good stability upon standing  Ambulation/Gait Ambulation/Gait assistance: Supervision Ambulation Distance (Feet): 4 Feet Assistive device: Rolling walker (2 wheeled) Gait Pattern/deviations: Step-through pattern;Decreased step length - right;Decreased step length - left   Gait velocity interpretation: Below normal speed for age/gender General Gait Details: Pt steady with amb with distance limited by lines/leads  Stairs            Wheelchair Mobility    Modified Rankin (Stroke Patients Only)       Balance Overall balance assessment: Needs assistance   Sitting balance-Leahy Scale: Normal     Standing balance support: Bilateral upper extremity supported Standing balance-Leahy Scale: Good                               Pertinent Vitals/Pain Pain Assessment: 0-10 Pain Score: 5  Pain Location: Abdominal area Pain Descriptors / Indicators: Sore;Operative site guarding Pain Intervention(s): Premedicated before session;Monitored during session;Limited activity within patient's tolerance    Home Living Family/patient expects to be discharged to:: Private residence Living Arrangements: Other relatives(Pt lives with elderly family  member as her  caregiver) Available Help at Discharge: Other (Comment)(None) Type of Home: House Home Access: Level entry     Home Layout: One level Home Equipment: Crutches;Wheelchair - manual      Prior Function Level of Independence: Independent with assistive device(s)         Comments: Mod Ind amb with one lofstrand crutch, Ind with ADLs, one fall 6 months ago     Hand Dominance   Dominant Hand: Right    Extremity/Trunk Assessment   Upper Extremity Assessment Upper Extremity Assessment: Overall WFL for tasks assessed    Lower Extremity Assessment Lower Extremity Assessment: Generalized weakness;LLE deficits/detail LLE Deficits / Details: Pt with h/o CRPS in the LLE and is sensitive to light touch LLE: Unable to fully assess due to pain       Communication   Communication: No difficulties  Cognition Arousal/Alertness: Awake/alert Behavior During Therapy: WFL for tasks assessed/performed Overall Cognitive Status: Within Functional Limits for tasks assessed                                        General Comments      Exercises Total Joint Exercises Ankle Circles/Pumps: AROM;Both;5 reps;10 reps Quad Sets: Strengthening;Both;5 reps;10 reps Gluteal Sets: Strengthening;Both;10 reps Hip ABduction/ADduction: AROM;Both;5 reps Long Arc Quad: AROM;Both;10 reps Knee Flexion: AROM;Both;10 reps Other Exercises Other Exercises: HEP education and review for BLE APs, LAQ, GS, and QS x 10 each 6-8x/day   Assessment/Plan    PT Assessment Patient needs continued PT services  PT Problem List Decreased strength;Decreased activity tolerance;Decreased balance       PT Treatment Interventions DME instruction;Gait training;Functional mobility training;Balance training;Therapeutic exercise;Therapeutic activities;Patient/family education    PT Goals (Current goals can be found in the Care Plan section)  Acute Rehab PT Goals Patient Stated Goal: To get back home PT Goal  Formulation: With patient Time For Goal Achievement: 11/24/17 Potential to Achieve Goals: Good    Frequency Min 2X/week   Barriers to discharge        Co-evaluation               AM-PAC PT "6 Clicks" Daily Activity  Outcome Measure Difficulty turning over in bed (including adjusting bedclothes, sheets and blankets)?: None Difficulty moving from lying on back to sitting on the side of the bed? : None Difficulty sitting down on and standing up from a chair with arms (e.g., wheelchair, bedside commode, etc,.)?: None Help needed moving to and from a bed to chair (including a wheelchair)?: None Help needed walking in hospital room?: A Little Help needed climbing 3-5 steps with a railing? : A Little 6 Click Score: 22    End of Session Equipment Utilized During Treatment: Gait belt Activity Tolerance: Patient tolerated treatment well Patient left: in chair;with family/visitor present;with call bell/phone within reach Nurse Communication: Mobility status PT Visit Diagnosis: Difficulty in walking, not elsewhere classified (R26.2);Muscle weakness (generalized) (M62.81)    Time: 3810-1751 PT Time Calculation (min) (ACUTE ONLY): 41 min   Charges:   PT Evaluation $PT Eval Low Complexity: 1 Low PT Treatments $Therapeutic Exercise: 8-22 mins   PT G Codes:        DRoyetta Asal PT, DPT 11/11/17, 10:41 AM

## 2017-11-11 NOTE — Plan of Care (Addendum)
Pt discharged by Dr Enzo Bi, VSS, pt amenable to go home, sister in room will transport her, IV sites DCd, bleeding controlled, pt on RA, no drips, up to [facility] w/ standby assist.  Appetite returning, no diarrhea or nausea/emesis this shift.  Update: Per Patient Dr Enzo Bi has told her she can stay until tomorrow morning.  Her DC instructions have been updated by Probation officer, including a follow up appointment.  Med details have been added.  Writer will DC discharge order for now.

## 2017-11-11 NOTE — Progress Notes (Signed)
GYN progress note:  Patient doing much better overall. Bowel movements are occurring, although stools are loose (C. difficile negative). Patient has started on regular diet this morning. Overall pain is diminished; patient feels reassured given that the CTA angiogram was negative for ongoing bleed or significant pelvic pathology. Lab data review is notable for stable hematocrit and hemoglobin; normal potassium level. BP 99/85 (BP Location: Left Arm)   Pulse 64   Temp 97.7 F (36.5 C) (Oral)   Resp 11   Ht 5\' 7"  (1.702 m)   Wt 200 lb (90.7 kg)   LMP 09/30/2017 (Approximate)   SpO2 100%   BMI 31.32 kg/m  Abdomen: Soft, nontender without peritoneal signs; laparoscopy port sites are healing well as is the laparotomy Pfannenstiel incision Extremities: Nontender with and without edema  Assessment: 1.  Patient is stabilized with correction of hemoglobin and potassium levels. 2.  Postoperative pain is minimal at this time 3.  Chronic pain syndrome is ongoing 4.  Appreciate hospitalist/intensivist care.  Plan: 1.  From postop gynecologic surgery, patient is cleared for transfer to regular floor and possibly discharge later today 2.  Recommend transition to all of patient's p.o. meds, and if diet is tolerated, consider discharge.  Brayton Mars, MD  Note: This dictation was prepared with Dragon dictation along with smaller phrase technology. Any transcriptional errors that result from this process are unintentional.

## 2017-11-11 NOTE — Progress Notes (Signed)
Report called to [facility] on 2C, Pt A&O x 4, VSS on room air.  We will transport her in wheelchair, meds and chart and belongings will go with her.

## 2017-11-11 NOTE — Discharge Summary (Signed)
Physician Discharge Summary  Patient ID: Jill Rivas MRN: [id number] DOB/AGE: [birth year] 47 y.o.  Admit date: 11/07/2017 Discharge date: 11/11/2017  Admission Diagnoses: Postop vaginal bleeding; status post TAH/BSO; complex regional pain syndrome; major depressive disorder  Discharge Diagnoses:  Postop vaginal bleeding; status post TAH/BSO; pelvic hematoma; postoperative anemia, treated; status post laparoscopic adhesiolysis and evacuation of pelvic hematoma; complex regional pain syndrome; major depressive disorder  Operative Procedures: Procedure(s): Laparoscopic adhesiolysis and evacuation of pelvic hematoma  Hospital Course: Complicated Admission on 11/07/2017 for post hysterectomy bleeding and anemia through the emergency room. Laparoscopic adhesiolysis and evacuation of pelvic hematoma was performed on 11/08/2017, with findings of 300 cc of blood clot in the pelvis without obvious source of vascular bleeding. Development of hypotension and mental status changes occurred postoperatively on 11/09/2017 secondary to postoperative anemia and hypovolemia with exacerbation from polypharmacy, which required CCU admission.  Naloxone x4 doses were used to treat polypharmacy mental status changes.  Hypotension was treated with IV fluids and 2 unit packed red blood cell transfusion for symptomatic anemia from a nadir hemoglobin of 7; she is status post potassium replacement for a nadir potassium level of 2.9.  All CNS and respiratory depressants were temporarily held until vital signs, hemoglobin, and electrolyte stabilization occurred.  CTA of abdomen and pelvis revealed no active vascular bleed on 11/10/2017 On 11/11/2017 the patient was transitioned to oral medications.  She was voiding normally.  She had loose bowel movements (C. difficile titer negative).  Pain symptomatology appeared adequately controlled to a level where discharge was deemed appropriate on 11/12/2017.   Significant Diagnostic  Studies:  Lab Results  Component Value Date   HGB 9.0 (L) 11/11/2017   HGB 8.7 (L) 11/10/2017   HGB 7.1 (L) 11/09/2017   Lab Results  Component Value Date   HCT 26.8 (L) 11/11/2017   HCT 26.0 (L) 11/10/2017   HCT 21.4 (L) 11/09/2017   CBC Latest Ref Rng & Units 11/11/2017 11/10/2017 11/09/2017  WBC 3.6 - 11.0 K/uL 10.2 14.1(H) 13.1(H)  Hemoglobin 12.0 - 16.0 g/dL 9.0(L) 8.7(L) 7.1(L)  Hematocrit 35.0 - 47.0 % 26.8(L) 26.0(L) 21.4(L)  Platelets 150 - 440 K/uL 317 341 391   CMP Latest Ref Rng & Units 11/11/2017 11/10/2017 11/09/2017  Glucose 65 - 99 mg/dL 105(H) 152(H) 170(H)  BUN 6 - 20 mg/dL 7 8 12   Creatinine 0.44 - 1.00 mg/dL 0.63 0.75 1.22(H)  Sodium 135 - 145 mmol/L 139 138 140  Potassium 3.5 - 5.1 mmol/L 4.1 2.8(L) 2.9(L)  Chloride 101 - 111 mmol/L 106 107 109  CO2 22 - 32 mmol/L 25 21(L) 22  Calcium 8.9 - 10.3 mg/dL 8.0(L) 7.8(L) 8.0(L)  Total Protein 6.5 - 8.1 g/dL - - 6.1(L)  Total Bilirubin 0.3 - 1.2 mg/dL - - 0.7  Alkaline Phos 38 - 126 U/L - - 95  AST 15 - 41 U/L - - 27  ALT 14 - 54 U/L - - 17    Discharged Condition: stable  Discharge Exam: Blood pressure 120/76, pulse 64, temperature (!) 97.5 F (36.4 C), temperature source Oral, resp. rate 10, height 5\' 7"  (1.702 m), weight 200 lb (90.7 kg), last menstrual period 09/30/2017, SpO2 99 %. Incision/Wound: clean, dry and no drainage  Disposition: 01-Home or Self Care  Discharge Instructions    Discharge patient   Complete by:  As directed    1.  [facility]-Dr. Labib Cwynar 2.  Watch for evidence of fever greater than 100.5, nausea and  vomiting with inability to keep food or fluids down, or acute vaginal bleeding.   Discharge disposition:  01-Home or Self Care   Discharge patient date:  11/12/2017     Allergies as of 11/11/2017      Reactions   Goat-derived Products Swelling   Swelling and severe itching if near live goats   Other Swelling   COCKROACHES cause throat and lip  swelling.   Adhesive [tape] Rash   Paper tape causes a rash as well      Medication List    TAKE these medications   cetirizine 10 MG tablet Commonly known as:  ZYRTEC Take 10 mg by mouth at bedtime.   clonazePAM 0.5 MG tablet Commonly known as:  KLONOPIN Take 0.5 mg by mouth 2 (two) times daily.   diclofenac sodium 1 % Gel Commonly known as:  VOLTAREN Apply 2-4 g to painful area of skin 4 times per day if tolerated. Patient with history of strain, sprain, and muscle spasms of extremities   DULoxetine 30 MG capsule Commonly known as:  CYMBALTA Take 30-60 mg by mouth See admin instructions. Take 60 mg by mouth in the morning and 30 mg by mouth at bedtime.   estradiol 1 MG tablet Commonly known as:  ESTRACE Take 1.5 tablets (1.5 mg total) by mouth daily.   ibuprofen 200 MG tablet Commonly known as:  ADVIL,MOTRIN Take 800 mg by mouth daily as needed for moderate pain.   omeprazole 40 MG capsule Commonly known as:  PRILOSEC TAKE 1 TABLET BY MOUTH DAILY AT BEDTIME FOR REFLUX   Oxycodone HCl 10 MG Tabs Limit 4-6 tablets po daily if tolerated What changed:    how much to take  how to take this  when to take this  additional instructions   pravastatin 20 MG tablet Commonly known as:  PRAVACHOL Take 20 mg by mouth at bedtime.   prazosin 1 MG capsule Commonly known as:  MINIPRESS Take 1-2 mg by mouth See admin instructions. Take 1 mg by mouth twice daily and 2 mg at bedtime   propranolol ER 60 MG 24 hr capsule Commonly known as:  INDERAL LA Take 60 mg by mouth daily.   QUEtiapine 400 MG tablet Commonly known as:  SEROQUEL Take 400 mg by mouth at bedtime.   tiZANidine 4 MG tablet Commonly known as:  ZANAFLEX Take 6-8 mg by mouth See admin instructions. Take 6 mg by mouth twice daily and 8 mg at bedtime   topiramate 50 MG tablet Commonly known as:  TOPAMAX Limit 1-2 tabs by mouth twice a day if tolerated What changed:    how much to take  how to take  this  when to take this  additional instructions   triamcinolone 0.025 % cream Commonly known as:  KENALOG APPLY TO AFFECTED AREA TWICE DAILY AS NEEDED FOR ECZEMA   vitamin B-12 1000 MCG tablet Commonly known as:  CYANOCOBALAMIN Take 1,000 mcg by mouth daily as needed (low b12).   zolpidem 10 MG tablet Commonly known as:  AMBIEN Take 10 mg by mouth once daily at bedtime      Follow-up Information    Leticia Mcdiarmid, Alanda Slim, MD Follow up in 1 week(s).   Specialties:  Obstetrics and Gynecology, Radiology Why:  Post Op Check Contact information: [address] [place] [address] [phone]           Signed: Alanda Slim Brendan Gruwell 11/11/2017, 4:43 PM

## 2017-11-12 NOTE — Care Management (Signed)
Patient discharged home today. Patient lives at home with her elderly cousin.  PCP Dr. Posey Pronto at [facility].  Patient states that at baseline she ambulates with a crutch.  She also has a WC available if she needs it.  Patient declines home health PT.  Patient feels that she is  At her mobility baseline.  RNCM signing off.

## 2017-11-12 NOTE — Progress Notes (Signed)
Jill Rivas  A and O x 4. VSS. Pt tolerating diet well. No complaints of pain or nausea. IV removed intact.Pt voiced understanding of discharge instructions with no further questions. Pt discharged via wheelchair with axillary.    Allergies as of 11/12/2017      Reactions   Goat-derived Products Swelling   Swelling and severe itching if near live goats   Other Swelling   COCKROACHES cause throat and lip swelling.   Adhesive [tape] Rash   Paper tape causes a rash as well      Medication List    TAKE these medications   cetirizine 10 MG tablet Commonly known as:  ZYRTEC Take 10 mg by mouth at bedtime.   clonazePAM 0.5 MG tablet Commonly known as:  KLONOPIN Take 0.5 mg by mouth 2 (two) times daily.   diclofenac sodium 1 % Gel Commonly known as:  VOLTAREN Apply 2-4 g to painful area of skin 4 times per day if tolerated. Patient with history of strain, sprain, and muscle spasms of extremities   DULoxetine 30 MG capsule Commonly known as:  CYMBALTA Take 30-60 mg by mouth See admin instructions. Take 60 mg by mouth in the morning and 30 mg by mouth at bedtime.   estradiol 1 MG tablet Commonly known as:  ESTRACE Take 1.5 tablets (1.5 mg total) by mouth daily.   ibuprofen 200 MG tablet Commonly known as:  ADVIL,MOTRIN Take 800 mg by mouth daily as needed for moderate pain.   omeprazole 40 MG capsule Commonly known as:  PRILOSEC TAKE 1 TABLET BY MOUTH DAILY AT BEDTIME FOR REFLUX   Oxycodone HCl 10 MG Tabs Limit 4-6 tablets po daily if tolerated What changed:    how much to take  how to take this  when to take this  additional instructions   pravastatin 20 MG tablet Commonly known as:  PRAVACHOL Take 20 mg by mouth at bedtime.   prazosin 1 MG capsule Commonly known as:  MINIPRESS Take 1-2 mg by mouth See admin instructions. Take 1 mg by mouth twice daily and 2 mg at bedtime   propranolol ER 60 MG 24 hr capsule Commonly known as:  INDERAL LA Take 60 mg  by mouth daily.   QUEtiapine 400 MG tablet Commonly known as:  SEROQUEL Take 400 mg by mouth at bedtime.   tiZANidine 4 MG tablet Commonly known as:  ZANAFLEX Take 6-8 mg by mouth See admin instructions. Take 6 mg by mouth twice daily and 8 mg at bedtime   topiramate 50 MG tablet Commonly known as:  TOPAMAX Limit 1-2 tabs by mouth twice a day if tolerated What changed:    how much to take  how to take this  when to take this  additional instructions   triamcinolone 0.025 % cream Commonly known as:  KENALOG APPLY TO AFFECTED AREA TWICE DAILY AS NEEDED FOR ECZEMA   vitamin B-12 1000 MCG tablet Commonly known as:  CYANOCOBALAMIN Take 1,000 mcg by mouth daily as needed (low b12).   zolpidem 10 MG tablet Commonly known as:  AMBIEN Take 10 mg by mouth once daily at bedtime       Vitals:   11/12/17 0529 11/12/17 0938  BP: 134/79 114/65  Pulse: 89 72  Resp: 16 17  Temp: 97.9 F (36.6 C) 98.1 F (36.7 C)  SpO2: 99% 100%    Francesco Sor

## 2017-11-12 NOTE — Care Management Important Message (Signed)
Important Message  Patient Details  Name: SAMAMTHA TIEGS MRN: [id number] Date of Birth: 1971/05/17   Medicare Important Message Given:  Yes    Beverly Sessions, RN 11/12/2017, 10:34 AM

## 2017-11-12 NOTE — Progress Notes (Signed)
I didn't see patient y'day as patient was being discharged. Told ICU RN y'day.  I still notice patient being in hospital and now on floor. I will sign off. I've asked RN to check with patient's Primary OB to check on D/C.

## 2017-11-13 ENCOUNTER — Telehealth: Payer: Self-pay

## 2017-11-13 LAB — URINE CULTURE: SPECIAL REQUESTS: NORMAL

## 2017-11-13 MED ORDER — NITROFURANTOIN MONOHYD MACRO 100 MG PO CAPS: 100.0000 mg | ORAL_CAPSULE | Freq: Two times a day (BID) | ORAL | 0 refills | Status: DC

## 2017-11-13 NOTE — Telephone Encounter (Signed)
-----   Message from Brayton Mars, MD sent at 11/13/2017 12:51 PM EST ----- Please notify - Abnormal Labs Urine culture positive for possible UTI Please call in Macrobid twice a day for 7 days

## 2017-11-14 LAB — CULTURE, BLOOD (ROUTINE X 2)
CULTURE: NO GROWTH
Culture: NO GROWTH

## 2017-11-18 ENCOUNTER — Encounter: Payer: Self-pay | Admitting: Obstetrics and Gynecology

## 2017-11-18 ENCOUNTER — Ambulatory Visit (INDEPENDENT_AMBULATORY_CARE_PROVIDER_SITE_OTHER): Payer: Medicare Other | Admitting: Obstetrics and Gynecology

## 2017-11-18 VITALS — BP 125/95 | HR 109 | Ht 67.0 in | Wt 190.0 lb

## 2017-11-18 DIAGNOSIS — Z9889 Other specified postprocedural states: Secondary | ICD-10-CM

## 2017-11-18 DIAGNOSIS — Z09 Encounter for follow-up examination after completed treatment for conditions other than malignant neoplasm: Secondary | ICD-10-CM

## 2017-11-18 NOTE — Progress Notes (Signed)
Chief complaint: 1.  1 week postop check; 4 weeks status post TAH 2.  Status post adhesiolysis and evacuation of pelvic hematoma 3.  Status post complicated hospitalization requiring ICU admission for polypharmacy drug interactions 4.  Status post 2 unit packed red blood cell transfusion for low hemoglobin 5.  Status post potassium supplementation due to low potassium of 2.9  Patient feels great.  She is not having any significant abdominal pelvic pain.  Bowel function is normal.  Bladder function is normal.  She is experiencing mild discharge from the vagina.  She is now approximately 3-4 weeks status post abdominal hysterectomy.  OBJECTIVE: BP (!) 125/95   Pulse (!) 109   Ht 5\' 7"  (1.702 m)   Wt 190 lb (86.2 kg)   LMP 09/30/2017 (Approximate)   BMI 29.76 kg/m  Pleasant female in no acute distress.  Alert and oriented. Abdomen: Soft, nontender; laparoscopy port sites are healing well and covered with Dermabond glue.  Pfannenstiel incision is healing well. Pelvic exam: Deferred Extremities: Warm and dry; good capillary refill  ASSESSMENT: 1.  Normal 1 week postop check status post laparoscopic adhesiolysis and evacuation of pelvic hematoma  PLAN: 1.  Gradually resume activities as tolerated 2.  Return to see Dr. Amalia Hailey for final postop check after hysterectomy as scheduled 3.  Resume chronic pain management protocols with Dr. Danny Lawless, MD  Note: This dictation was prepared with Dragon dictation along with smaller phrase technology. Any transcriptional errors that result from this process are unintentional.

## 2017-11-18 NOTE — Patient Instructions (Signed)
1.  Gradually resume activities as tolerated. 2.  Return to see Dr. Amalia Hailey for hysterectomy postop check as scheduled 3.  Resume care with Dr. Mohammed Kindle for chronic pain management.  This visit gives postsurgical clearance for resumption of chronic pain medicine protocol.

## 2017-11-30 ENCOUNTER — Other Ambulatory Visit: Payer: Self-pay

## 2017-11-30 MED ORDER — ESTRADIOL 1 MG PO TABS: 1.5000 mg | ORAL_TABLET | Freq: Every day | ORAL | 1 refills | Status: DC

## 2017-12-09 ENCOUNTER — Encounter: Payer: Self-pay | Admitting: Obstetrics and Gynecology

## 2017-12-09 ENCOUNTER — Ambulatory Visit (INDEPENDENT_AMBULATORY_CARE_PROVIDER_SITE_OTHER): Payer: Medicare Other | Admitting: Obstetrics and Gynecology

## 2017-12-09 VITALS — BP 130/80 | HR 95 | Wt 192.6 lb

## 2017-12-09 DIAGNOSIS — Z79890 Hormone replacement therapy (postmenopausal): Secondary | ICD-10-CM

## 2017-12-09 DIAGNOSIS — Z9889 Other specified postprocedural states: Secondary | ICD-10-CM

## 2017-12-09 MED ORDER — ESTRADIOL 1 MG PO TABS: 1.5000 mg | ORAL_TABLET | Freq: Every day | ORAL | 1 refills | Status: DC

## 2017-12-09 NOTE — Progress Notes (Signed)
Pt is a little sore but no other concerns.

## 2017-12-09 NOTE — Progress Notes (Signed)
HPI:      [name] is a 47 y.o. G0P0000 who LMP was Patient's last menstrual period was 09/30/2017 (approximate).  Subjective:   She presents today 6 weeks from hysterectomy and oophorectomy with subsequent vaginal cuff bleeding and laparoscopic evacuation of pelvic hematoma.  She reports that she is a little bit sore still but mostly feels well.  Has resumed normal activities exception of heavy lifting and intercourse.  She reports no vaginal bleeding. She is very happy with her estradiol and would like to continue.  She says the medication has changed her life.    Hx: The following portions of the patient's history were reviewed and updated as appropriate:             She  has a past medical history of Anxiety, Bilateral occipital neuralgia, Compression fracture of cervical vertebra ([place]) (2018), CRPS (complex regional pain syndrome type I) (2019), CRPS (complex regional pain syndrome), DDD (degenerative disc disease), thoracolumbar (2019), Depression, Dysrhythmia, GERD (gastroesophageal reflux disease), History of kidney stones, Hypertension (2019), Kidney stones, Migraines (2019), PONV (postoperative nausea and vomiting), PTSD (post-traumatic stress disorder), Reflex sympathetic dystrophy, Reflex sympathetic dystrophy of lower limb, and Sleep apnea (2017). She does not have any pertinent problems on file. She  has a past surgical history that includes Cholecystectomy; Foot surgery (Left, 1993); Kidney surgery (Left, 2005); Breast surgery; Breast enhancement surgery (Bilateral); Augmentation mammaplasty (Bilateral, 2005); Repair vaginal cuff (N/A, 10/26/2017); Abdominal hysterectomy; and laparoscopy (N/A, 11/08/2017). Her family history includes Breast cancer in her mother; Cancer in her mother. She  reports that  has never smoked. she has never used smokeless tobacco. She reports that she does not drink alcohol or use drugs. She has a current medication list which includes the  following prescription(s): cetirizine, clonazepam, diclofenac sodium, duloxetine, estradiol, ibuprofen, nitrofurantoin (macrocrystal-monohydrate), omeprazole, oxycodone hcl, pravastatin, prazosin, propranolol er, quetiapine, tizanidine, topiramate, triamcinolone, vitamin b-12, and zolpidem. She is allergic to goat-derived products; other; and adhesive [tape].       Review of Systems:  Review of Systems  Constitutional: Denied constitutional symptoms, night sweats, recent illness, fatigue, fever, insomnia and weight loss.  Eyes: Denied eye symptoms, eye pain, photophobia, vision change and visual disturbance.  Ears/Nose/Throat/Neck: Denied ear, nose, throat or neck symptoms, hearing loss, nasal discharge, sinus congestion and sore throat.  Cardiovascular: Denied cardiovascular symptoms, arrhythmia, chest pain/pressure, edema, exercise intolerance, orthopnea and palpitations.  Respiratory: Denied pulmonary symptoms, asthma, pleuritic pain, productive sputum, cough, dyspnea and wheezing.  Gastrointestinal: Denied, gastro-esophageal reflux, melena, nausea and vomiting.  Genitourinary: Denied genitourinary symptoms including symptomatic vaginal discharge, pelvic relaxation issues, and urinary complaints.  Musculoskeletal: Denied musculoskeletal symptoms, stiffness, swelling, muscle weakness and myalgia.  Dermatologic: Denied dermatology symptoms, rash and scar.  Neurologic: Denied neurology symptoms, dizziness, headache, neck pain and syncope.  Psychiatric: Denied psychiatric symptoms, anxiety and depression.  Endocrine: Denied endocrine symptoms including hot flashes and night sweats.   Meds:   Current Outpatient Medications on File Prior to Visit  Medication Sig Dispense Refill  . cetirizine (ZYRTEC) 10 MG tablet Take 10 mg by mouth at bedtime.   5  . clonazePAM (KLONOPIN) 0.5 MG tablet Take 0.5 mg by mouth 2 (two) times daily.    . diclofenac sodium (VOLTAREN) 1 % GEL Apply 2-4 g to painful  area of skin 4 times per day if tolerated. Patient with history of strain, sprain, and muscle spasms of extremities 500 g 0  . DULoxetine (CYMBALTA) 30 MG capsule Take 30-60 mg by  mouth See admin instructions. Take 60 mg by mouth in the morning and 30 mg by mouth at bedtime.  0  . estradiol (ESTRACE) 1 MG tablet Take 1.5 tablets (1.5 mg total) by mouth daily. 135 tablet 1  . ibuprofen (ADVIL,MOTRIN) 200 MG tablet Take 800 mg by mouth daily as needed for moderate pain.    . nitrofurantoin, macrocrystal-monohydrate, (MACROBID) 100 MG capsule Take 1 capsule (100 mg total) by mouth 2 (two) times daily. 14 capsule 0  . omeprazole (PRILOSEC) 40 MG capsule TAKE 1 TABLET BY MOUTH DAILY AT BEDTIME FOR REFLUX  5  . Oxycodone HCl 10 MG TABS Limit 4-6 tablets po daily if tolerated (Patient taking differently: Take 10 mg by mouth every 4 (four) hours. ) 180 tablet 0  . pravastatin (PRAVACHOL) 20 MG tablet Take 20 mg by mouth at bedtime.    . prazosin (MINIPRESS) 1 MG capsule Take 1-2 mg by mouth See admin instructions. Take 1 mg by mouth twice daily and 2 mg at bedtime    . propranolol ER (INDERAL LA) 60 MG 24 hr capsule Take 60 mg by mouth daily.    . QUEtiapine (SEROQUEL) 400 MG tablet Take 400 mg by mouth at bedtime.  2  . tiZANidine (ZANAFLEX) 4 MG tablet Take 6-8 mg by mouth See admin instructions. Take 6 mg by mouth twice daily and 8 mg at bedtime  2  . topiramate (TOPAMAX) 50 MG tablet Limit 1-2 tabs by mouth twice a day if tolerated (Patient taking differently: Take 100 mg by mouth 2 (two) times daily. ) 120 tablet 2  . triamcinolone (KENALOG) 0.025 % cream APPLY TO AFFECTED AREA TWICE DAILY AS NEEDED FOR ECZEMA  2  . vitamin B-12 (CYANOCOBALAMIN) 1000 MCG tablet Take 1,000 mcg by mouth daily as needed (low b12).    . zolpidem (AMBIEN) 10 MG tablet Take 10 mg by mouth once daily at bedtime  5   No current facility-administered medications on file prior to visit.     Objective:     Vitals:    12/09/17 1339  BP: 130/80  Pulse: 95               Abdomen: Soft.  Non-tender.  No masses.  No HSM.  Incision/s: Intact.  Healing well.  No erythema.  No drainage.      Assessment:    G0P0000 Patient Active Problem List   Diagnosis Date Noted  . Status post laparoscopic adhesiolysis 11/08/2017  . Vaginal bleeding 11/07/2017  . Post-operative state 10/26/2017  . Moderate episode of recurrent major depressive disorder ([place])   . Chest pain 07/10/2017  . Bilateral occipital neuralgia 01/10/2016  . Cervical disc disorder with radiculopathy of cervical region 02/27/2015  . 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 DDD (degenerative disc disease), cervical with radiculopathy 01/23/2015  . CRPS 1 (complex regional pain syndrome I) of lower limb 01/23/2015  . DDD (degenerative disc disease), lumbosacral 01/23/2015     1. Post-operative state   2. Postmenopausal hormone therapy     Patient doing well with slower than normal recovery but still making significant progress. Very happy on estrogen.   Plan:            1.  Patient may slowly resume normal activities  2.  Continue estradiol  3.  Follow-up in 3 months. Orders No orders of the defined types were placed in this encounter.   No orders of the defined types were placed in this encounter.  F/U  Return in about 3 months (around 03/11/2018). I spent 18 minutes with this patient of which greater than 50% was spent discussing postop care, estrogen.  Finis Bud, M.D. 12/09/2017 2:26 PM

## 2018-03-11 ENCOUNTER — Encounter: Payer: Self-pay | Admitting: Obstetrics and Gynecology

## 2018-03-30 ENCOUNTER — Encounter: Payer: Self-pay | Admitting: Obstetrics and Gynecology

## 2018-05-17 ENCOUNTER — Other Ambulatory Visit: Payer: Self-pay | Admitting: Family Medicine

## 2018-05-17 DIAGNOSIS — Z1231 Encounter for screening mammogram for malignant neoplasm of breast: Secondary | ICD-10-CM

## 2018-06-23 ENCOUNTER — Other Ambulatory Visit: Payer: Self-pay | Admitting: Obstetrics and Gynecology

## 2018-06-23 DIAGNOSIS — Z79890 Hormone replacement therapy (postmenopausal): Secondary | ICD-10-CM

## 2018-06-24 NOTE — Telephone Encounter (Signed)
Pt needs an appointment

## 2018-07-31 ENCOUNTER — Other Ambulatory Visit: Payer: Self-pay | Admitting: Obstetrics and Gynecology

## 2018-07-31 DIAGNOSIS — Z79890 Hormone replacement therapy (postmenopausal): Secondary | ICD-10-CM

## 2018-08-04 ENCOUNTER — Encounter: Payer: Self-pay | Admitting: Obstetrics and Gynecology

## 2018-08-04 ENCOUNTER — Ambulatory Visit (INDEPENDENT_AMBULATORY_CARE_PROVIDER_SITE_OTHER): Payer: Medicare Other | Admitting: Obstetrics and Gynecology

## 2018-08-04 VITALS — BP 139/87 | HR 89 | Ht 67.0 in | Wt 194.0 lb

## 2018-08-04 DIAGNOSIS — N930 Postcoital and contact bleeding: Secondary | ICD-10-CM

## 2018-08-04 NOTE — Progress Notes (Signed)
Pt presents today complaining of abnormal bleeding after her hysterectomy (11/08/17). Pt states she has experienced spotting 2-3 times over the past few months. Pt began bleeding excessively 07/31/18-08/03/18. Pt states she is passing bright red blood and clots. Pt is also complaining of slight cramps.

## 2018-08-04 NOTE — Progress Notes (Signed)
HPI:      [name] is a 47 y.o. G0P0000 who LMP was Patient's last menstrual period was 09/30/2017 (approximate).  Subjective:   She presents today after 3 days of vaginal bleeding which she says was like a light menstrual period.  This began on Saturday after intercourse.  Prior to that she occasionally had a small amount of spotting but says it was not a problem. She continues to take her estrogen and is very happy to be on this medicine.  She has no other complaints. Of significant note patient had a hysterectomy in October 24, 2017    Hx: The following portions of the patient's history were reviewed and updated as appropriate:             She  has a past medical history of Anxiety, Bilateral occipital neuralgia, Compression fracture of cervical vertebra ([place]) (2018), CRPS (complex regional pain syndrome type I) (2019), CRPS (complex regional pain syndrome), DDD (degenerative disc disease), thoracolumbar (2019), Depression, Dysrhythmia, GERD (gastroesophageal reflux disease), History of kidney stones, Hypertension (2019), Kidney stones, Migraines (2019), PONV (postoperative nausea and vomiting), PTSD (post-traumatic stress disorder), Reflex sympathetic dystrophy, Reflex sympathetic dystrophy of lower limb, and Sleep apnea (2017). She does not have any pertinent problems on file. She  has a past surgical history that includes Cholecystectomy; Foot surgery (Left, 1993); Kidney surgery (Left, 2005); Breast surgery; Breast enhancement surgery (Bilateral); Augmentation mammaplasty (Bilateral, 2005); Repair vaginal cuff (N/A, 10/26/2017); Abdominal hysterectomy; and laparoscopy (N/A, 11/08/2017). Her family history includes Breast cancer in her mother; Cancer in her mother. She  reports that she has never smoked. She has never used smokeless tobacco. She reports that she does not drink alcohol or use drugs. She has a current medication list which includes the following prescription(s):  cetirizine, diclofenac sodium, duloxetine, estradiol, ibuprofen, omeprazole, oxycodone hcl, pravastatin, prazosin, propranolol er, tizanidine, topiramate, and triamcinolone. She is allergic to goat-derived products; other; and adhesive [tape].       Review of Systems:  Review of Systems  Constitutional: Denied constitutional symptoms, night sweats, recent illness, fatigue, fever, insomnia and weight loss.  Eyes: Denied eye symptoms, eye pain, photophobia, vision change and visual disturbance.  Ears/Nose/Throat/Neck: Denied ear, nose, throat or neck symptoms, hearing loss, nasal discharge, sinus congestion and sore throat.  Cardiovascular: Denied cardiovascular symptoms, arrhythmia, chest pain/pressure, edema, exercise intolerance, orthopnea and palpitations.  Respiratory: Denied pulmonary symptoms, asthma, pleuritic pain, productive sputum, cough, dyspnea and wheezing.  Gastrointestinal: Denied, gastro-esophageal reflux, melena, nausea and vomiting.  Genitourinary: See HPI for additional information.  Musculoskeletal: Denied musculoskeletal symptoms, stiffness, swelling, muscle weakness and myalgia.  Dermatologic: Denied dermatology symptoms, rash and scar.  Neurologic: Denied neurology symptoms, dizziness, headache, neck pain and syncope.  Psychiatric: Denied psychiatric symptoms, anxiety and depression.  Endocrine: Denied endocrine symptoms including hot flashes and night sweats.   Meds:   Current Outpatient Medications on File Prior to Visit  Medication Sig Dispense Refill  . cetirizine (ZYRTEC) 10 MG tablet Take 10 mg by mouth at bedtime.   5  . diclofenac sodium (VOLTAREN) 1 % GEL Apply 2-4 g to painful area of skin 4 times per day if tolerated. Patient with history of strain, sprain, and muscle spasms of extremities 500 g 0  . DULoxetine (CYMBALTA) 30 MG capsule Take 30-60 mg by mouth See admin instructions. Take 60 mg by mouth in the morning and 30 mg by mouth at bedtime.  0  .  estradiol (ESTRACE) 1 MG tablet TAKE 1 &  1/2 TABLETS (1.5 MG TOTAL) BY MOUTH DAILY. 45 tablet 0  . ibuprofen (ADVIL,MOTRIN) 200 MG tablet Take 800 mg by mouth daily as needed for moderate pain.    Marland Kitchen omeprazole (PRILOSEC) 40 MG capsule TAKE 1 TABLET BY MOUTH DAILY AT BEDTIME FOR REFLUX  5  . Oxycodone HCl 10 MG TABS Limit 4-6 tablets po daily if tolerated (Patient taking differently: Take 10 mg by mouth every 4 (four) hours. ) 180 tablet 0  . pravastatin (PRAVACHOL) 20 MG tablet Take 20 mg by mouth at bedtime.    . prazosin (MINIPRESS) 1 MG capsule Take 1-2 mg by mouth See admin instructions. Take 1 mg by mouth twice daily and 2 mg at bedtime    . propranolol ER (INDERAL LA) 60 MG 24 hr capsule Take 60 mg by mouth daily.    Marland Kitchen tiZANidine (ZANAFLEX) 4 MG tablet Take 6-8 mg by mouth See admin instructions. Take 6 mg by mouth twice daily and 8 mg at bedtime  2  . topiramate (TOPAMAX) 50 MG tablet Limit 1-2 tabs by mouth twice a day if tolerated (Patient taking differently: Take 100 mg by mouth 2 (two) times daily. ) 120 tablet 2  . triamcinolone (KENALOG) 0.025 % cream APPLY TO AFFECTED AREA TWICE DAILY AS NEEDED FOR ECZEMA  2   No current facility-administered medications on file prior to visit.     Objective:     Vitals:   08/04/18 1347  BP: 139/87  Pulse: 89              Physical examination   Pelvic:   Vulva: Normal appearance.  No lesions.  Vagina: No lesions or abnormalities noted.  Several small spots of reddish irritated appearing tissue noted at the vaginal cuff.  All within a 2 cm circular area.  No other abnormalities of the vagina or cuff noted.  Patient nontender to examination.  Support: Normal pelvic support.  Urethra No masses tenderness or scarring.  Meatus Normal size without lesions or prolapse.  Cervix:  Surgically absent  Anus: Normal exam.  No lesions.  Perineum: Normal exam.  No lesions.        Bimanual   Uterus:  Surgically absent  Adnexae: No masses.  Non-tender  to palpation.  Cul-de-sac: Negative for abnormality.   Silver nitrate used to cauterize the reddened areas of the vaginal cuff.  This seems as the most likely area to be the source of her vaginal bleeding.  Assessment:    G0P0000 Patient Active Problem List   Diagnosis Date Noted  . Status post laparoscopic adhesiolysis 11/08/2017  . Vaginal bleeding 11/07/2017  . Post-operative state 10/26/2017  . Moderate episode of recurrent major depressive disorder ([place])   . Chest pain 07/10/2017  . Bilateral occipital neuralgia 01/10/2016  . Cervical disc disorder with radiculopathy of cervical region 02/27/2015  . 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 DDD (degenerative disc disease), cervical with radiculopathy 01/23/2015  . CRPS 1 (complex regional pain syndrome I) of lower limb 01/23/2015  . DDD (degenerative disc disease), lumbosacral 01/23/2015     1. Postcoital bleeding     Likely granulation tissue of the vaginal cuff.   Plan:            1.  Silver nitrate used to cauterize this area.  Expect no further bleeding.  I have asked her to present to the office on the day of bleeding should this occur again. Orders No orders of the defined types were placed in  this encounter.   No orders of the defined types were placed in this encounter.     F/U  Return for Annual Physical. I spent 16 minutes involved in the care of this patient of which greater than 50% was spent discussing vaginal bleeding after hysterectomy, bleeding after intercourse, methods of treatment, future follow-ups.  Finis Bud, M.D. 08/04/2018 2:27 PM

## 2018-08-15 ENCOUNTER — Other Ambulatory Visit: Payer: Self-pay

## 2018-08-15 DIAGNOSIS — Z79890 Hormone replacement therapy (postmenopausal): Secondary | ICD-10-CM

## 2018-08-16 ENCOUNTER — Other Ambulatory Visit: Payer: Self-pay

## 2018-08-16 DIAGNOSIS — Z79890 Hormone replacement therapy (postmenopausal): Secondary | ICD-10-CM

## 2018-08-16 MED ORDER — ESTRADIOL 1 MG PO TABS: 1.0000 mg | ORAL_TABLET | Freq: Every day | ORAL | 0 refills | Status: DC

## 2018-08-16 MED ORDER — ESTRADIOL 1 MG PO TABS: ORAL_TABLET | ORAL | 1 refills | Status: DC

## 2018-08-23 ENCOUNTER — Other Ambulatory Visit: Payer: Self-pay

## 2018-08-23 DIAGNOSIS — Z79890 Hormone replacement therapy (postmenopausal): Secondary | ICD-10-CM

## 2018-08-26 ENCOUNTER — Other Ambulatory Visit: Payer: Self-pay

## 2018-08-26 DIAGNOSIS — Z79890 Hormone replacement therapy (postmenopausal): Secondary | ICD-10-CM

## 2018-08-26 MED ORDER — ESTRADIOL 1 MG PO TABS: ORAL_TABLET | ORAL | 1 refills | Status: DC

## 2018-09-29 ENCOUNTER — Other Ambulatory Visit: Payer: Self-pay | Admitting: Obstetrics and Gynecology

## 2018-09-29 DIAGNOSIS — Z79890 Hormone replacement therapy (postmenopausal): Secondary | ICD-10-CM

## 2019-02-13 ENCOUNTER — Other Ambulatory Visit: Payer: Self-pay | Admitting: Obstetrics and Gynecology

## 2019-02-13 DIAGNOSIS — Z79890 Hormone replacement therapy (postmenopausal): Secondary | ICD-10-CM

## 2019-05-08 ENCOUNTER — Other Ambulatory Visit: Payer: Self-pay | Admitting: Obstetrics and Gynecology

## 2019-05-08 DIAGNOSIS — Z79890 Hormone replacement therapy (postmenopausal): Secondary | ICD-10-CM

## 2019-05-09 NOTE — Telephone Encounter (Signed)
Patient to schedule annual examination and ERT follow-up.

## 2019-06-16 ENCOUNTER — Other Ambulatory Visit: Payer: Self-pay | Admitting: Obstetrics and Gynecology

## 2019-06-16 DIAGNOSIS — Z79890 Hormone replacement therapy (postmenopausal): Secondary | ICD-10-CM

## 2019-06-20 ENCOUNTER — Telehealth: Payer: Self-pay | Admitting: Surgical

## 2019-06-20 NOTE — Telephone Encounter (Signed)
LM for patient to call the office. I have sent in 1 month refill. She will need to schedulappointment for further refills.

## 2019-07-17 ENCOUNTER — Other Ambulatory Visit: Payer: Self-pay | Admitting: Obstetrics and Gynecology

## 2019-07-17 DIAGNOSIS — Z79890 Hormone replacement therapy (postmenopausal): Secondary | ICD-10-CM

## 2019-07-18 ENCOUNTER — Encounter: Payer: Self-pay | Admitting: Surgical

## 2019-07-29 ENCOUNTER — Other Ambulatory Visit: Payer: Self-pay | Admitting: Obstetrics and Gynecology

## 2019-07-29 DIAGNOSIS — Z79890 Hormone replacement therapy (postmenopausal): Secondary | ICD-10-CM

## 2019-08-01 NOTE — Telephone Encounter (Signed)
Called and LM for patient to scheduled appointment for further refills.

## 2019-09-01 ENCOUNTER — Other Ambulatory Visit: Payer: Self-pay | Admitting: Obstetrics and Gynecology

## 2019-09-01 DIAGNOSIS — Z79890 Hormone replacement therapy (postmenopausal): Secondary | ICD-10-CM

## 2019-09-01 NOTE — Telephone Encounter (Signed)
I have tried to call patient several times to schedule appointment. She has not returned call.

## 2019-09-20 ENCOUNTER — Other Ambulatory Visit: Payer: Self-pay | Admitting: Family Medicine

## 2019-09-20 DIAGNOSIS — Z1231 Encounter for screening mammogram for malignant neoplasm of breast: Secondary | ICD-10-CM

## 2019-09-20 DIAGNOSIS — Z803 Family history of malignant neoplasm of breast: Secondary | ICD-10-CM

## 2019-09-20 DIAGNOSIS — N644 Mastodynia: Secondary | ICD-10-CM

## 2019-09-28 ENCOUNTER — Encounter: Payer: Self-pay | Admitting: Family Medicine

## 2019-09-28 ENCOUNTER — Telehealth: Payer: Self-pay

## 2019-09-28 NOTE — Telephone Encounter (Signed)
Called patient to scheduled Colonoscopy. Patient states she is having some constipation. Made patient a appointment on 10/06/2019 to discuss this

## 2019-10-06 ENCOUNTER — Ambulatory Visit: Payer: Medicare Other | Admitting: Gastroenterology

## 2019-10-06 ENCOUNTER — Other Ambulatory Visit: Payer: Self-pay

## 2019-10-06 ENCOUNTER — Ambulatory Visit (INDEPENDENT_AMBULATORY_CARE_PROVIDER_SITE_OTHER): Payer: Medicare Other | Admitting: Gastroenterology

## 2019-10-06 ENCOUNTER — Encounter: Payer: Self-pay | Admitting: Gastroenterology

## 2019-10-06 DIAGNOSIS — Z800 Family history of malignant neoplasm of digestive organs: Secondary | ICD-10-CM

## 2019-10-06 DIAGNOSIS — R748 Abnormal levels of other serum enzymes: Secondary | ICD-10-CM

## 2019-10-06 MED ORDER — NA SULFATE-K SULFATE-MG SULF 17.5-3.13-1.6 GM/177ML PO SOLN: 354.0000 mL | Freq: Once | ORAL | 0 refills | Status: AC

## 2019-10-06 MED ORDER — BISACODYL EC 5 MG PO TBEC: DELAYED_RELEASE_TABLET | ORAL | 0 refills | Status: DC

## 2019-10-06 MED ORDER — POLYETHYLENE GLYCOL 3350 17 GM/SCOOP PO POWD: ORAL | 1 refills | Status: AC

## 2019-10-06 MED ORDER — NA SULFATE-K SULFATE-MG SULF 17.5-3.13-1.6 GM/177ML PO SOLN: 354.0000 mL | Freq: Once | ORAL | 0 refills | Status: DC

## 2019-10-06 NOTE — Progress Notes (Signed)
Jill Rivas [address]  [place]  [place], [place] [address]  Main: [phone]  Fax: [phone]   Gastroenterology Consultation  Referring Provider:     [facility] Physician:  [facility] [place] Reason for Consultation:     Colon cancer screening, family history of colon cancer        HPI:   Virtual Visit via Video Note  I connected with patient on 10/06/19 at  9:45 AM EST by video ([email or web address]) and verified that I am speaking with the correct person using two identifiers.   I discussed the limitations, risks, security and privacy concerns of performing an evaluation and management service by video and the availability of in person appointments. I also discussed with the patient that there may be a patient responsible charge related to this service. The patient expressed understanding and agreed to proceed.  Location of the patient: Home Location of provider: Home Participating persons: Patient and provider only (Nursing staff checked in patient via phone but were not physically involved in the video interaction - see their notes)   History of Present Illness: Chief Complaint  Patient presents with  . New Patient (Initial Visit)  . Constipation    Patient states this been going on since she was born. She states it can be 3 weeks before she will have bowel movement.     Jill Rivas is a 49 y.o. y/o female referred for consultation & management  by Dr. Domingo Madeira, [place].  Patient reports family history of colon cancer in mother.  Had a colonoscopy over 10 years ago, but does not remember what it was for.  She also states that she had genetic testing done for colon and breast cancer due to her mother's history of these cancers.  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  This was done by her primary care provider.  Lab work sent to [place] does not include these tests.  However,  liver enzymes in December 2020 showed a mildly elevated alk phos.  Past Medical History:  Diagnosis Date  . Anxiety   . Bilateral occipital neuralgia   . Compression fracture of cervical vertebra ([place]) 2018  . CRPS (complex regional pain syndrome type I) 2019  . CRPS (complex regional pain syndrome)   . DDD (degenerative disc disease), thoracolumbar 2019  . Depression   . Dysrhythmia    tacchycardia and bradycardia  . GERD (gastroesophageal reflux disease)   . History of kidney stones   . Hypertension 2019   blood pressure elevated due to anxiety  . Kidney stones   . Migraines 2800   complicated migraines can go into seizure like activity.  inderal and sleep help.  Marland Kitchen PONV (postoperative nausea and vomiting)    some nausea after surgery  . PTSD (post-traumatic stress disorder)   . Reflex sympathetic dystrophy    left foot  . Reflex sympathetic dystrophy of lower limb    foot  . Sleep apnea 2017   tested again approx 1 month ago and sleep apnea has resolved.    Past Surgical History:  Procedure Laterality Date  . ABDOMINAL HYSTERECTOMY    . AUGMENTATION MAMMAPLASTY Bilateral 2005   SALINE  . BREAST ENHANCEMENT SURGERY Bilateral   . BREAST SURGERY    . CHOLECYSTECTOMY    . FOOT SURGERY Left 1993   to free the nerve in ankle  . 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 LAPAROSCOPY N/A 11/08/2017   Procedure: LAPAROSCOPY DIAGNOSTIC;  Surgeon: Brayton Mars, MD;  Location: [facility] ORS;  Service: Gynecology;  Laterality: N/A;  . REPAIR VAGINAL CUFF N/A 10/26/2017   Procedure: REPAIR VAGINAL CUFF;  Surgeon: Harlin Heys, MD;  Location: [facility] ORS;  Service: Gynecology;  Laterality: N/A;    Prior to Admission medications   Medication Sig Start Date End Date Taking? Authorizing Provider  AMITIZA 24 MCG capsule Take 24 mcg by mouth 2 (two) times daily. 09/29/19  Yes [provider]  cetirizine (ZYRTEC)  10 MG tablet Take 10 mg by mouth at bedtime.  09/28/17  Yes [provider]  diclofenac sodium (VOLTAREN) 1 % GEL Apply 2-4 g to painful area of skin 4 times per day if tolerated. Patient with history of strain, sprain, and muscle spasms of extremities 05/15/16  Yes Mohammed Kindle, MD  dicyclomine (BENTYL) 20 MG tablet Take 20 mg by mouth 4 (four) times daily as needed. 09/23/19  Yes [provider]  DULoxetine (CYMBALTA) 30 MG capsule Take 30-60 mg by mouth See admin instructions. Take 60 mg by mouth in the morning and 30 mg by mouth at bedtime. 06/23/17  Yes [provider]  eletriptan (RELPAX) 20 MG tablet Take 20 mg by mouth 2 (two) times daily as needed. 09/12/19  Yes [provider]  estradiol (ESTRACE) 1 MG tablet TAKE 1 & 1/2 TABLETS (1.5 MG TOTAL) BY MOUTH DAILY. 07/18/19  Yes Harlin Heys, MD  ibuprofen (ADVIL,MOTRIN) 200 MG tablet Take 800 mg by mouth daily as needed for moderate pain.   Yes [provider]  omeprazole (PRILOSEC) 40 MG capsule TAKE 1 TABLET BY MOUTH DAILY AT BEDTIME FOR REFLUX 02/08/17  Yes [provider]  ondansetron (ZOFRAN-ODT) 4 MG disintegrating tablet Take 4 mg by mouth every 8 (eight) hours as needed. 09/02/19  Yes [provider]  Oxycodone HCl 10 MG TABS Limit 4-6 tablets po daily if tolerated Patient taking differently: Take 10 mg by mouth every 4 (four) hours.  05/15/16  Yes Mohammed Kindle, MD  polyethylene glycol powder Sanjuan Dame) 17 GM/SCOOP powder  03/23/19  Yes [provider]  pravastatin (PRAVACHOL) 20 MG tablet Take 20 mg by mouth at bedtime.   Yes [provider]  prazosin (MINIPRESS) 1 MG capsule Take 1-2 mg by mouth See admin instructions. Take 1 mg by mouth twice daily and 2 mg at bedtime   Yes [provider]  propranolol ER (INDERAL LA) 120 MG 24 hr capsule Take 120 mg by mouth at bedtime. 09/08/19  Yes [provider]  tiZANidine (ZANAFLEX) 4 MG tablet  Take 6-8 mg by mouth See admin instructions. Take 6 mg by mouth twice daily and 8 mg at bedtime 06/24/17  Yes [provider]  topiramate (TOPAMAX) 50 MG tablet Limit 1-2 tabs by mouth twice a day if tolerated Patient taking differently: Take 100 mg by mouth 2 (two) times daily.  04/15/16  Yes Mohammed Kindle, MD  triamcinolone (KENALOG) 0.025 % cream APPLY TO AFFECTED AREA TWICE DAILY AS NEEDED FOR ECZEMA 06/22/17  Yes [provider]  bisacodyl (BISACODYL) 5 MG EC tablet Take 2 tablets (66m) by mouth the day before your procedure between 1pm-3pm. 10/06/19   TVirgel Manifold MD  Na Sulfate-K Sulfate-Mg Sulf 17.5-3.13-1.6 GM/177ML SOLN Take 354 mLs by mouth once for 1 dose. 10/06/19 10/06/19  TVirgel Manifold MD  polyethylene glycol powder (GLYCOLAX/MIRALAX) 17 GM/SCOOP powder Please take 34 grams for 7 days before your prep. Start medication on 10/16/2019 10/06/19   Virgel Manifold, MD    Family History  Problem Relation Age of Onset  . Cancer Mother   . Breast cancer Mother   . Colon cancer Mother   . Colon polyps Mother   . Colon polyps Paternal Aunt      Social History   Tobacco Use  . Smoking status: Never Smoker  . Smokeless tobacco: Never Used  Substance Use Topics  . Alcohol use: No    Alcohol/week: 0.0 standard drinks  . Drug use: No    Allergies as of 10/06/2019 - Review Complete 10/06/2019  Allergen Reaction Noted  . Goat-derived products Swelling 10/22/2017  . Other Swelling 10/22/2017  . Adhesive [tape] Rash 10/19/2017    Review of Systems:    All systems reviewed and negative except where noted in HPI.   Observations/Objective:  Labs: CBC    Component Value Date/Time   WBC 10.2 11/11/2017 0523   RBC 3.14 (L) 11/11/2017 0523   HGB 9.0 (L) 11/11/2017 0523   HCT 26.8 (L) 11/11/2017 0523   PLT 317 11/11/2017 0523   MCV 85.4 11/11/2017 0523   MCH 28.6 11/11/2017 0523   MCHC 33.5 11/11/2017 0523   RDW 14.1 11/11/2017 0523    LYMPHSABS 0.6 (L) 11/10/2017 0524   MONOABS 0.7 11/10/2017 0524   EOSABS 0.0 11/10/2017 0524   BASOSABS 0.2 (H) 11/10/2017 0524   CMP     Component Value Date/Time   NA 139 11/11/2017 0523   K 4.1 11/11/2017 0523   CL 106 11/11/2017 0523   CO2 25 11/11/2017 0523   GLUCOSE 105 (H) 11/11/2017 0523   BUN 7 11/11/2017 0523   CREATININE 0.63 11/11/2017 0523   CALCIUM 8.0 (L) 11/11/2017 0523   PROT 6.1 (L) 11/09/2017 0327   ALBUMIN 2.9 (L) 11/09/2017 0327   AST 27 11/09/2017 0327   ALT 17 11/09/2017 0327   ALKPHOS 95 11/09/2017 0327   BILITOT 0.7 11/09/2017 0327   GFRNONAA >60 11/11/2017 0523   GFRAA >60 11/11/2017 0523    Imaging Studies: No results found.  Assessment and Plan:   Jill Rivas is a 49 y.o. y/o female has been referred for family history of colon cancer and colorectal cancer screening  Assessment and Plan: Colonoscopy indicated for family history of colon cancer will need a colonoscopy every 5 years at least  We will repeat liver enzymes due to mildly elevated alk phos noted in December 2020  Due to the current COVID-19 pandemic, current elective procedures are being put on a wait list due to limited availability and scheduling.  Patient verbalized understanding of this   Follow Up Instructions: 3 to 4 months  I discussed the assessment and treatment plan with the patient. The patient was provided an opportunity to ask questions and all were answered. The patient agreed with the plan and demonstrated an understanding of the instructions.   The patient was advised to call back or seek an in-person evaluation if the symptoms worsen or if the condition fails to improve as anticipated.  I provided 15 minutes of face-to-face time via video software during this encounter.  Additional time was spent in reviewing patient's chart, placing orders etc.   Virgel Manifold, MD  Speech recognition software was used to dictate the above note.

## 2019-10-12 LAB — HEPATIC FUNCTION PANEL
ALT: 74 IU/L — ABNORMAL HIGH (ref 0–32)
AST: 35 IU/L (ref 0–40)
Albumin: 4.6 g/dL (ref 3.8–4.8)
Alkaline Phosphatase: 157 IU/L — ABNORMAL HIGH (ref 39–117)
Bilirubin Total: 0.2 mg/dL (ref 0.0–1.2)
Bilirubin, Direct: 0.08 mg/dL (ref 0.00–0.40)
Total Protein: 7 g/dL (ref 6.0–8.5)

## 2019-10-16 ENCOUNTER — Encounter: Payer: Self-pay | Admitting: Gastroenterology

## 2019-10-17 ENCOUNTER — Telehealth: Payer: Self-pay

## 2019-10-17 DIAGNOSIS — R748 Abnormal levels of other serum enzymes: Secondary | ICD-10-CM

## 2019-10-17 DIAGNOSIS — E559 Vitamin D deficiency, unspecified: Secondary | ICD-10-CM

## 2019-10-17 NOTE — Telephone Encounter (Signed)
Per Dr. Anna  Alk phos again elevated, get USG liver, calcium, vitanmin D, GGT, PTH checked, ensure not on any alcohol. F/u with Dr Tahiliani in 10 weeks with repeat LFT's   Patient verbalized understanding of results. Put patient on recall list for 10 weeks. Patient will come and get blood work done   

## 2019-10-20 ENCOUNTER — Other Ambulatory Visit: Payer: Self-pay

## 2019-10-20 ENCOUNTER — Encounter: Payer: Self-pay | Admitting: Gastroenterology

## 2019-10-26 ENCOUNTER — Other Ambulatory Visit
Admission: RE | Admit: 2019-10-26 | Discharge: 2019-10-26 | Disposition: A | Discharge status: Home / Self Care | Payer: Medicare Other | Source: Ambulatory Visit | Attending: Gastroenterology | Admitting: Gastroenterology

## 2019-10-26 ENCOUNTER — Other Ambulatory Visit: Payer: Self-pay | Admitting: Gastroenterology

## 2019-10-26 DIAGNOSIS — Z20822 Contact with and (suspected) exposure to covid-19: Secondary | ICD-10-CM | POA: Insufficient documentation

## 2019-10-26 DIAGNOSIS — Z01812 Encounter for preprocedural laboratory examination: Secondary | ICD-10-CM | POA: Diagnosis present

## 2019-10-26 LAB — SARS CORONAVIRUS 2 (TAT 6-24 HRS): SARS Coronavirus 2: NEGATIVE

## 2019-10-26 NOTE — Anesthesia Preprocedure Evaluation (Addendum)
Anesthesia Evaluation  Patient identified by MRN, date of birth, ID band Patient awake    Reviewed: Allergy & Precautions, NPO status , Patient's Chart, lab work & pertinent test results  History of Anesthesia Complications (+) PONV and history of anesthetic complications  Airway Mallampati: II  TM Distance: >3 FB Neck ROM: Full    Dental   Pulmonary sleep apnea ,    breath sounds clear to auscultation       Cardiovascular hypertension, (-) angina(-) DOE  Rhythm:Regular Rate:Normal     Neuro/Psych  Headaches, PSYCHIATRIC DISORDERS (PTSD) Anxiety Depression  Neuromuscular disease (CRPS RLE)    GI/Hepatic GERD  Controlled,  Endo/Other    Renal/GU Renal disease (Stones)     Musculoskeletal  (+) Arthritis ,  Raynaud's   Abdominal (+) + obese (BMI 31),   Peds  Hematology   Anesthesia Other Findings   Reproductive/Obstetrics                           Anesthesia Physical Anesthesia Plan  ASA: II  Anesthesia Plan: General   Post-op Pain Management:    Induction: Intravenous  PONV Risk Score and Plan: 3 and Propofol infusion, TIVA, Treatment may vary due to age or medical condition and Ondansetron  Airway Management Planned: Natural Airway and Nasal Cannula  Additional Equipment:   Intra-op Plan:   Post-operative Plan:   Informed Consent: I have reviewed the patients History and Physical, chart, labs and discussed the procedure including the risks, benefits and alternatives for the proposed anesthesia with the patient or authorized representative who has indicated his/her understanding and acceptance.       Plan Discussed with: CRNA and Anesthesiologist  Anesthesia Plan Comments:        Anesthesia Quick Evaluation

## 2019-10-27 ENCOUNTER — Telehealth: Payer: Self-pay

## 2019-10-27 DIAGNOSIS — R748 Abnormal levels of other serum enzymes: Secondary | ICD-10-CM

## 2019-10-27 LAB — HEPATIC FUNCTION PANEL
ALT: 22 IU/L (ref 0–32)
AST: 17 IU/L (ref 0–40)
Albumin: 4.4 g/dL (ref 3.8–4.8)
Alkaline Phosphatase: 122 IU/L — ABNORMAL HIGH (ref 39–117)
Bilirubin Total: 0.2 mg/dL (ref 0.0–1.2)
Bilirubin, Direct: 0.1 mg/dL (ref 0.00–0.40)
Total Protein: 6.9 g/dL (ref 6.0–8.5)

## 2019-10-27 NOTE — Telephone Encounter (Signed)
Patient states she will go get the lab works done

## 2019-10-27 NOTE — Telephone Encounter (Signed)
-----   Message from Lin Landsman, MD sent at 10/27/2019 11:54 AM EST ----- Recommend to check alkaline phosphatase isoenzymes and serum GGT before proceeding with further work-up  RV

## 2019-10-27 NOTE — Discharge Instructions (Signed)
General Anesthesia, Adult, Care After This sheet gives you information about how to care for yourself after your procedure. Your health care provider may also give you more specific instructions. If you have problems or questions, contact your health care provider. What can I expect after the procedure? After the procedure, the following side effects are common:  Pain or discomfort at the IV site.  Nausea.  Vomiting.  Sore throat.  Trouble concentrating.  Feeling cold or chills.  Weak or tired.  Sleepiness and fatigue.  Soreness and body aches. These side effects can affect parts of the body that were not involved in surgery. Follow these instructions at home:  For at least 24 hours after the procedure:  Have a responsible adult stay with you. It is important to have someone help care for you until you are awake and alert.  Rest as needed.  Do not: ? Participate in activities in which you could fall or become injured. ? Drive. ? Use heavy machinery. ? Drink alcohol. ? Take sleeping pills or medicines that cause drowsiness. ? Make important decisions or sign legal documents. ? Take care of children on your own. Eating and drinking  Follow any instructions from your health care provider about eating or drinking restrictions.  When you feel hungry, start by eating small amounts of foods that are soft and easy to digest (bland), such as toast. Gradually return to your regular diet.  Drink enough fluid to keep your urine pale yellow.  If you vomit, rehydrate by drinking water, juice, or clear broth. General instructions  If you have sleep apnea, surgery and certain medicines can increase your risk for breathing problems. Follow instructions from your health care provider about wearing your sleep device: ? Anytime you are sleeping, including during daytime naps. ? While taking prescription pain medicines, sleeping medicines, or medicines that make you drowsy.  Return to  your normal activities as told by your health care provider. Ask your health care provider what activities are safe for you.  Take over-the-counter and prescription medicines only as told by your health care provider.  If you smoke, do not smoke without supervision.  Keep all follow-up visits as told by your health care provider. This is important. Contact a health care provider if:  You have nausea or vomiting that does not get better with medicine.  You cannot eat or drink without vomiting.  You have pain that does not get better with medicine.  You are unable to pass urine.  You develop a skin rash.  You have a fever.  You have redness around your IV site that gets worse. Get help right away if:  You have difficulty breathing.  You have chest pain.  You have blood in your urine or stool, or you vomit blood. Summary  After the procedure, it is common to have a sore throat or nausea. It is also common to feel tired.  Have a responsible adult stay with you for the first 24 hours after general anesthesia. It is important to have someone help care for you until you are awake and alert.  When you feel hungry, start by eating small amounts of foods that are soft and easy to digest (bland), such as toast. Gradually return to your regular diet.  Drink enough fluid to keep your urine pale yellow.  Return to your normal activities as told by your health care provider. Ask your health care provider what activities are safe for you. This information is not   intended to replace advice given to you by your health care provider. Make sure you discuss any questions you have with your health care provider. Document Revised: 09/11/2017 Document Reviewed: 04/24/2017 Elsevier Patient Education  2020 [employer]  

## 2019-10-28 ENCOUNTER — Other Ambulatory Visit: Payer: Self-pay

## 2019-10-28 ENCOUNTER — Encounter
Admission: RE | Disposition: A | Discharge status: Home / Self Care | Payer: Self-pay | Source: Home / Self Care | Attending: Gastroenterology

## 2019-10-28 ENCOUNTER — Ambulatory Visit: Payer: Medicare Other | Admitting: Anesthesiology

## 2019-10-28 ENCOUNTER — Encounter: Payer: Self-pay | Admitting: Gastroenterology

## 2019-10-28 ENCOUNTER — Ambulatory Visit
Admission: RE | Admit: 2019-10-28 | Discharge: 2019-10-28 | Disposition: A | Discharge status: Home / Self Care | Payer: Medicare Other | Attending: Gastroenterology | Admitting: Gastroenterology

## 2019-10-28 DIAGNOSIS — I10 Essential (primary) hypertension: Secondary | ICD-10-CM | POA: Diagnosis not present

## 2019-10-28 DIAGNOSIS — Z1211 Encounter for screening for malignant neoplasm of colon: Secondary | ICD-10-CM | POA: Insufficient documentation

## 2019-10-28 DIAGNOSIS — Z800 Family history of malignant neoplasm of digestive organs: Secondary | ICD-10-CM | POA: Diagnosis not present

## 2019-10-28 DIAGNOSIS — E669 Obesity, unspecified: Secondary | ICD-10-CM | POA: Insufficient documentation

## 2019-10-28 DIAGNOSIS — G4730 Sleep apnea, unspecified: Secondary | ICD-10-CM | POA: Insufficient documentation

## 2019-10-28 DIAGNOSIS — Z79899 Other long term (current) drug therapy: Secondary | ICD-10-CM | POA: Diagnosis not present

## 2019-10-28 DIAGNOSIS — F4310 Post-traumatic stress disorder, unspecified: Secondary | ICD-10-CM | POA: Insufficient documentation

## 2019-10-28 DIAGNOSIS — Z6831 Body mass index (BMI) 31.0-31.9, adult: Secondary | ICD-10-CM | POA: Insufficient documentation

## 2019-10-28 DIAGNOSIS — D120 Benign neoplasm of cecum: Secondary | ICD-10-CM | POA: Insufficient documentation

## 2019-10-28 DIAGNOSIS — K219 Gastro-esophageal reflux disease without esophagitis: Secondary | ICD-10-CM | POA: Insufficient documentation

## 2019-10-28 DIAGNOSIS — Z87442 Personal history of urinary calculi: Secondary | ICD-10-CM | POA: Insufficient documentation

## 2019-10-28 DIAGNOSIS — K635 Polyp of colon: Secondary | ICD-10-CM | POA: Diagnosis not present

## 2019-10-28 DIAGNOSIS — I7300 Raynaud's syndrome without gangrene: Secondary | ICD-10-CM | POA: Diagnosis not present

## 2019-10-28 DIAGNOSIS — G9050 Complex regional pain syndrome I, unspecified: Secondary | ICD-10-CM | POA: Insufficient documentation

## 2019-10-28 HISTORY — PX: POLYPECTOMY: SHX5525

## 2019-10-28 HISTORY — DX: Motion sickness, initial encounter: T75.3XXA

## 2019-10-28 HISTORY — PX: COLONOSCOPY WITH PROPOFOL: SHX5780

## 2019-10-28 HISTORY — DX: Raynaud's syndrome without gangrene: I73.00

## 2019-10-28 SURGERY — COLONOSCOPY WITH PROPOFOL
Anesthesia: General

## 2019-10-28 MED ORDER — STERILE WATER FOR IRRIGATION IR SOLN
Status: DC | PRN
  Administered 2019-10-28: 50 mL

## 2019-10-28 MED ORDER — LACTATED RINGERS IV SOLN
100.0000 mL/h | INTRAVENOUS | Status: DC
  Administered 2019-10-28: 08:00:00 100 mL/h via INTRAVENOUS

## 2019-10-28 MED ORDER — LIDOCAINE HCL (CARDIAC) PF 100 MG/5ML IV SOSY
PREFILLED_SYRINGE | INTRAVENOUS | Status: DC | PRN
  Administered 2019-10-28: 40 mg via INTRAVENOUS

## 2019-10-28 MED ORDER — ONDANSETRON HCL 4 MG/2ML IJ SOLN: 4.0000 mg | Freq: Once | INTRAMUSCULAR | Status: DC | PRN

## 2019-10-28 MED ORDER — PROPOFOL 10 MG/ML IV BOLUS
INTRAVENOUS | Status: DC | PRN
  Administered 2019-10-28: 50 mg via INTRAVENOUS
  Administered 2019-10-28: 30 mg via INTRAVENOUS
  Administered 2019-10-28: 70 mg via INTRAVENOUS
  Administered 2019-10-28: 30 mg via INTRAVENOUS
  Administered 2019-10-28: 70 mg via INTRAVENOUS
  Administered 2019-10-28: 50 mg via INTRAVENOUS

## 2019-10-28 MED ORDER — ACETAMINOPHEN 10 MG/ML IV SOLN: 1000.0000 mg | Freq: Once | INTRAVENOUS | Status: DC | PRN

## 2019-10-28 SURGICAL SUPPLY — 6 items
CANISTER SUCT 1200ML W/VALVE (MISCELLANEOUS) ×3 IMPLANT
GOWN CVR UNV OPN BCK APRN NK (MISCELLANEOUS) ×4 IMPLANT
GOWN ISOL THUMB LOOP REG UNIV (MISCELLANEOUS) ×6
KIT ENDO PROCEDURE OLY (KITS) ×3 IMPLANT
SNARE COLD EXACTO (MISCELLANEOUS) ×1 IMPLANT
TRAP ETRAP POLY (MISCELLANEOUS) ×1 IMPLANT

## 2019-10-28 NOTE — Anesthesia Postprocedure Evaluation (Signed)
Anesthesia Post Note  Patient: Jill Rivas  Procedure(s) Performed: COLONOSCOPY WITH PROPOFOL (N/A )     Patient location during evaluation: PACU Anesthesia Type: General Level of consciousness: awake and alert Pain management: pain level controlled Vital Signs Assessment: post-procedure vital signs reviewed and stable Respiratory status: spontaneous breathing, nonlabored ventilation, respiratory function stable and patient connected to nasal cannula oxygen Cardiovascular status: blood pressure returned to baseline and stable Postop Assessment: no apparent nausea or vomiting Anesthetic complications: no    Annison Birchard A  Leshea Jaggers

## 2019-10-28 NOTE — Transfer of Care (Signed)
Immediate Anesthesia Transfer of Care Note  Patient: Jill Rivas  Procedure(s) Performed: COLONOSCOPY WITH PROPOFOL (N/A )  Patient Location: PACU  Anesthesia Type: General  Level of Consciousness: awake, alert  and patient cooperative  Airway and Oxygen Therapy: Patient Spontanous Breathing and Patient connected to supplemental oxygen  Post-op Assessment: Post-op Vital signs reviewed, Patient's Cardiovascular Status Stable, Respiratory Function Stable, Patent Airway and No signs of Nausea or vomiting  Post-op Vital Signs: Reviewed and stable  Complications: No apparent anesthesia complications

## 2019-10-28 NOTE — Op Note (Signed)
[facility] Patient Name: Jill Rivas Procedure Date: 10/28/2019 9:24 AM MRN: [id number] Account #: [id number] Date of Birth: May 19, 1971 Admit Type: Outpatient Age: 49 Room: [facility] OR ROOM 01 Gender: Female Note Status: Finalized Procedure:             Colonoscopy Indications:           Screening in patient at increased risk: Family history                         of 1st-degree relative with colorectal cancer Providers:             Jonathon Bellows MD, MD Referring MD:          Baltazar Apo, MD (Referring MD) Medicines:             Monitored Anesthesia Care Complications:         No immediate complications. Procedure:             Pre-Anesthesia Assessment:                        - Prior to the procedure, a History and Physical was                         performed, and patient medications, allergies and                         sensitivities were reviewed. The patient's tolerance                         of previous anesthesia was reviewed.                        - The risks and benefits of the procedure and the                         sedation options and risks were discussed with the                         patient. All questions were answered and informed                         consent was obtained.                        - ASA Grade Assessment: II - A patient with mild                         systemic disease.                        After obtaining informed consent, the colonoscope was                         passed under direct vision. Throughout the procedure,                         the patient's blood pressure, pulse, and oxygen                         saturations were monitored  continuously. The was                         introduced through the anus and advanced to the the                         cecum, identified by the appendiceal orifice. The                         colonoscopy was performed with ease. The patient     tolerated the procedure well. The quality of the bowel                         preparation was excellent. Findings:      The perianal and digital rectal examinations were normal.      A 6 mm polyp was found in the cecum. The polyp was sessile. The polyp       was removed with a cold snare. Resection and retrieval were complete.      The exam was otherwise without abnormality on direct and retroflexion       views. Impression:            - One 6 mm polyp in the cecum, removed with a cold                         snare. Resected and retrieved.                        - The examination was otherwise normal on direct and                         retroflexion views. Recommendation:        - Discharge patient to home (with escort).                        - Resume previous diet.                        - Continue present medications.                        - Await pathology results.                        - Repeat colonoscopy for surveillance based on                         pathology results. Procedure Code(s):     --- Professional ---                        [phone], Colonoscopy, flexible; with removal of                         tumor(s), polyp(s), or other lesion(s) by snare                         technique Diagnosis Code(s):     --- Professional ---                        [id number],  Polyp of colon                        Z80.0, Family history of malignant neoplasm of                         digestive organs CPT copyright 2019 American Medical Association. All rights reserved. The codes documented in this report are preliminary and upon coder review may  be revised to meet current compliance requirements. Jonathon Bellows, MD Jonathon Bellows MD, MD 10/28/2019 9:55:11 AM This report has been signed electronically. Number of Addenda: 0 Note Initiated On: 10/28/2019 9:24 AM Scope Withdrawal Time: 0 hours 15 minutes 15 seconds  Total Procedure Duration: 0 hours 18 minutes 50 seconds  Estimated Blood Loss:   Estimated blood loss: none.      [facility]

## 2019-10-28 NOTE — H&P (Signed)
Jill Bellows, MD [address] [place], [place], [place], [address] [address], [place], [place], [place], [address] Phone: [phone]  Fax: [phone]  Primary Care Physician:  [facility]   Pre-Procedure History & Physical: HPI:  Jill Rivas is a 49 y.o. female is here for an colonoscopy.   Past Medical History:  Diagnosis Date  . Anxiety   . Bilateral occipital neuralgia   . Compression fracture of cervical vertebra ([place]) 2018  . CRPS (complex regional pain syndrome type I) 2019  . CRPS (complex regional pain syndrome)   . DDD (degenerative disc disease), thoracolumbar 2019  . Depression   . Dysrhythmia    tacchycardia and bradycardia  . GERD (gastroesophageal reflux disease)   . History of kidney stones   . Hypertension 2019   blood pressure elevated due to anxiety  . Kidney stones   . Migraines [id number]   complicated migraines can go into seizure like activity.  inderal and sleep help.  . Motion sickness    car sick  . PONV (postoperative nausea and vomiting)    some nausea after surgery  . PTSD (post-traumatic stress disorder)   . Raynaud's disease   . Reflex sympathetic dystrophy    left foot  . Reflex sympathetic dystrophy of lower limb    foot  . Sleep apnea 2017   tested again approx 1 month ago and sleep apnea has resolved.    Past Surgical History:  Procedure Laterality Date  . ABDOMINAL HYSTERECTOMY    . AUGMENTATION MAMMAPLASTY Bilateral 2005   SALINE  . BREAST ENHANCEMENT SURGERY Bilateral   . BREAST SURGERY    . CHOLECYSTECTOMY    . FOOT SURGERY Left 1993   to free the nerve in ankle  . 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 LAPAROSCOPY N/A 11/08/2017   Procedure: LAPAROSCOPY DIAGNOSTIC;  Surgeon: Brayton Mars, MD;  Location: [facility] ORS;  Service: Gynecology;  Laterality: N/A;  . REPAIR VAGINAL CUFF N/A 10/26/2017   Procedure: REPAIR VAGINAL CUFF;  Surgeon: Harlin Heys, MD;  Location: [facility] ORS;  Service: Gynecology;  Laterality: N/A;    Prior to Admission medications   Medication Sig Start Date End Date Taking? Authorizing Provider  AMITIZA 24 MCG capsule Take 24 mcg by mouth 2 (two) times daily. 09/29/19  Yes [provider]  bisacodyl (BISACODYL) 5 MG EC tablet Take 2 tablets (10mg ) by mouth the day before your procedure between 1pm-3pm. 10/06/19  Yes Tahiliani, Lennette Bihari, MD  cetirizine (ZYRTEC) 10 MG tablet Take 10 mg by mouth at bedtime.  09/28/17  Yes [provider]  diclofenac sodium (VOLTAREN) 1 % GEL Apply 2-4 g to painful area of skin 4 times per day if tolerated. Patient with history of strain, sprain, and muscle spasms of extremities 05/15/16  Yes Mohammed Kindle, MD  dicyclomine (BENTYL) 20 MG tablet Take 20 mg by mouth 4 (four) times daily as needed. 09/23/19  Yes [provider]  DULoxetine (CYMBALTA) 30 MG capsule Take 30-60 mg by mouth See admin instructions. Take 60 mg by mouth in the morning and 30 mg by mouth at bedtime. 06/23/17  Yes [provider]  eletriptan (RELPAX) 20 MG tablet Take 20 mg by mouth 2 (two) times daily as needed. 09/12/19  Yes [provider]  estradiol (ESTRACE) 1 MG tablet TAKE 1 &  1/2 TABLETS (1.5 MG TOTAL) BY MOUTH DAILY. 07/18/19  Yes Harlin Heys, MD  omeprazole (PRILOSEC) 40 MG capsule TAKE 1 TABLET BY MOUTH DAILY AT BEDTIME FOR REFLUX 02/08/17  Yes [provider]  ondansetron (ZOFRAN-ODT) 4 MG disintegrating tablet Take 4 mg by mouth every 8 (eight) hours as needed. 09/02/19  Yes [provider]  Oxycodone HCl 10 MG TABS Limit 4-6 tablets po daily if tolerated Patient taking differently: Take 10 mg by mouth every 4 (four) hours.  05/15/16  Yes Mohammed Kindle, MD  polyethylene glycol powder (GLYCOLAX/MIRALAX) 17 GM/SCOOP powder Please take 34 grams for 7 days before your prep. Start medication on  10/16/2019 10/06/19  Yes Vonda Antigua B, MD  pravastatin (PRAVACHOL) 20 MG tablet Take 20 mg by mouth at bedtime.   Yes [provider]  prazosin (MINIPRESS) 1 MG capsule Take 1-2 mg by mouth See admin instructions. Take 1 mg by mouth twice daily and 2 mg at bedtime   Yes [provider]  propranolol ER (INDERAL LA) 120 MG 24 hr capsule Take 120 mg by mouth at bedtime. 09/08/19  Yes [provider]  tiZANidine (ZANAFLEX) 4 MG tablet Take 6-8 mg by mouth See admin instructions. Take 6 mg by mouth twice daily and 8 mg at bedtime 06/24/17  Yes [provider]  topiramate (TOPAMAX) 50 MG tablet Limit 1-2 tabs by mouth twice a day if tolerated Patient taking differently: Take 100 mg by mouth 2 (two) times daily.  04/15/16  Yes Mohammed Kindle, MD  triamcinolone (KENALOG) 0.025 % cream APPLY TO AFFECTED AREA TWICE DAILY AS NEEDED FOR ECZEMA 06/22/17  Yes [provider]  ibuprofen (ADVIL,MOTRIN) 200 MG tablet Take 800 mg by mouth daily as needed for moderate pain.    [provider]  mirtazapine (REMERON) 15 MG tablet Take 15 mg by mouth at bedtime.    [provider]    Allergies as of 10/06/2019 - Review Complete 10/06/2019  Allergen Reaction Noted  . Goat-derived products Swelling 10/22/2017  . Other Swelling 10/22/2017  . Adhesive [tape] Rash 10/19/2017    Family History  Problem Relation Age of Onset  . Cancer Mother   . Breast cancer Mother   . Colon cancer Mother   . Colon polyps Mother   . Colon polyps Paternal Aunt     Social History   Socioeconomic History  . Marital status: Married    Spouse name: Not on file  . Number of children: Not on file  . Years of education: Not on file  . Highest education level: Not on file  Occupational History  . Not on file  Tobacco Use  . Smoking status: Never Smoker  . Smokeless tobacco: Never Used  Substance and Sexual Activity  . Alcohol use: No    Alcohol/week: 0.0  standard drinks  . Drug use: No  . Sexual activity: Never    Birth control/protection: Surgical  Other Topics Concern  . Not on file  Social History Narrative  . Not on file   Social Determinants of Health   Financial Resource Strain:   . Difficulty of Paying Living Expenses: Not on file  Food Insecurity:   . Worried About Charity fundraiser in the Last Year: Not on file  . Ran Out of Food in the Last Year: Not on file  Transportation Needs:   . Lack of Transportation (Medical): Not on file  . Lack of Transportation (Non-Medical): Not on file  Physical  Activity:   . Days of Exercise per Week: Not on file  . Minutes of Exercise per Session: Not on file  Stress:   . Feeling of Stress : Not on file  Social Connections:   . Frequency of Communication with Friends and Family: Not on file  . Frequency of Social Gatherings with Friends and Family: Not on file  . Attends Religious Services: Not on file  . Active Member of Clubs or Organizations: Not on file  . Attends Archivist Meetings: Not on file  . Marital Status: Not on file  Intimate Partner Violence:   . Fear of Current or Ex-Partner: Not on file  . Emotionally Abused: Not on file  . Physically Abused: Not on file  . Sexually Abused: Not on file    Review of Systems: See HPI, otherwise negative ROS  Physical Exam: BP 121/80   Pulse 79   Temp 97.9 F (36.6 C) (Temporal)   Ht 5\' 7"  (1.702 m)   Wt 92.1 kg   LMP 09/30/2017 (Approximate)   SpO2 100%   BMI 31.79 kg/m  General:   Alert,  pleasant and cooperative in NAD Head:  Normocephalic and atraumatic. Neck:  Supple; no masses or thyromegaly. Lungs:  Clear throughout to auscultation, normal respiratory effort.    Heart:  +S1, +S2, Regular rate and rhythm, No edema. Abdomen:  Soft, nontender and nondistended. Normal bowel sounds, without guarding, and without rebound.   Neurologic:  Alert and  oriented x4;  grossly normal  neurologically.  Impression/Plan: Jill Rivas is here for an colonoscopy to be performed for Screening colonoscopy mother had colon cancer Risks, benefits, limitations, and alternatives regarding  colonoscopy have been reviewed with the patient.  Questions have been answered.  All parties agreeable.   Jill Bellows, MD  10/28/2019, 8:46 AM

## 2019-10-31 ENCOUNTER — Encounter: Payer: Self-pay | Admitting: *Deleted

## 2019-11-01 ENCOUNTER — Encounter: Payer: Medicare Other | Admitting: Obstetrics and Gynecology

## 2019-11-01 LAB — SURGICAL PATHOLOGY

## 2019-11-09 ENCOUNTER — Other Ambulatory Visit: Payer: Self-pay

## 2019-11-09 ENCOUNTER — Encounter (HOSPITAL_COMMUNITY): Payer: Self-pay | Admitting: *Deleted

## 2019-11-09 ENCOUNTER — Emergency Department (HOSPITAL_COMMUNITY)
Admission: EM | Admit: 2019-11-09 | Discharge: 2019-11-09 | Disposition: A | Discharge status: Home / Self Care | Payer: Medicare Other | Attending: Emergency Medicine | Admitting: Emergency Medicine

## 2019-11-09 DIAGNOSIS — M542 Cervicalgia: Secondary | ICD-10-CM | POA: Insufficient documentation

## 2019-11-09 DIAGNOSIS — R519 Headache, unspecified: Secondary | ICD-10-CM | POA: Insufficient documentation

## 2019-11-09 DIAGNOSIS — R202 Paresthesia of skin: Secondary | ICD-10-CM | POA: Diagnosis not present

## 2019-11-09 DIAGNOSIS — Z5321 Procedure and treatment not carried out due to patient leaving prior to being seen by health care provider: Secondary | ICD-10-CM | POA: Diagnosis not present

## 2019-11-09 NOTE — ED Notes (Signed)
Patient called for room. No answer. ?
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
## 2019-11-12 ENCOUNTER — Encounter: Payer: Self-pay | Admitting: Intensive Care

## 2019-11-12 ENCOUNTER — Emergency Department: Payer: Medicare Other

## 2019-11-12 ENCOUNTER — Other Ambulatory Visit: Payer: Self-pay

## 2019-11-12 ENCOUNTER — Emergency Department
Admission: EM | Admit: 2019-11-12 | Discharge: 2019-11-12 | Disposition: A | Discharge status: Home / Self Care | Payer: Medicare Other | Attending: Emergency Medicine | Admitting: Emergency Medicine

## 2019-11-12 DIAGNOSIS — Z79899 Other long term (current) drug therapy: Secondary | ICD-10-CM | POA: Diagnosis not present

## 2019-11-12 DIAGNOSIS — Y999 Unspecified external cause status: Secondary | ICD-10-CM | POA: Diagnosis not present

## 2019-11-12 DIAGNOSIS — S1980XA Other specified injuries of unspecified part of neck, initial encounter: Secondary | ICD-10-CM | POA: Diagnosis not present

## 2019-11-12 DIAGNOSIS — Y939 Activity, unspecified: Secondary | ICD-10-CM | POA: Insufficient documentation

## 2019-11-12 DIAGNOSIS — Y92019 Unspecified place in single-family (private) house as the place of occurrence of the external cause: Secondary | ICD-10-CM | POA: Diagnosis not present

## 2019-11-12 DIAGNOSIS — I10 Essential (primary) hypertension: Secondary | ICD-10-CM | POA: Insufficient documentation

## 2019-11-12 DIAGNOSIS — F1120 Opioid dependence, uncomplicated: Secondary | ICD-10-CM | POA: Diagnosis not present

## 2019-11-12 DIAGNOSIS — R202 Paresthesia of skin: Secondary | ICD-10-CM | POA: Diagnosis present

## 2019-11-12 LAB — CBC WITH DIFFERENTIAL/PLATELET
Abs Immature Granulocytes: 0.04 10*3/uL (ref 0.00–0.07)
Basophils Absolute: 0.1 10*3/uL (ref 0.0–0.1)
Basophils Relative: 1 %
Eosinophils Absolute: 0.2 10*3/uL (ref 0.0–0.5)
Eosinophils Relative: 3 %
HCT: 42.2 % (ref 36.0–46.0)
Hemoglobin: 13.5 g/dL (ref 12.0–15.0)
Immature Granulocytes: 1 %
Lymphocytes Relative: 30 %
Lymphs Abs: 2.4 10*3/uL (ref 0.7–4.0)
MCH: 28.8 pg (ref 26.0–34.0)
MCHC: 32 g/dL (ref 30.0–36.0)
MCV: 90.2 fL (ref 80.0–100.0)
Monocytes Absolute: 0.5 10*3/uL (ref 0.1–1.0)
Monocytes Relative: 7 %
Neutro Abs: 4.7 10*3/uL (ref 1.7–7.7)
Neutrophils Relative %: 58 %
Platelets: 271 10*3/uL (ref 150–400)
RBC: 4.68 MIL/uL (ref 3.87–5.11)
RDW: 11.8 % (ref 11.5–15.5)
WBC: 7.9 10*3/uL (ref 4.0–10.5)
nRBC: 0 % (ref 0.0–0.2)

## 2019-11-12 LAB — COMPREHENSIVE METABOLIC PANEL
ALT: 18 U/L (ref 0–44)
AST: 17 U/L (ref 15–41)
Albumin: 4.4 g/dL (ref 3.5–5.0)
Alkaline Phosphatase: 103 U/L (ref 38–126)
Anion gap: 10 (ref 5–15)
BUN: 8 mg/dL (ref 6–20)
CO2: 24 mmol/L (ref 22–32)
Calcium: 9.3 mg/dL (ref 8.9–10.3)
Chloride: 106 mmol/L (ref 98–111)
Creatinine, Ser: 0.95 mg/dL (ref 0.44–1.00)
GFR calc Af Amer: >60 mL/min (ref >60)
GFR calc non Af Amer: >60 mL/min (ref >60)
Glucose, Bld: 109 mg/dL — ABNORMAL HIGH (ref 70–99)
Potassium: 3.3 mmol/L — ABNORMAL LOW (ref 3.5–5.1)
Sodium: 140 mmol/L (ref 135–145)
Total Bilirubin: 0.6 mg/dL (ref 0.3–1.2)
Total Protein: 7.9 g/dL (ref 6.5–8.1)

## 2019-11-12 MED ORDER — OXYCODONE HCL 5 MG PO TABS
10.0000 mg | ORAL_TABLET | ORAL | Status: AC
  Administered 2019-11-12: 10 mg via ORAL
  Filled 2019-11-12: qty 2
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
## 2019-11-12 NOTE — ED Notes (Signed)
Pt returned from CT °

## 2019-11-12 NOTE — ED Notes (Signed)
Pt states her pain has not changed, but she does not want anything further for pain.  "I'm ok. This is what is is like everyday."  Pt denies any needs at this time.

## 2019-11-12 NOTE — ED Notes (Signed)
Pt to MRI
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
## 2019-11-12 NOTE — ED Notes (Signed)
Pt back from MRI 
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
## 2019-11-12 NOTE — ED Notes (Signed)
Pt informed we are waiting on CT results. Denies any needs at this time.

## 2019-11-13 ENCOUNTER — Encounter: Payer: Self-pay | Admitting: Gastroenterology

## 2020-02-06 ENCOUNTER — Encounter: Payer: Self-pay | Admitting: *Deleted

## 2020-02-08 ENCOUNTER — Encounter: Payer: Self-pay | Admitting: Internal Medicine

## 2020-02-08 ENCOUNTER — Ambulatory Visit (INDEPENDENT_AMBULATORY_CARE_PROVIDER_SITE_OTHER): Payer: Medicare Other | Admitting: Internal Medicine

## 2020-02-08 ENCOUNTER — Other Ambulatory Visit: Payer: Self-pay

## 2020-02-08 VITALS — BP 100/70 | HR 86 | Ht 67.0 in | Wt 214.0 lb

## 2020-02-08 DIAGNOSIS — R002 Palpitations: Secondary | ICD-10-CM | POA: Diagnosis not present

## 2020-02-08 DIAGNOSIS — I959 Hypotension, unspecified: Secondary | ICD-10-CM | POA: Diagnosis not present

## 2020-02-08 NOTE — Patient Instructions (Signed)
Dr End would like you to try the following recommendations:  - Increase your intake of fluid like water.  - Liberalize you intake of sodium.  - Obtain a blood pressure cuff that takes on the upper arm.   Medication Instructions:  Your physician recommends that you continue on your current medications as directed. Please refer to the Current Medication list given to you today.  *If you need a refill on your cardiac medications before your next appointment, please call your pharmacy*  Follow-Up: At [facility], you and your health needs are our priority.  As part of our continuing mission to provide you with exceptional heart care, we have created designated Provider Care Teams.  These Care Teams include your primary Cardiologist (physician) and Advanced Practice Providers (APPs -  Physician Assistants and Nurse Practitioners) who all work together to provide you with the care you need, when you need it.  We recommend signing up for the patient portal called "MyChart".  Sign up information is provided on this After Visit Summary.  MyChart is used to connect with patients for Virtual Visits (Telemedicine).  Patients are able to view lab/test results, encounter notes, upcoming appointments, etc.  Non-urgent messages can be sent to your provider as well.   To learn more about what you can do with MyChart, go to [email or web address].    Your next appointment:   1 month(s)  The format for your next appointment:   In Person  Provider:    You may see DR Harrell Gave END or one of the following Advanced Practice Providers on your designated Care Team:    Murray Hodgkins, NP  Christell Faith, PA-C  Marrianne Mood, PA-C

## 2020-02-08 NOTE — Progress Notes (Signed)
New Outpatient Visit Date: 02/08/2020  Referring Provider: Denton Lank, MD [address] [place],  [place] [address]  Chief Complaint: Low blood pressure  HPI:  Jill Rivas is a 49 y.o. female who is being seen today for the evaluation of hypotension at the request of Dr. Posey Pronto. She has a history of depression, migraine headaches, reflex sympathetic dystrophy, ray notes disease, hyperlipidemia, and thyroid disease .  The patient reports that she has actually had elevated blood pressures in the past.  Over the last 3 weeks, she has noted her blood pressures to be more labile, intermittently having low blood pressure readings into the 70s/40s.  When her blood pressure readings are consistently low, she feels fatigued but has not had any significant lightheadedness or syncope.  She measures her blood pressure with a wrist cuff, typically on the left.  Due to soft blood pressure reading, propranolol, which was being used to treat palpitations and tachycardia, was recently discontinued.  Jill Rivas remains on prazosin to help with nightmares.  Jill Rivas denies a history of prior cardiac disease.  She reports having undergone a stress test in her 2s and believes it was normal.  She has not had any recent chest pain, shortness of breath, or lightheadedness.  She typically consumes 5 to 6 glasses of iced tea per day.  She continues to feel area of palpitations in which it feels like her heart is racing and pounding.  --------------------------------------------------------------------------------------------------  Cardiovascular History & Procedures: Cardiovascular Problems:  Hypotension  Palpitations  Risk Factors:  Hypertension, hyperlipidemia and family history  Cath/PCI:  None  CV Surgery:  None  EP Procedures and Devices:  None  Non-Invasive Evaluation(s):  TTE (07/11/2017): Normal LV size and wall thickness.  LVEF 60-65%.  Normal RV size and  function.  No significant valvular abnormality.  Recent CV Pertinent Labs: Lab Results  Component Value Date   INR 1.36 11/09/2017   K 3.3 (L) 11/12/2019   MG 1.6 (L) 11/10/2017   BUN 8 11/12/2019   CREATININE 0.95 11/12/2019    --------------------------------------------------------------------------------------------------  Past Medical History:  Diagnosis Date  . Anxiety   . Bilateral occipital neuralgia   . Compression fracture of cervical vertebra ([place]) 2018  . CRPS (complex regional pain syndrome type I) 2019  . CRPS (complex regional pain syndrome)   . DDD (degenerative disc disease), thoracolumbar 2019  . Depression   . Dysrhythmia    tacchycardia and bradycardia  . GERD (gastroesophageal reflux disease)   . History of kidney stones   . Hypertension 2019   blood pressure elevated due to anxiety  . Kidney stones   . Migraines [id number]   complicated migraines can go into seizure like activity.  inderal and sleep help.  . Motion sickness    car sick  . PONV (postoperative nausea and vomiting)    some nausea after surgery  . PTSD (post-traumatic stress disorder)   . Raynaud's disease   . Reflex sympathetic dystrophy    left foot  . Reflex sympathetic dystrophy of lower limb    foot  . Sleep apnea 2017   tested again approx 1 month ago and sleep apnea has resolved.    Past Surgical History:  Procedure Laterality Date  . ABDOMINAL HYSTERECTOMY    . AUGMENTATION MAMMAPLASTY Bilateral 2005   SALINE  . BREAST ENHANCEMENT SURGERY Bilateral   . BREAST SURGERY    . CHOLECYSTECTOMY    . COLONOSCOPY WITH PROPOFOL N/A 10/28/2019   Procedure: COLONOSCOPY  WITH PROPOFOL;  Surgeon: Jonathon Bellows, MD;  Location: [place];  Service: Endoscopy;  Laterality: N/A;  . FOOT SURGERY Left 1993   to free the nerve in ankle  . 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 LAPAROSCOPY N/A 11/08/2017   Procedure:  LAPAROSCOPY DIAGNOSTIC;  Surgeon: Brayton Mars, MD;  Location: [facility];  Service: Gynecology;  Laterality: N/A;  . POLYPECTOMY  10/28/2019   Procedure: POLYPECTOMY;  Surgeon: Jonathon Bellows, MD;  Location: [place];  Service: Endoscopy;;  . REPAIR VAGINAL CUFF N/A 10/26/2017   Procedure: REPAIR VAGINAL CUFF;  Surgeon: Harlin Heys, MD;  Location: [facility];  Service: Gynecology;  Laterality: N/A;    Current Meds  Medication Sig  . cetirizine (ZYRTEC) 10 MG tablet Take 10 mg by mouth at bedtime.   . diclofenac sodium (VOLTAREN) 1 % GEL Apply 2-4 g to painful area of skin 4 times per day if tolerated. Patient with history of strain, sprain, and muscle spasms of extremities  . dicyclomine (BENTYL) 20 MG tablet Take 20 mg by mouth 4 (four) times daily as needed.  . DULoxetine (CYMBALTA) 30 MG capsule Take 30-60 mg by mouth See admin instructions. Take 60 mg by mouth in the morning and 30 mg by mouth at bedtime.  Marland Kitchen estradiol (ESTRACE) 1 MG tablet TAKE 1 & 1/2 TABLETS (1.5 MG TOTAL) BY MOUTH DAILY.  . mirtazapine (REMERON) 15 MG tablet Take 15 mg by mouth at bedtime.  Marland Kitchen omeprazole (PRILOSEC) 40 MG capsule TAKE 1 TABLET BY MOUTH DAILY AT BEDTIME FOR REFLUX  . ondansetron (ZOFRAN-ODT) 4 MG disintegrating tablet Take 4 mg by mouth every 8 (eight) hours as needed.  . Oxycodone HCl 10 MG TABS Limit 4-6 tablets po daily if tolerated (Patient taking differently: Take 10 mg by mouth every 4 (four) hours. )  . polyethylene glycol powder (GLYCOLAX/MIRALAX) 17 GM/SCOOP powder Please take 34 grams for 7 days before your prep. Start medication on 10/16/2019  . pravastatin (PRAVACHOL) 20 MG tablet Take 20 mg by mouth at bedtime.  . prazosin (MINIPRESS) 1 MG capsule Take 1-2 mg by mouth See admin instructions. Take 1 mg by mouth twice daily and 2 mg at bedtime  . tiZANidine (ZANAFLEX) 4 MG tablet Take 6-8 mg by mouth See admin instructions. Take 6 mg by mouth twice daily and 8 mg at bedtime  .  topiramate (TOPAMAX) 50 MG tablet Limit 1-2 tabs by mouth twice a day if tolerated (Patient taking differently: Take 100 mg by mouth 2 (two) times daily. )  . triamcinolone (KENALOG) 0.025 % cream APPLY TO AFFECTED AREA TWICE DAILY AS NEEDED FOR ECZEMA    Allergies: Goat-derived products, Other, and Adhesive [tape]  Social History   Tobacco Use  . Smoking status: Never Smoker  . Smokeless tobacco: Never Used  Substance Use Topics  . Alcohol use: No    Alcohol/week: 0.0 standard drinks  . Drug use: No    Family History  Problem Relation Age of Onset  . Cancer Mother   . Breast cancer Mother   . Colon cancer Mother   . Colon polyps Mother   . Heart attack Mother 52  . Heart attack Father 35  . Colon polyps Paternal Aunt   . Heart attack Paternal Grandfather     Review of Systems: A 12-system review of systems was performed and was negative except as noted in the HPI.  --------------------------------------------------------------------------------------------------  Physical Exam: BP 100/70 (BP Location: Right Arm, Patient Position: Sitting, Cuff Size: Large)   Pulse 86   Ht 5\' 7"  (1.702 m)   Wt 214 lb (97.1 kg)   LMP 09/30/2017 (Approximate)   SpO2 95%   BMI 33.52 kg/m    Repeat BP 100/70  General:  NAD. HEENT: No conjunctival pallor or scleral icterus. Facemask in place. Neck: Supple without lymphadenopathy, thyromegaly, JVD, or HJR. No carotid bruit. Lungs: Normal work of breathing. Clear to auscultation bilaterally without wheezes or crackles. Heart: Regular rate and rhythm without murmurs, rubs, or gallops. Non-displaced PMI. Abd: Bowel sounds present. Soft, NT/ND without hepatosplenomegaly Ext: No lower extremity edema. Radial, PT, and DP pulses are 2+ bilaterally Skin: Warm and dry without rash. Neuro: CNIII-XII intact. Strength and fine-touch sensation intact in upper and lower extremities bilaterally. Psych: Normal mood and affect.  EKG:  NSR without  abnormality.  Lab Results  Component Value Date   WBC 7.9 11/12/2019   HGB 13.5 11/12/2019   HCT 42.2 11/12/2019   MCV 90.2 11/12/2019   PLT 271 11/12/2019    Lab Results  Component Value Date   NA 140 11/12/2019   K 3.3 (L) 11/12/2019   CL 106 11/12/2019   CO2 24 11/12/2019   BUN 8 11/12/2019   CREATININE 0.95 11/12/2019   GLUCOSE 109 (H) 11/12/2019   ALT 18 11/12/2019    No results found for: CHOL, HDL, LDLCALC, LDLDIRECT, TRIG, CHOLHDL   --------------------------------------------------------------------------------------------------  ASSESSMENT AND PLAN: Hypotension: Blood pressure today is low normal on initial check and my manual recheck.  I have reviewed her home recordings, which are somewhat labile.  Given that home blood pressures are being measured with a wrist cuff, I question their accuracy.  It is possible that spasm of the radial artery, given the patient's history of RSD and Raynaud's disease, may be leading to artificially low readings.  I have recommended that Jill Rivas procure a quality blood pressure cuff to measure pressures on her upper arm and to record her readings over the next month.  If blood pressure is truly low, I would advocate for weaning of blood pressure lowering medications such as prazosin and oxycodone.  In the meantime, I have encouraged Jill Rivas to increase her water intake and to wear tight clothing around her thighs.  It would also be reasonable for her to liberalize her sodium intake as long as she does not develop significant edema or hypertension.  Palpitations: Chronic and treated in the past with propranolol.  Given report of soft blood pressures, I think it is reasonable to hold beta-blockers at this time.  I encouraged Jill Rivas to  minimize her caffeine intake.  Follow-up: Return to clinic in 1 month.  Nelva Bush, MD 02/09/2020 8:55 PM

## 2020-02-09 ENCOUNTER — Encounter: Payer: Self-pay | Admitting: Internal Medicine

## 2020-02-09 DIAGNOSIS — R002 Palpitations: Secondary | ICD-10-CM | POA: Insufficient documentation

## 2020-02-09 DIAGNOSIS — I959 Hypotension, unspecified: Secondary | ICD-10-CM | POA: Insufficient documentation

## 2020-03-09 ENCOUNTER — Ambulatory Visit: Payer: Medicare Other | Admitting: Family

## 2020-03-12 ENCOUNTER — Encounter: Payer: Self-pay | Admitting: Family

## 2020-03-18 IMAGING — CR DG THORACIC SPINE 2V
3 series · 3 of 3 positions shown · non-contrast
Comparison: None.

CLINICAL DATA: Blunt trauma 3 days ago with back pain.

EXAM:
THORACIC SPINE 2 VIEWS

[t-spine ap]
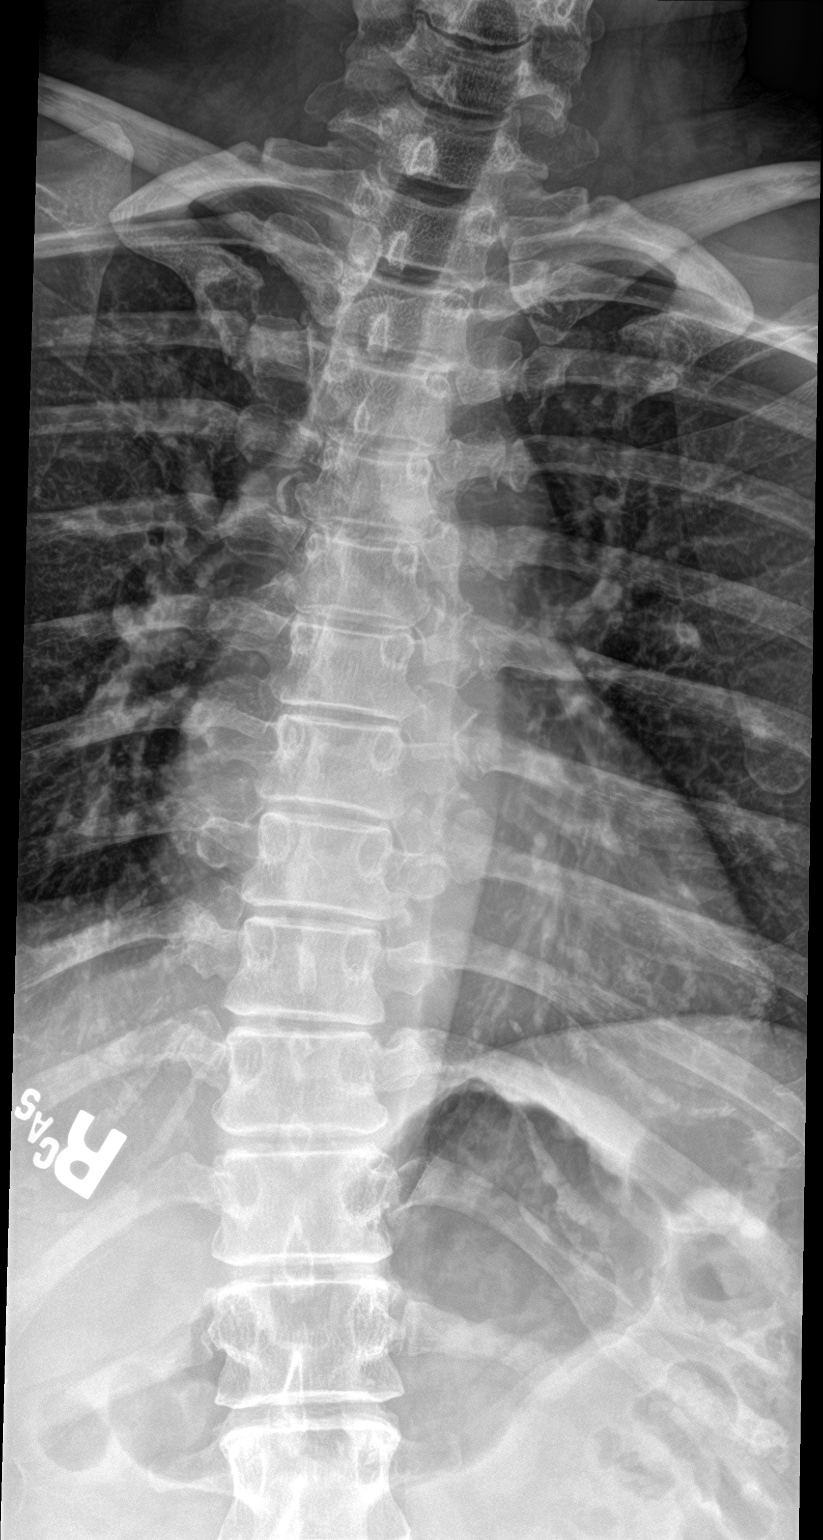

[t-spine lat]
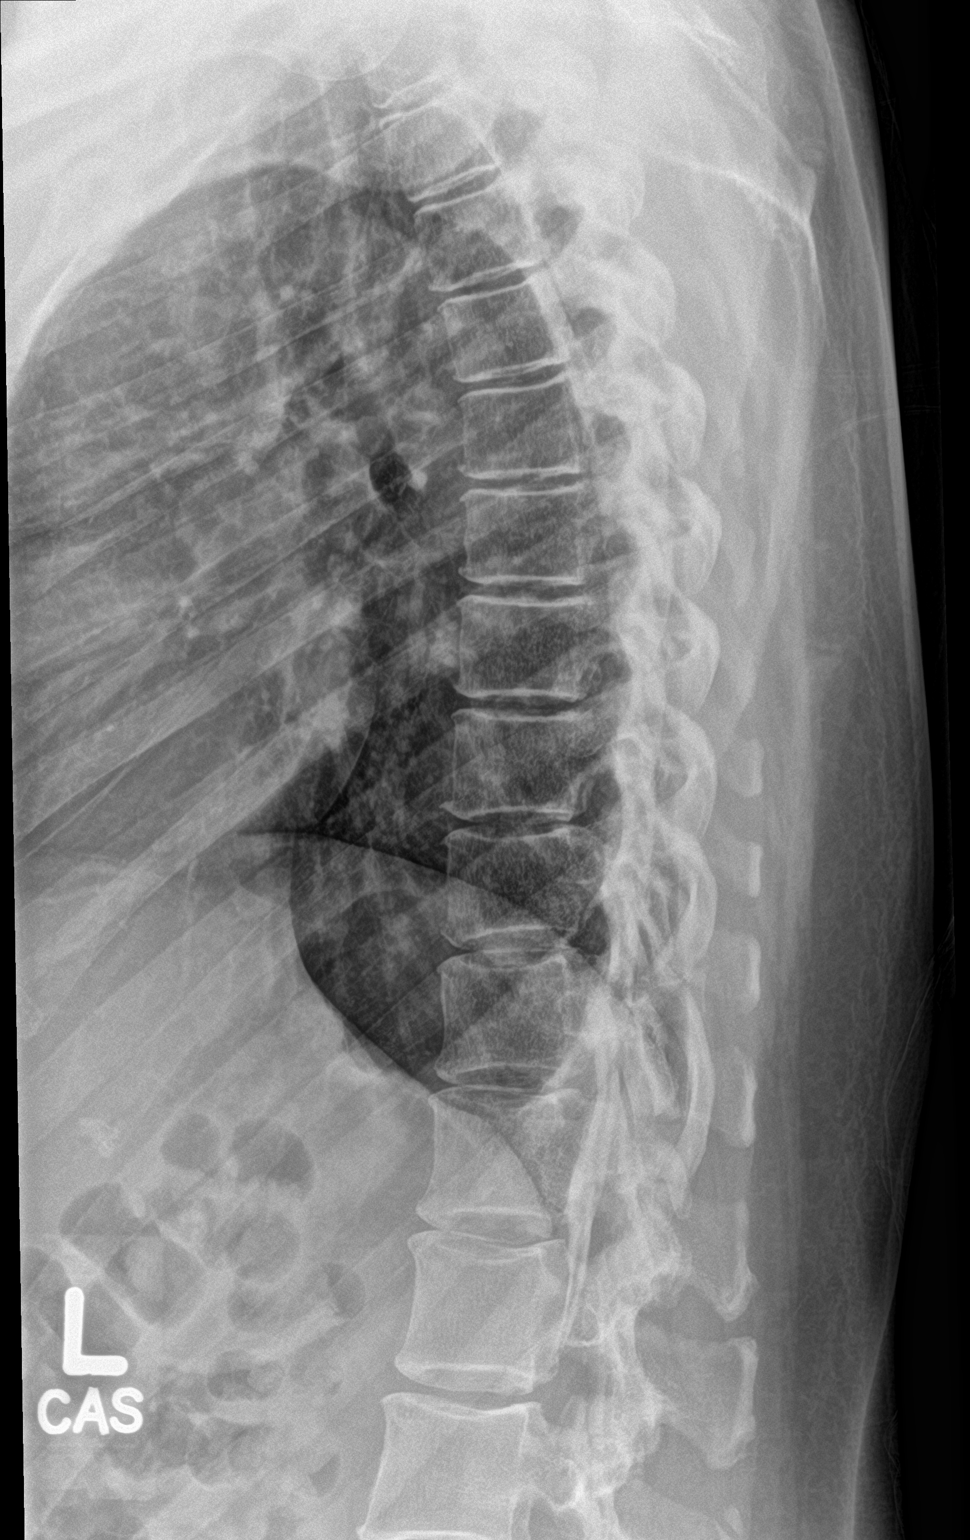

[t-spine swimmers]
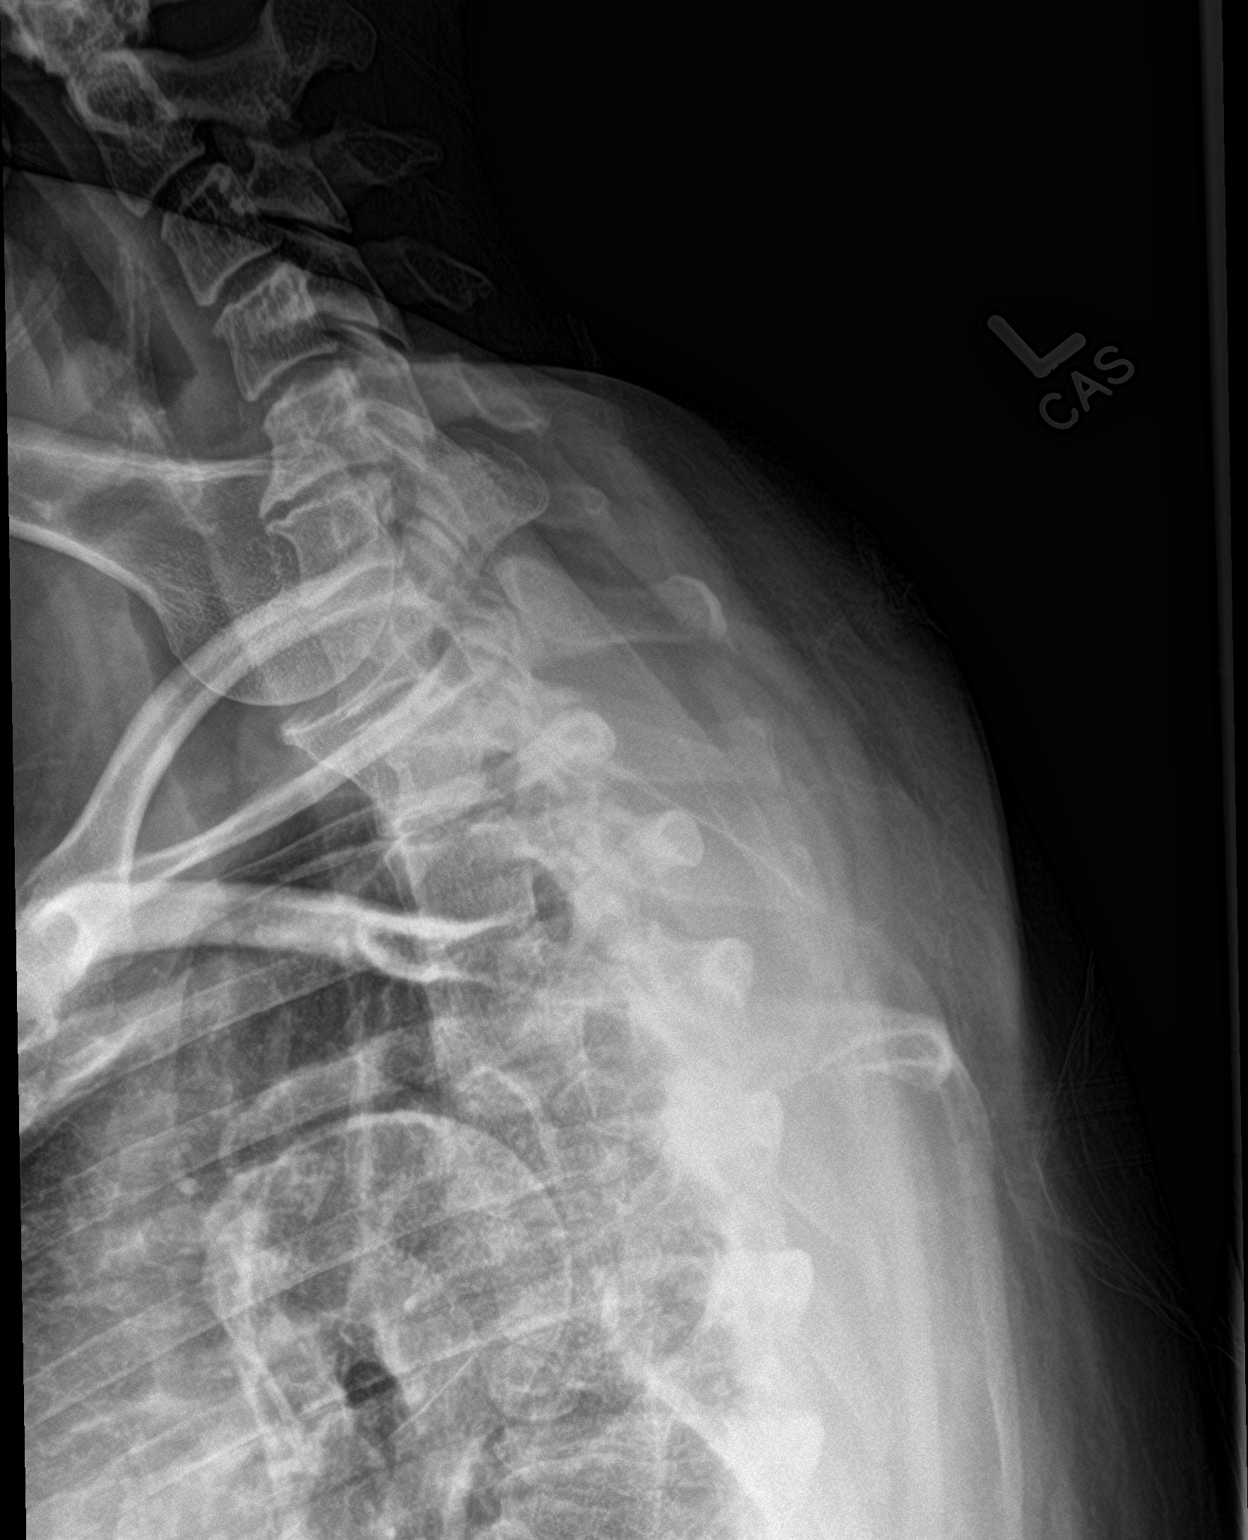

[3 of 3 positions shown; findings below may reference images not displayed]

FINDINGS: There is no evidence of thoracic spine fracture. General thoracic
dextrocurvature is likely positional. Otherwise, alignment is
normal. No other significant bone abnormalities are identified.
IMPRESSION: Negative.

## 2020-03-18 IMAGING — CR DG LUMBAR SPINE 2-3V
3 series · 3 of 3 positions shown · non-contrast
Comparison: None.

CLINICAL DATA: Back pain after trauma 3 days ago.

EXAM:
LUMBAR SPINE - 2-3 VIEW

[l-spine ap]
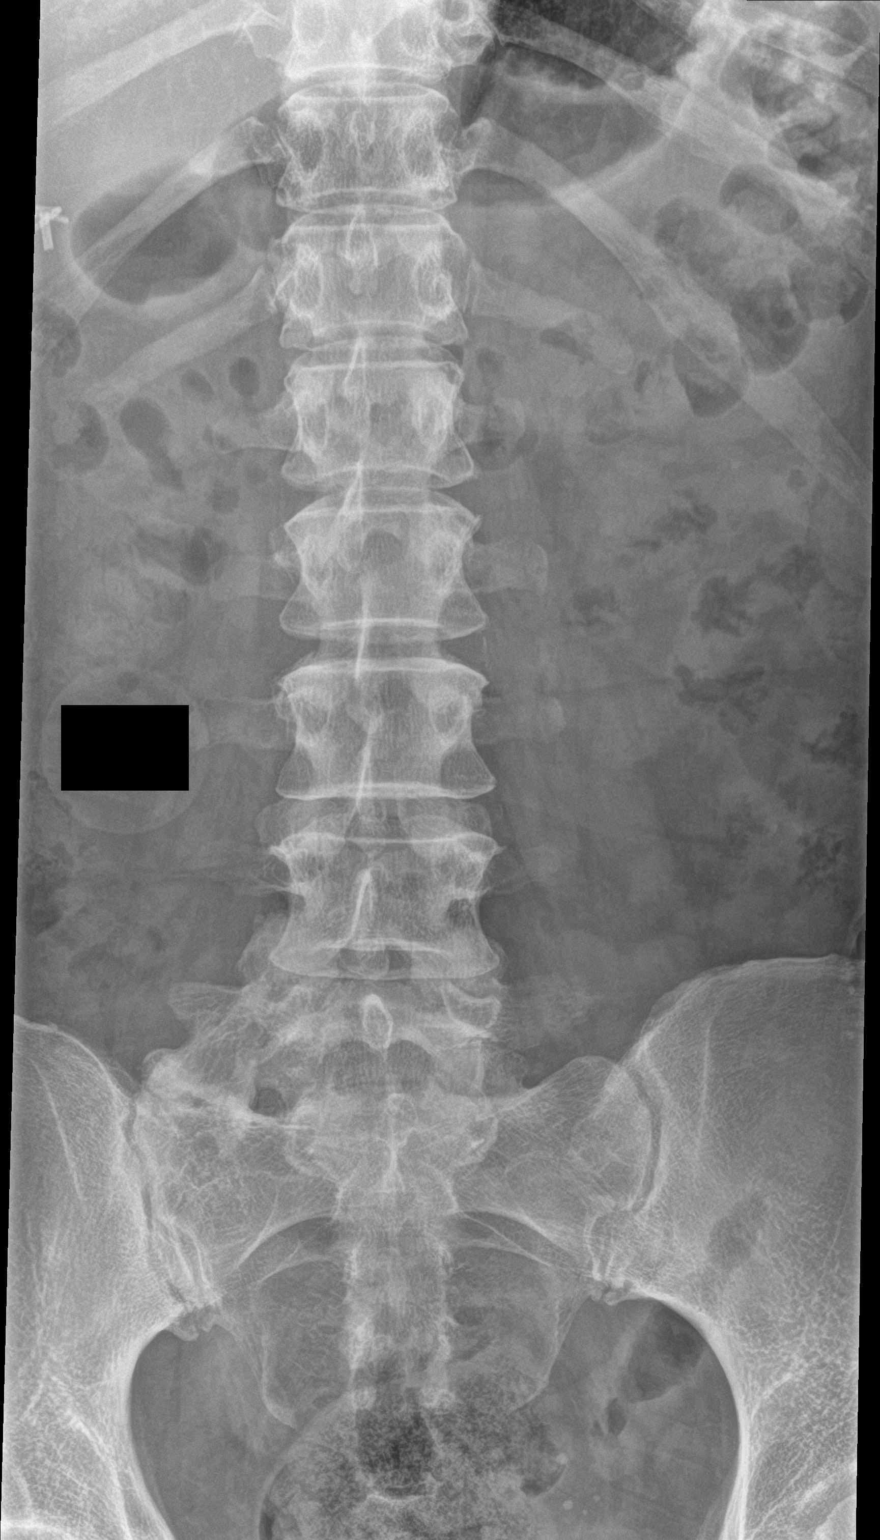

[l-spine lat]
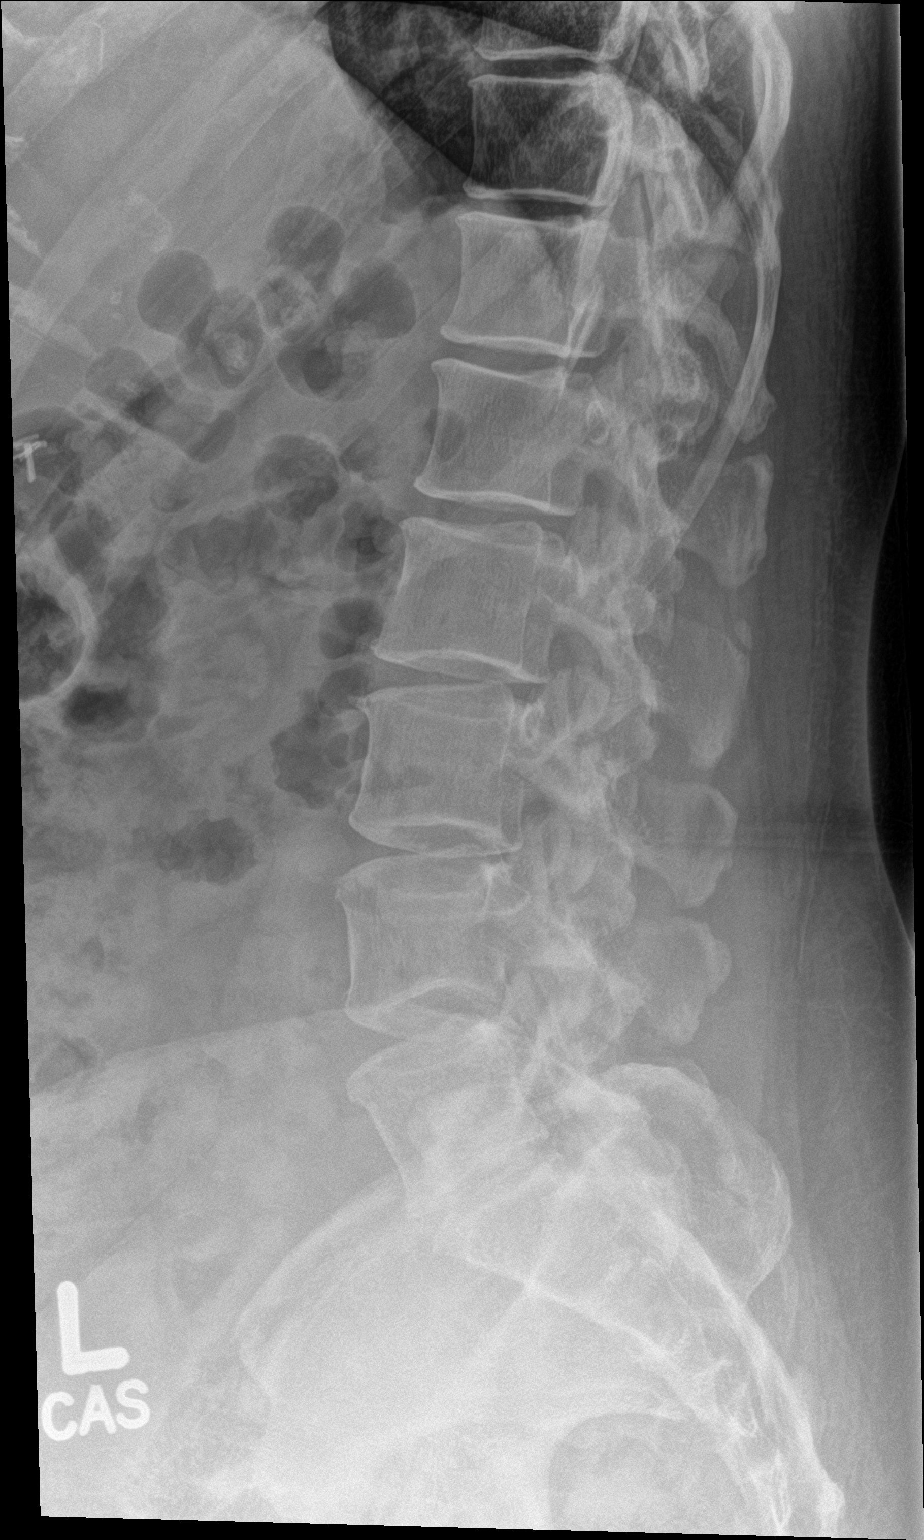

[l-spine spot]
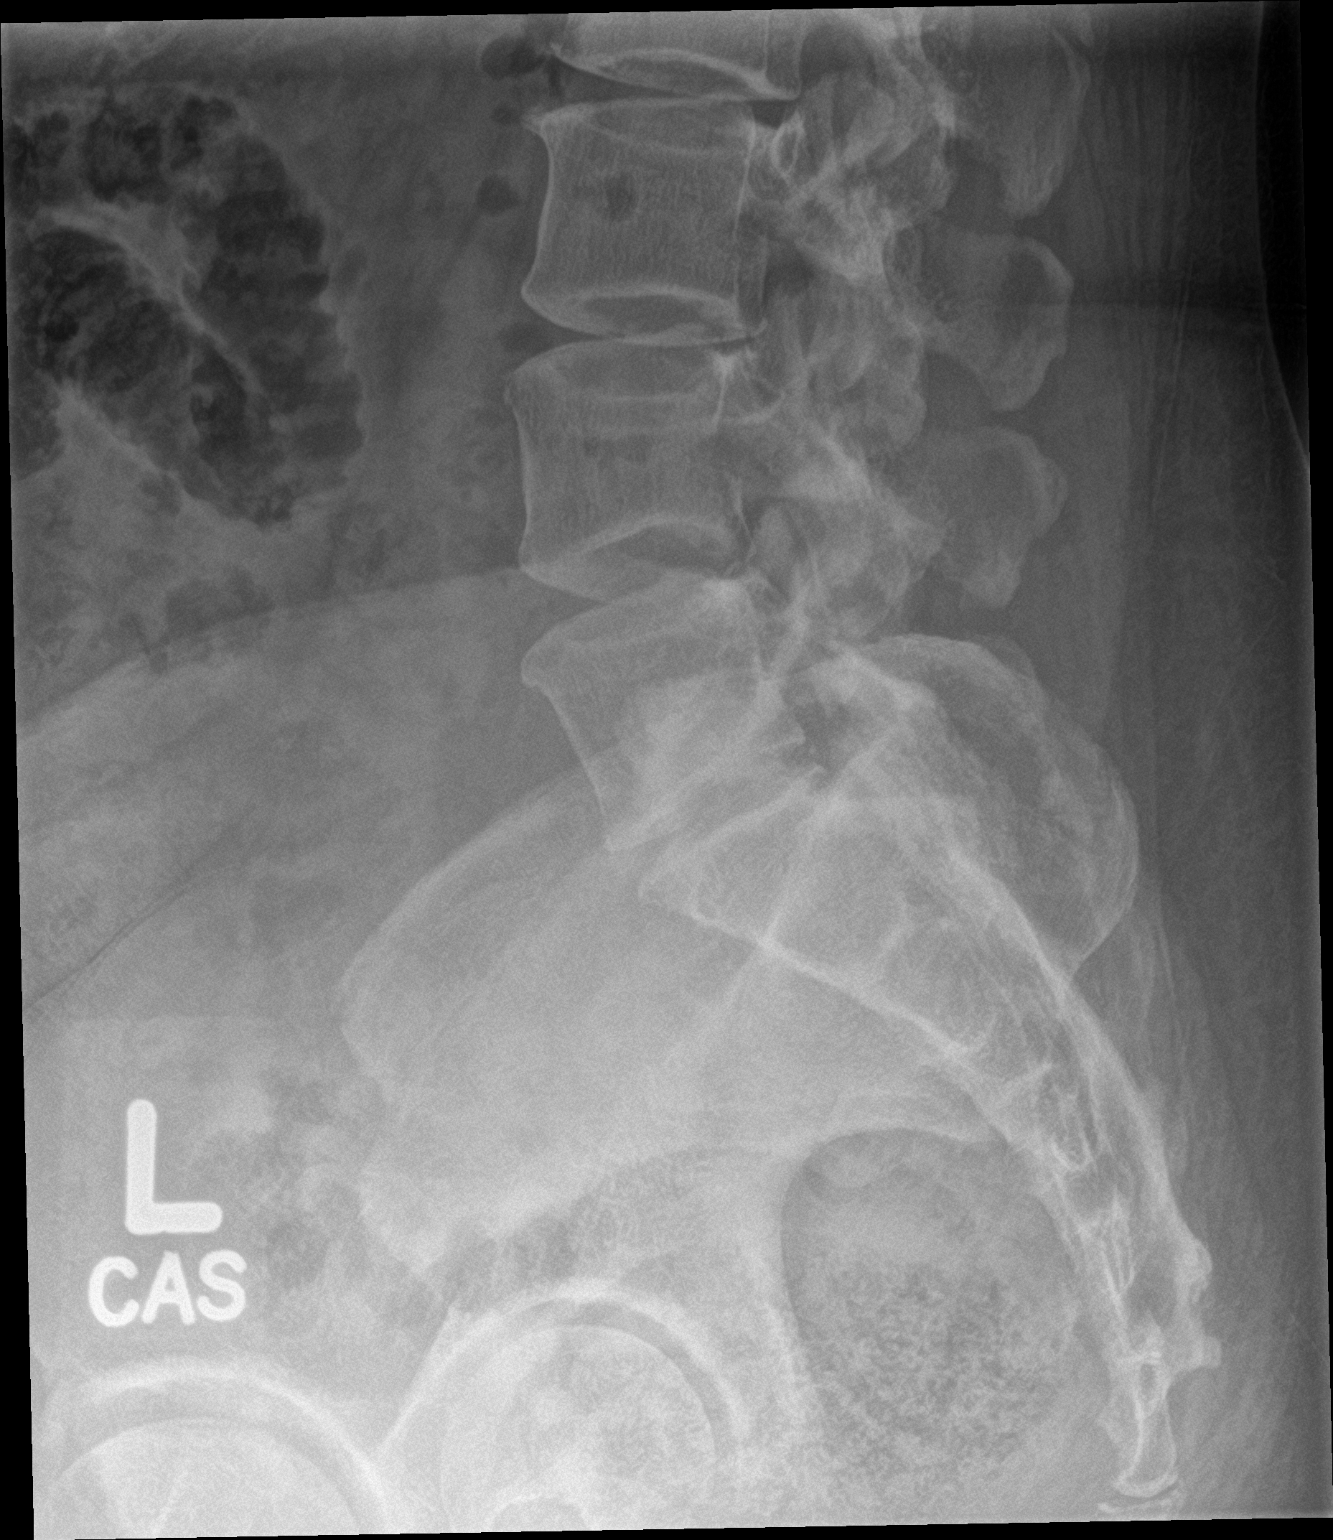

[3 of 3 positions shown; findings below may reference images not displayed]

FINDINGS: There is no evidence of lumbar spine fracture. Alignment is normal.
Intervertebral disc spaces are maintained. There is partial
sacralization of the right aspect of L5.
IMPRESSION: Negative.

## 2020-07-16 ENCOUNTER — Ambulatory Visit
Admission: EM | Admit: 2020-07-16 | Discharge: 2020-07-16 | Disposition: A | Discharge status: Home / Self Care | Payer: Medicare HMO | Attending: Family Medicine | Admitting: Family Medicine

## 2020-07-16 ENCOUNTER — Encounter: Payer: Self-pay | Admitting: *Deleted

## 2020-07-16 DIAGNOSIS — J209 Acute bronchitis, unspecified: Secondary | ICD-10-CM | POA: Diagnosis not present

## 2020-07-16 DIAGNOSIS — R059 Cough, unspecified: Secondary | ICD-10-CM

## 2020-07-16 DIAGNOSIS — R062 Wheezing: Secondary | ICD-10-CM | POA: Diagnosis not present

## 2020-07-16 DIAGNOSIS — J069 Acute upper respiratory infection, unspecified: Secondary | ICD-10-CM | POA: Diagnosis not present

## 2020-07-16 DIAGNOSIS — R5383 Other fatigue: Secondary | ICD-10-CM

## 2020-07-16 MED ORDER — PREDNISONE 10 MG (21) PO TBPK: ORAL_TABLET | Freq: Every day | ORAL | 0 refills | Status: AC

## 2020-07-16 MED ORDER — AZITHROMYCIN 250 MG PO TABS: 250.0000 mg | ORAL_TABLET | Freq: Every day | ORAL | 0 refills | Status: AC

## 2020-07-16 MED ORDER — FLUCONAZOLE 150 MG PO TABS: ORAL_TABLET | ORAL | 0 refills | Status: AC

## 2020-07-16 NOTE — ED Triage Notes (Signed)
Patient reports cough, wheezing, nasal congestion drainage, fatigue, headache x 2 weeks.   Reports negative COVID.   No relief with OTC meds.

## 2020-07-16 NOTE — Discharge Instructions (Addendum)
I have sent in azithromycin for you to take. Take 2 tablets today, then one tablet daily for the next 4 days.  I have sent in a prednisone taper for you to take for 6 days. 6 tablets on day one, 5 tablets on day two, 4 tablets on day three, 3 tablets on day four, 2 tablets on day five, and 1 tablet on day six.  I have sent in fluconazole in case of yeast. Take one tablet at the onset of symptoms, if symptoms are still present in 3 days, take the second tablet.  Follow up with this office or with primary care if symptoms are persisting.  Follow up in the ER for high fever, trouble swallowing, trouble breathing, other concerning symptoms.

## 2020-07-17 NOTE — ED Provider Notes (Signed)
[place]   [id number] 07/16/20 Arrival Time: 1355   SUBJECTIVE:  Jill Rivas is a 49 y.o. female who presents with complaint of nasal congestion, fatigue, chills, post-nasal drainage, and a persistent cough. Onset abrupt approximately 2 weeks ago. Overall fatigued. SOB: mild. Wheezing: moderate. Has negative history of Covid. Has completed Covid vaccines. OTC treatment: OTC cough and cold with no relief. Reports that she has had negative rapid covid testing. Declines covid testing today. Social History   Tobacco Use  Smoking Status Never Smoker  Smokeless Tobacco Never Used    ROS: As per HPI.   OBJECTIVE:  Vitals:   07/16/20 1515  BP: (!) 138/92  Pulse: (!) 115  Resp: 18  Temp: 98.2 F (36.8 C)  TempSrc: Oral  SpO2: 94%     General appearance: alert; no distress HEENT: nasal congestion; clear runny nose; throat irritation secondary to post-nasal drainage Neck: supple without LAD Lungs: diffuse wheezing throughout bilateral lung fields, diminished lung sounds in bilateral bases Skin: warm and dry Psychological: alert and cooperative; normal mood and affect  Results for orders placed or performed during the hospital encounter of 10/28/19  Surgical pathology  Result Value Ref Range   SURGICAL PATHOLOGY      SURGICAL PATHOLOGY CASE: [id number] PATIENT: Myrna Blazer Surgical Pathology Report     Specimen Submitted: A. Colon polyp, cecum; cold snare  Clinical History: Family history of colon cancer      DIAGNOSIS: A. COLON POLYP, CECUM; COLD SNARE: - TUBULAR ADENOMA. - NEGATIVE FOR HIGH-GRADE DYSPLASIA AND MALIGNANCY.   GROSS DESCRIPTION: A. Labeled: Cecum polyp cold snare Received: In formalin Tissue fragment(s): Multiple Size: Aggregate, 0.8 x 0.7 x 0.1 cm Description: Tan soft tissue fragments Entirely submitted in 1 cassette.    Final Diagnosis performed by Betsy Pries, MD.   Electronically  signed 11/01/2019 8:21:01AM The electronic signature indicates that the named Attending Pathologist has evaluated the specimen Technical component performed at [facility], [address] [place], [place] [address] Lab: [phone] Dir: Rush Farmer, MD, MMM  Professional component performed at [facility], [facility], [place], [place], [place] [address] Lab: [phone] Dir: Dellia Nims. Rubinas, MD     Labs Reviewed - No data to display  No results found.  Allergies  Allergen Reactions   Goat-Derived Products Swelling    Swelling and severe itching if near live goats   Other Swelling    COCKROACHES cause throat and lip swelling.   Adhesive [Tape] Rash    Paper tape causes a rash as well    Past Medical History:  Diagnosis Date   Anxiety    Bilateral occipital neuralgia    Compression fracture of cervical vertebra ([place]) 2018   CRPS (complex regional pain syndrome type I) 2019   CRPS (complex regional pain syndrome)    DDD (degenerative disc disease), thoracolumbar 2019   Depression    Dysrhythmia    tacchycardia and bradycardia   GERD (gastroesophageal reflux disease)    History of kidney stones    Hypertension 2019   blood pressure elevated due to anxiety   Kidney stones    Migraines 3382   complicated migraines can go into seizure like activity.  inderal and sleep help.   Motion sickness    car sick   PONV (postoperative nausea and vomiting)    some nausea after surgery   PTSD (post-traumatic stress disorder)    Raynaud's disease    Reflex sympathetic dystrophy    left foot  Reflex sympathetic dystrophy of lower limb    foot   Sleep apnea 2017   tested again approx 1 month ago and sleep apnea has resolved.   Social History   Socioeconomic History   Marital status: Divorced    Spouse name: Not on file   Number of children: Not on file   Years of education: Not on file   Highest education level: Not on file   Occupational History   Not on file  Tobacco Use   Smoking status: Never Smoker   Smokeless tobacco: Never Used  Vaping Use   Vaping Use: Never used  Substance and Sexual Activity   Alcohol use: No    Alcohol/week: 0.0 standard drinks   Drug use: No   Sexual activity: Never    Birth control/protection: Surgical  Other Topics Concern   Not on file  Social History Narrative   Not on file   Social Determinants of Health   Financial Resource Strain:    Difficulty of Paying Living Expenses: Not on file  Food Insecurity:    Worried About Charity fundraiser in the Last Year: Not on file   [employer] of Food in the Last Year: Not on file  Transportation Needs:    Lack of Transportation (Medical): Not on file   Lack of Transportation (Non-Medical): Not on file  Physical Activity:    Days of Exercise per Week: Not on file   Minutes of Exercise per Session: Not on file  Stress:    Feeling of Stress : Not on file  Social Connections:    Frequency of Communication with Friends and Family: Not on file   Frequency of Social Gatherings with Friends and Family: Not on file   Attends Religious Services: Not on file   Active Member of Clubs or Organizations: Not on file   Attends Archivist Meetings: Not on file   Marital Status: Not on file  Intimate Partner Violence:    Fear of Current or Ex-Partner: Not on file   Emotionally Abused: Not on file   Physically Abused: Not on file   Sexually Abused: Not on file   Family History  Problem Relation Age of Onset   Cancer Mother    Breast cancer Mother    Colon cancer Mother    Colon polyps Mother    Heart attack Mother 36   Heart attack Father 22   Colon polyps Paternal Aunt    Heart attack Paternal Grandfather      ASSESSMENT & PLAN:  1. Upper respiratory tract infection, unspecified type   2. Acute bronchitis, unspecified organism   3. Cough   4. Wheezing   5. Other fatigue      Meds ordered this encounter  Medications   azithromycin (ZITHROMAX) 250 MG tablet    Sig: Take 1 tablet (250 mg total) by mouth daily. Take first 2 tablets together, then 1 every day until finished.    Dispense:  6 tablet    Refill:  0    Order Specific Question:   Supervising Provider    Answer:   Chase Picket [[id number]]   predniSONE (STERAPRED UNI-PAK 21 TAB) 10 MG (21) TBPK tablet    Sig: Take by mouth daily for 6 days. Take 6 tablets on day 1, 5 tablets on day 2, 4 tablets on day 3, 3 tablets on day 4, 2 tablets on day 5, 1 tablet on day 6    Dispense:  21 tablet  Refill:  0    Order Specific Question:   Supervising Provider    Answer:   Chase Picket [id number]   fluconazole (DIFLUCAN) 150 MG tablet    Sig: Take one tablet at the onset of symptoms. If in 3 days symptoms are still present, take the second tablet.    Dispense:  2 tablet    Refill:  0    Order Specific Question:   Supervising Provider    Answer:   Chase Picket [[id number]]   URI Bronchitits Prescribed azithromycin Prescribed steroid taper Prescribed fluconazole in case of yeast OTC symptom care as needed. Will plan f/u with PCP or here as needed.  Reviewed expectations re: course of current medical issues. Questions answered. Outlined signs and symptoms indicating need for more acute intervention. Patient verbalized understanding. After Visit Summary given.           Faustino Congress, NP 07/17/20 [phone]

## 2021-01-29 ENCOUNTER — Other Ambulatory Visit: Payer: Self-pay | Admitting: Family Medicine

## 2021-01-29 DIAGNOSIS — Z1231 Encounter for screening mammogram for malignant neoplasm of breast: Secondary | ICD-10-CM

## 2021-05-14 ENCOUNTER — Other Ambulatory Visit: Payer: Self-pay

## 2021-05-14 ENCOUNTER — Ambulatory Visit
Admission: RE | Admit: 2021-05-14 | Discharge: 2021-05-14 | Disposition: A | Discharge status: Home / Self Care | Payer: Medicare HMO | Source: Ambulatory Visit | Attending: Family Medicine | Admitting: Family Medicine

## 2021-05-14 DIAGNOSIS — Z1231 Encounter for screening mammogram for malignant neoplasm of breast: Secondary | ICD-10-CM | POA: Insufficient documentation

## 2022-10-01 ENCOUNTER — Other Ambulatory Visit: Payer: Self-pay | Admitting: Anesthesiology

## 2022-10-01 DIAGNOSIS — M545 Low back pain, unspecified: Secondary | ICD-10-CM

## 2022-10-01 DIAGNOSIS — G90522 Complex regional pain syndrome I of left lower limb: Secondary | ICD-10-CM

## 2022-10-07 ENCOUNTER — Ambulatory Visit: Admission: RE | Admit: 2022-10-07 | Payer: Medicare HMO | Source: Ambulatory Visit

## 2022-10-07 ENCOUNTER — Ambulatory Visit: Payer: Medicare HMO

## 2022-10-14 ENCOUNTER — Ambulatory Visit: Payer: Medicare HMO

## 2022-10-16 ENCOUNTER — Other Ambulatory Visit: Payer: Self-pay | Admitting: Anesthesiology

## 2022-10-16 ENCOUNTER — Ambulatory Visit: Payer: Medicare HMO

## 2022-10-16 DIAGNOSIS — G90522 Complex regional pain syndrome I of left lower limb: Secondary | ICD-10-CM

## 2022-10-24 ENCOUNTER — Ambulatory Visit
Admission: RE | Admit: 2022-10-24 | Discharge: 2022-10-24 | Disposition: A | Discharge status: Home / Self Care | Payer: Medicare HMO | Source: Ambulatory Visit | Attending: Anesthesiology | Admitting: Anesthesiology

## 2022-10-24 DIAGNOSIS — G90522 Complex regional pain syndrome I of left lower limb: Secondary | ICD-10-CM | POA: Diagnosis present

## 2022-10-24 DIAGNOSIS — M545 Low back pain, unspecified: Secondary | ICD-10-CM | POA: Insufficient documentation

## 2022-10-24 MED ORDER — GADOBUTROL 1 MMOL/ML IV SOLN
10.0000 mL | Freq: Once | INTRAVENOUS | Status: AC | PRN
  Administered 2022-10-24: 10 mL via INTRAVENOUS

## 2022-11-03 ENCOUNTER — Other Ambulatory Visit: Payer: Self-pay | Admitting: Anesthesiology

## 2022-11-03 ENCOUNTER — Encounter: Payer: Self-pay | Admitting: Anesthesiology

## 2022-11-03 DIAGNOSIS — G8921 Chronic pain due to trauma: Secondary | ICD-10-CM

## 2022-11-03 DIAGNOSIS — G894 Chronic pain syndrome: Secondary | ICD-10-CM

## 2022-11-03 DIAGNOSIS — G90522 Complex regional pain syndrome I of left lower limb: Secondary | ICD-10-CM

## 2022-11-11 ENCOUNTER — Ambulatory Visit
Admission: RE | Admit: 2022-11-11 | Discharge: 2022-11-11 | Disposition: A | Discharge status: Home / Self Care | Payer: Medicare HMO | Source: Ambulatory Visit | Attending: Anesthesiology | Admitting: Anesthesiology

## 2022-11-11 DIAGNOSIS — G8921 Chronic pain due to trauma: Secondary | ICD-10-CM

## 2022-11-11 DIAGNOSIS — G90522 Complex regional pain syndrome I of left lower limb: Secondary | ICD-10-CM | POA: Insufficient documentation

## 2022-11-11 DIAGNOSIS — G894 Chronic pain syndrome: Secondary | ICD-10-CM

## 2024-06-08 ENCOUNTER — Other Ambulatory Visit: Payer: Self-pay | Admitting: Family Medicine

## 2024-06-08 DIAGNOSIS — Z1231 Encounter for screening mammogram for malignant neoplasm of breast: Secondary | ICD-10-CM
